# Patient Record
Sex: Female | Born: 1945 | Race: White | Hispanic: No | Marital: Married | Smoking: Never smoker
Health system: Southern US, Community
[De-identification: ages and names within clinical notes are randomized; demographics above are authoritative.]

## PROBLEM LIST (undated history)

## (undated) DIAGNOSIS — K5730 Diverticulosis of large intestine without perforation or abscess without bleeding: Secondary | ICD-10-CM

## (undated) DIAGNOSIS — K219 Gastro-esophageal reflux disease without esophagitis: Secondary | ICD-10-CM

## (undated) DIAGNOSIS — C50919 Malignant neoplasm of unspecified site of unspecified female breast: Secondary | ICD-10-CM

## (undated) DIAGNOSIS — L309 Dermatitis, unspecified: Secondary | ICD-10-CM

## (undated) DIAGNOSIS — E785 Hyperlipidemia, unspecified: Secondary | ICD-10-CM

## (undated) DIAGNOSIS — N813 Complete uterovaginal prolapse: Secondary | ICD-10-CM

## (undated) DIAGNOSIS — J3800 Paralysis of vocal cords and larynx, unspecified: Secondary | ICD-10-CM

## (undated) DIAGNOSIS — K5710 Diverticulosis of small intestine without perforation or abscess without bleeding: Secondary | ICD-10-CM

## (undated) DIAGNOSIS — M1990 Unspecified osteoarthritis, unspecified site: Secondary | ICD-10-CM

## (undated) DIAGNOSIS — Z87442 Personal history of urinary calculi: Secondary | ICD-10-CM

## (undated) DIAGNOSIS — N8110 Cystocele, unspecified: Secondary | ICD-10-CM

## (undated) DIAGNOSIS — J302 Other seasonal allergic rhinitis: Secondary | ICD-10-CM

## (undated) DIAGNOSIS — F411 Generalized anxiety disorder: Secondary | ICD-10-CM

## (undated) DIAGNOSIS — M419 Scoliosis, unspecified: Secondary | ICD-10-CM

## (undated) DIAGNOSIS — F32A Depression, unspecified: Secondary | ICD-10-CM

## (undated) DIAGNOSIS — F410 Panic disorder [episodic paroxysmal anxiety] without agoraphobia: Secondary | ICD-10-CM

## (undated) DIAGNOSIS — K5790 Diverticulosis of intestine, part unspecified, without perforation or abscess without bleeding: Secondary | ICD-10-CM

## (undated) DIAGNOSIS — E039 Hypothyroidism, unspecified: Secondary | ICD-10-CM

## (undated) DIAGNOSIS — H18453 Nodular corneal degeneration, bilateral: Secondary | ICD-10-CM

## (undated) DIAGNOSIS — C801 Malignant (primary) neoplasm, unspecified: Secondary | ICD-10-CM

## (undated) DIAGNOSIS — Z860101 Personal history of adenomatous and serrated colon polyps: Secondary | ICD-10-CM

## (undated) DIAGNOSIS — K589 Irritable bowel syndrome without diarrhea: Secondary | ICD-10-CM

## (undated) DIAGNOSIS — Z8719 Personal history of other diseases of the digestive system: Secondary | ICD-10-CM

## (undated) DIAGNOSIS — H04123 Dry eye syndrome of bilateral lacrimal glands: Secondary | ICD-10-CM

## (undated) DIAGNOSIS — I10 Essential (primary) hypertension: Secondary | ICD-10-CM

## (undated) DIAGNOSIS — N200 Calculus of kidney: Secondary | ICD-10-CM

## (undated) DIAGNOSIS — J309 Allergic rhinitis, unspecified: Secondary | ICD-10-CM

## (undated) DIAGNOSIS — D126 Benign neoplasm of colon, unspecified: Secondary | ICD-10-CM

## (undated) DIAGNOSIS — K222 Esophageal obstruction: Secondary | ICD-10-CM

## (undated) DIAGNOSIS — F329 Major depressive disorder, single episode, unspecified: Secondary | ICD-10-CM

## (undated) HISTORY — PX: DILATION AND CURETTAGE OF UTERUS: SHX78

## (undated) HISTORY — DX: Essential (primary) hypertension: I10

## (undated) HISTORY — PX: TONSILLECTOMY: SUR1361

## (undated) HISTORY — DX: Dermatitis, unspecified: L30.9

## (undated) HISTORY — DX: Benign neoplasm of colon, unspecified: D12.6

## (undated) HISTORY — DX: Unspecified osteoarthritis, unspecified site: M19.90

## (undated) HISTORY — DX: Diverticulosis of intestine, part unspecified, without perforation or abscess without bleeding: K57.90

## (undated) HISTORY — PX: TUBAL LIGATION: SHX77

## (undated) HISTORY — PX: COLONOSCOPY: SHX174

## (undated) HISTORY — DX: Esophageal obstruction: K22.2

## (undated) HISTORY — PX: HIATAL HERNIA REPAIR: SHX195

## (undated) HISTORY — DX: Allergic rhinitis, unspecified: J30.9

## (undated) HISTORY — DX: Hyperlipidemia, unspecified: E78.5

## (undated) HISTORY — DX: Malignant neoplasm of unspecified site of unspecified female breast: C50.919

## (undated) HISTORY — DX: Panic disorder (episodic paroxysmal anxiety): F41.0

## (undated) HISTORY — DX: Depression, unspecified: F32.A

## (undated) HISTORY — DX: Major depressive disorder, single episode, unspecified: F32.9

## (undated) HISTORY — DX: Hypothyroidism, unspecified: E03.9

## (undated) HISTORY — DX: Paralysis of vocal cords and larynx, unspecified: J38.00

## (undated) HISTORY — DX: Diverticulosis of small intestine without perforation or abscess without bleeding: K57.10

## (undated) HISTORY — PX: UPPER GASTROINTESTINAL ENDOSCOPY: SHX188

## (undated) HISTORY — PX: KNEE SURGERY: SHX244

## (undated) HISTORY — DX: Gastro-esophageal reflux disease without esophagitis: K21.9

## (undated) HISTORY — DX: Calculus of kidney: N20.0

## (undated) HISTORY — PX: OTHER SURGICAL HISTORY: SHX169

---
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
## 1991-01-28 DIAGNOSIS — J3800 Paralysis of vocal cords and larynx, unspecified: Secondary | ICD-10-CM

## 1991-01-28 DIAGNOSIS — L309 Dermatitis, unspecified: Secondary | ICD-10-CM

## 1991-01-28 DIAGNOSIS — J3801 Paralysis of vocal cords and larynx, unilateral: Secondary | ICD-10-CM

## 1991-01-28 HISTORY — DX: Dermatitis, unspecified: L30.9

## 1991-01-28 HISTORY — DX: Paralysis of vocal cords and larynx, unspecified: J38.00

## 1991-01-28 HISTORY — DX: Paralysis of vocal cords and larynx, unilateral: J38.01

## 1997-08-15 ENCOUNTER — Other Ambulatory Visit: Admission: RE | Admit: 1997-08-15 | Discharge: 1997-08-15 | Payer: Self-pay | Admitting: Obstetrics and Gynecology

## 1998-01-27 DIAGNOSIS — H04123 Dry eye syndrome of bilateral lacrimal glands: Secondary | ICD-10-CM

## 1998-01-27 HISTORY — DX: Dry eye syndrome of bilateral lacrimal glands: H04.123

## 1998-07-17 ENCOUNTER — Other Ambulatory Visit: Admission: RE | Admit: 1998-07-17 | Discharge: 1998-07-17 | Payer: Self-pay | Admitting: Family Medicine

## 1999-06-17 ENCOUNTER — Encounter: Payer: Self-pay | Admitting: Family Medicine

## 1999-06-17 ENCOUNTER — Encounter: Admission: RE | Admit: 1999-06-17 | Discharge: 1999-06-17 | Payer: Self-pay | Admitting: Family Medicine

## 1999-08-14 ENCOUNTER — Other Ambulatory Visit: Admission: RE | Admit: 1999-08-14 | Discharge: 1999-08-14 | Payer: Self-pay | Admitting: Family Medicine

## 2000-06-19 ENCOUNTER — Encounter: Payer: Self-pay | Admitting: Family Medicine

## 2000-06-19 ENCOUNTER — Encounter: Admission: RE | Admit: 2000-06-19 | Discharge: 2000-06-19 | Payer: Self-pay | Admitting: Family Medicine

## 2000-07-01 ENCOUNTER — Other Ambulatory Visit: Admission: RE | Admit: 2000-07-01 | Discharge: 2000-07-01 | Payer: Self-pay | Admitting: Family Medicine

## 2001-06-23 ENCOUNTER — Encounter: Payer: Self-pay | Admitting: Family Medicine

## 2001-06-23 ENCOUNTER — Encounter: Admission: RE | Admit: 2001-06-23 | Discharge: 2001-06-23 | Payer: Self-pay | Admitting: Family Medicine

## 2001-07-08 ENCOUNTER — Other Ambulatory Visit: Admission: RE | Admit: 2001-07-08 | Discharge: 2001-07-08 | Payer: Self-pay | Admitting: Family Medicine

## 2001-07-23 ENCOUNTER — Encounter: Payer: Self-pay | Admitting: Family Medicine

## 2001-07-23 ENCOUNTER — Encounter: Admission: RE | Admit: 2001-07-23 | Discharge: 2001-07-23 | Payer: Self-pay | Admitting: Family Medicine

## 2002-07-05 ENCOUNTER — Encounter: Admission: RE | Admit: 2002-07-05 | Discharge: 2002-07-05 | Payer: Self-pay | Admitting: Family Medicine

## 2002-07-05 ENCOUNTER — Encounter: Payer: Self-pay | Admitting: Family Medicine

## 2002-07-18 ENCOUNTER — Other Ambulatory Visit: Admission: RE | Admit: 2002-07-18 | Discharge: 2002-07-18 | Payer: Self-pay | Admitting: Family Medicine

## 2003-01-02 ENCOUNTER — Encounter: Admission: RE | Admit: 2003-01-02 | Discharge: 2003-01-02 | Payer: Self-pay | Admitting: Family Medicine

## 2003-02-02 ENCOUNTER — Ambulatory Visit (HOSPITAL_COMMUNITY): Admission: RE | Admit: 2003-02-02 | Discharge: 2003-02-02 | Payer: Self-pay | Admitting: Internal Medicine

## 2003-02-02 HISTORY — PX: ESOPHAGOGASTRODUODENOSCOPY: SHX1529

## 2003-07-17 ENCOUNTER — Ambulatory Visit (HOSPITAL_COMMUNITY): Admission: RE | Admit: 2003-07-17 | Discharge: 2003-07-17 | Payer: Self-pay | Admitting: Family Medicine

## 2003-08-25 ENCOUNTER — Other Ambulatory Visit: Admission: RE | Admit: 2003-08-25 | Discharge: 2003-08-25 | Payer: Self-pay | Admitting: Family Medicine

## 2004-07-18 ENCOUNTER — Ambulatory Visit (HOSPITAL_COMMUNITY): Admission: RE | Admit: 2004-07-18 | Discharge: 2004-07-18 | Payer: Self-pay | Admitting: Family Medicine

## 2004-08-16 ENCOUNTER — Ambulatory Visit: Payer: Self-pay | Admitting: Internal Medicine

## 2004-08-26 ENCOUNTER — Other Ambulatory Visit: Admission: RE | Admit: 2004-08-26 | Discharge: 2004-08-26 | Payer: Self-pay | Admitting: Family Medicine

## 2004-09-06 ENCOUNTER — Encounter (INDEPENDENT_AMBULATORY_CARE_PROVIDER_SITE_OTHER): Payer: Self-pay | Admitting: *Deleted

## 2004-09-06 ENCOUNTER — Ambulatory Visit: Payer: Self-pay | Admitting: Internal Medicine

## 2004-09-06 DIAGNOSIS — D126 Benign neoplasm of colon, unspecified: Secondary | ICD-10-CM

## 2004-09-06 HISTORY — DX: Benign neoplasm of colon, unspecified: D12.6

## 2005-07-22 ENCOUNTER — Ambulatory Visit (HOSPITAL_COMMUNITY): Admission: RE | Admit: 2005-07-22 | Discharge: 2005-07-22 | Payer: Self-pay | Admitting: Family Medicine

## 2005-09-05 ENCOUNTER — Other Ambulatory Visit: Admission: RE | Admit: 2005-09-05 | Discharge: 2005-09-05 | Payer: Self-pay | Admitting: Family Medicine

## 2005-09-11 ENCOUNTER — Encounter: Admission: RE | Admit: 2005-09-11 | Discharge: 2005-09-11 | Payer: Self-pay | Admitting: Family Medicine

## 2006-01-27 DIAGNOSIS — E785 Hyperlipidemia, unspecified: Secondary | ICD-10-CM

## 2006-01-27 DIAGNOSIS — K219 Gastro-esophageal reflux disease without esophagitis: Secondary | ICD-10-CM

## 2006-01-27 DIAGNOSIS — K222 Esophageal obstruction: Secondary | ICD-10-CM

## 2006-01-27 DIAGNOSIS — Z8719 Personal history of other diseases of the digestive system: Secondary | ICD-10-CM

## 2006-01-27 DIAGNOSIS — J309 Allergic rhinitis, unspecified: Secondary | ICD-10-CM

## 2006-01-27 HISTORY — DX: Esophageal obstruction: K22.2

## 2006-01-27 HISTORY — DX: Allergic rhinitis, unspecified: J30.9

## 2006-01-27 HISTORY — DX: Hyperlipidemia, unspecified: E78.5

## 2006-01-27 HISTORY — DX: Gastro-esophageal reflux disease without esophagitis: K21.9

## 2006-01-27 HISTORY — DX: Personal history of other diseases of the digestive system: Z87.19

## 2006-07-27 ENCOUNTER — Ambulatory Visit (HOSPITAL_COMMUNITY): Admission: RE | Admit: 2006-07-27 | Discharge: 2006-07-27 | Payer: Self-pay | Admitting: Family Medicine

## 2006-09-08 ENCOUNTER — Other Ambulatory Visit: Admission: RE | Admit: 2006-09-08 | Discharge: 2006-09-08 | Payer: Self-pay | Admitting: Family Medicine

## 2007-07-28 ENCOUNTER — Ambulatory Visit (HOSPITAL_COMMUNITY): Admission: RE | Admit: 2007-07-28 | Discharge: 2007-07-28 | Payer: Self-pay | Admitting: Family Medicine

## 2007-09-09 ENCOUNTER — Other Ambulatory Visit: Admission: RE | Admit: 2007-09-09 | Discharge: 2007-09-09 | Payer: Self-pay | Admitting: Family Medicine

## 2007-09-23 ENCOUNTER — Encounter: Admission: RE | Admit: 2007-09-23 | Discharge: 2007-09-23 | Payer: Self-pay | Admitting: Family Medicine

## 2008-08-02 ENCOUNTER — Ambulatory Visit (HOSPITAL_COMMUNITY): Admission: RE | Admit: 2008-08-02 | Discharge: 2008-08-02 | Payer: Self-pay | Admitting: Internal Medicine

## 2009-08-03 ENCOUNTER — Ambulatory Visit (HOSPITAL_COMMUNITY): Admission: RE | Admit: 2009-08-03 | Discharge: 2009-08-03 | Payer: Self-pay | Admitting: Internal Medicine

## 2009-09-11 ENCOUNTER — Encounter (INDEPENDENT_AMBULATORY_CARE_PROVIDER_SITE_OTHER): Payer: Self-pay | Admitting: *Deleted

## 2010-02-16 ENCOUNTER — Encounter: Payer: Self-pay | Admitting: Internal Medicine

## 2010-02-26 NOTE — Letter (Signed)
Summary: Colonoscopy Letter  [facility]  [address] [place], [place] [address]   Phone: [phone]  Fax: [phone]      September 11, 2009 MRN: [id number]   [facility] [address] [place], [place]  [address]   Dear Jacqueline Ortiz,   According to your medical record, it is time for you to schedule a Colonoscopy. The American Cancer Society recommends this procedure as a method to detect early colon cancer. Patients with a family history of colon cancer, or a personal history of colon polyps or inflammatory bowel disease are at increased risk.  This letter has beeen generated based on the recommendations made at the time of your procedure. If you feel that in your particular situation this may no longer apply, please contact our office.  Please call our office at [phone] to schedule this appointment or to update your records at your earliest convenience.  Thank you for cooperating with [place] to provide you with the very best care possible.   Sincerely,   Iva Boop, M.D.  [facility] [phone]

## 2010-07-25 ENCOUNTER — Other Ambulatory Visit (HOSPITAL_COMMUNITY): Payer: Self-pay | Admitting: Internal Medicine

## 2010-07-25 DIAGNOSIS — Z1231 Encounter for screening mammogram for malignant neoplasm of breast: Secondary | ICD-10-CM

## 2010-08-06 ENCOUNTER — Ambulatory Visit (HOSPITAL_COMMUNITY)
Admission: RE | Admit: 2010-08-06 | Discharge: 2010-08-06 | Disposition: A | Discharge status: Home / Self Care | Payer: 59 | Source: Ambulatory Visit | Attending: Internal Medicine | Admitting: Internal Medicine

## 2010-08-06 DIAGNOSIS — Z1231 Encounter for screening mammogram for malignant neoplasm of breast: Secondary | ICD-10-CM | POA: Insufficient documentation

## 2011-06-26 ENCOUNTER — Encounter: Payer: Self-pay | Admitting: Internal Medicine

## 2011-06-26 ENCOUNTER — Ambulatory Visit (INDEPENDENT_AMBULATORY_CARE_PROVIDER_SITE_OTHER): Payer: 59 | Admitting: Internal Medicine

## 2011-06-26 VITALS — BP 120/70 | HR 70 | Ht 64.5 in | Wt 120.6 lb

## 2011-06-26 DIAGNOSIS — Z860100 Personal history of colon polyps, unspecified: Secondary | ICD-10-CM | POA: Insufficient documentation

## 2011-06-26 DIAGNOSIS — K219 Gastro-esophageal reflux disease without esophagitis: Secondary | ICD-10-CM | POA: Insufficient documentation

## 2011-06-26 DIAGNOSIS — K222 Esophageal obstruction: Secondary | ICD-10-CM

## 2011-06-26 DIAGNOSIS — Z1211 Encounter for screening for malignant neoplasm of colon: Secondary | ICD-10-CM

## 2011-06-26 MED ORDER — PEG-KCL-NACL-NASULF-NA ASC-C 100 G PO SOLR: 1.0000 | Freq: Once | ORAL | Status: DC

## 2011-06-26 MED ORDER — PANTOPRAZOLE SODIUM 40 MG PO TBEC: 40.0000 mg | DELAYED_RELEASE_TABLET | Freq: Every day | ORAL | Status: DC

## 2011-06-26 NOTE — Patient Instructions (Signed)
You have been scheduled for a colonoscopy with propofol. Please follow written instructions given to you at your visit today.  Please pick up your prep kit at the pharmacy within the next 1-3 days.  Per Dr. Leone Payor please stop the ranitidine and start pantoprazole when it arrives from the mail order pharmacy.  Until then continue the ranitidine.

## 2011-06-26 NOTE — Progress Notes (Signed)
  Subjective:    Patient ID: Jacqueline Ortiz, female    DOB: Sep 12, 1945, 66 y.o.   MRN: [id number]  HPI This is a very pleasant middle-aged woman I know from previous colonoscopy and upper endoscopy. 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 She had been maintained on Protonix, but with insurance changes in the last few years that became too expensive so she switched to ranitidine 150 mg twice a day. She has heartburn despite taking that. Several weeks ago she was eating macaroni and cheese and had an acute esophageal obstruction problem, with hypersalivation and inability to pass the food bolus. She regurgitated it and was fine has not had any other problems. She also has a history of adenomatous colon polyp, in 2006. She has not yet scheduled for a routine repeat screening and surveillance colonoscopy. Her GI review of systems is otherwise notable for some IBS symptoms of alternating constipation and diarrhea.  Medications, allergies, past medical history, past surgical history, family history and social history are reviewed and updated in the EMR.   Review of Systems This is positive for chronic hoarseness with paralyzed vocal cord. Some anxiety. All other review of systems negative or as per the history of present illness.    Objective:   Physical Exam General:  Well-developed, well-nourished and in no acute distress Eyes:  anicteric. ENT:   Mouth and posterior pharynx free of lesions. Voice is hoarse Neck:   supple w/o thyromegaly or mass.  Lungs: Clear to auscultation bilaterally. There is kyphoscoliosis present Heart:  S1S2, no rubs, murmurs, gallops. Abdomen:  soft, non-tender, no hepatosplenomegaly, hernia, or mass and BS+.  Rectal: Deferred Lymph:  no cervical or supraclavicular adenopathy. Extremities:   no edema Skin   no rash. Neuro:  A&O x 3.  Psych:  appropriate mood and  Affect.   Data Reviewed: Prior colonoscopy and upper endoscopy reports  from 2005 and 2006, pathology report.       Assessment & Plan:   1. GERD with stricture   She has had a single episode dysphagia. I think placing her back on a PPI may take care of this. I prescribed pantoprazole 40 mg daily which is what she used to take. It appears to be on her formulary at this time. If she has more dysphagia she is to let me know.   2. Personal history of adenomatous colonic polyp   3. Special screening for malignant neoplasms, colon   Stoma 7 years since last colonoscopy with adenomatous polyp. It is appropriate time to have a surveillance and screening colonoscopy. The risks and benefits as well as alternatives of endoscopic procedure(s) have been discussed and reviewed. All questions answered. The patient agrees to proceed.    CC: Minda Meo, MD

## 2011-07-03 ENCOUNTER — Other Ambulatory Visit (HOSPITAL_COMMUNITY): Payer: Self-pay | Admitting: Internal Medicine

## 2011-07-03 DIAGNOSIS — Z1231 Encounter for screening mammogram for malignant neoplasm of breast: Secondary | ICD-10-CM

## 2011-08-07 ENCOUNTER — Ambulatory Visit (HOSPITAL_COMMUNITY)
Admission: RE | Admit: 2011-08-07 | Discharge: 2011-08-07 | Disposition: A | Discharge status: Home / Self Care | Payer: 59 | Source: Ambulatory Visit | Attending: Internal Medicine | Admitting: Internal Medicine

## 2011-08-07 DIAGNOSIS — Z1231 Encounter for screening mammogram for malignant neoplasm of breast: Secondary | ICD-10-CM | POA: Insufficient documentation

## 2011-09-19 ENCOUNTER — Ambulatory Visit (AMBULATORY_SURGERY_CENTER): Payer: 59 | Admitting: Internal Medicine

## 2011-09-19 ENCOUNTER — Encounter: Payer: Self-pay | Admitting: Internal Medicine

## 2011-09-19 VITALS — BP 154/82 | HR 111 | Temp 96.8°F | Resp 23 | Ht 64.0 in | Wt 120.0 lb

## 2011-09-19 DIAGNOSIS — Z860100 Personal history of colon polyps, unspecified: Secondary | ICD-10-CM

## 2011-09-19 DIAGNOSIS — D128 Benign neoplasm of rectum: Secondary | ICD-10-CM

## 2011-09-19 DIAGNOSIS — Z1211 Encounter for screening for malignant neoplasm of colon: Secondary | ICD-10-CM

## 2011-09-19 DIAGNOSIS — D126 Benign neoplasm of colon, unspecified: Secondary | ICD-10-CM

## 2011-09-19 DIAGNOSIS — D129 Benign neoplasm of anus and anal canal: Secondary | ICD-10-CM

## 2011-09-19 MED ORDER — SODIUM CHLORIDE 0.9 % IV SOLN: 500.0000 mL | INTRAVENOUS | Status: DC

## 2011-09-19 MED ORDER — PANTOPRAZOLE SODIUM 20 MG PO TBEC: 20.0000 mg | DELAYED_RELEASE_TABLET | Freq: Every day | ORAL | Status: DC

## 2011-09-19 NOTE — Op Note (Signed)
[facility] [address] [place], [address]   COLONOSCOPY PROCEDURE REPORT  PATIENT: Jacqueline, Ortiz  MR#: [id number] BIRTHDATE: December 17, 1945 , 65  yrs. old GENDER: Female ENDOSCOPIST: Iva Boop, MD, [facility] REFERRED BY: PROCEDURE DATE:  09/19/2011 PROCEDURE:   Colonoscopy with snare polypectomy ASA CLASS:   Class III INDICATIONS:screening and surveillance,personal history of colonic polyps. MEDICATIONS: propofol (Diprivan) 200mg  IV, MAC sedation, administered by CRNA, and These medications were titrated to patient response per physician's verbal order  DESCRIPTION OF PROCEDURE:   After the risks benefits and alternatives of the procedure were thoroughly explained, informed consent was obtained.  A digital rectal exam revealed no abnormalities of the rectum.   The LB CF-H180AL [id number]  endoscope was introduced through the anus and advanced to the cecum, which was identified by both the appendix and ileocecal valve. No adverse events experienced.   The quality of the prep was excellent, using MoviPrep  The instrument was then slowly withdrawn as the colon was fully examined.      COLON FINDINGS: A sessile polyp measuring 7 mm in size was found in the rectum.  A polypectomy was performed with a cold snare.  The resection was complete and the polyp tissue was completely retrieved.   Moderate diverticulosis was noted in the sigmoid colon.   The colon mucosa was otherwise normal.   Small internal hemorrhoids were found.  Retroflexed views revealed internal hemorrhoids. The time to cecum=4 minutes 21 seconds  Withdrawal time=12 minutes 55 seconds.  The scope was withdrawn and the procedure completed. COMPLICATIONS: There were no complications. ENDOSCOPIC IMPRESSION: 1.   Sessile polyp measuring 7 mm in size was found in the rectum; polypectomy was performed with a cold snare 2.   Moderate diverticulosis was noted in the sigmoid colon 3.   The colon mucosa  was otherwise normal 4.   Small internal hemorrhoids 5.   Personal history of diminutive adenoma in 2006  RECOMMENDATIONS: Timing of repeat colonoscopy will be determined by pathology findings.  eSigned:  Iva Boop, MD, [facility] 09/19/2011 2:22 PM   cc: Geoffry Paradise, MD and The Patient   PATIENT NAME:  Jacqueline, Ortiz MR#: [id number]

## 2011-09-19 NOTE — Patient Instructions (Addendum)
One small rectal polyp was removed today. It looks benign and I will let you know.  I am reducing the pantoprazole to 20 mg daily to see if lower dose controls heartburn and reflux. Please let me know if that does not work and you can go back to 40 mg daily.  Handouts for diverticulosis, hemorrhoids and polyps given.  Await pathology results to determine need for next colonoscopy.  Thank you for choosing [facility].  Iva Boop, MD, FACG  YOU HAD AN ENDOSCOPIC PROCEDURE TODAY AT THE [facility]: Refer to the procedure report that was given to you for any specific questions about what was found during the examination.  If the procedure report does not answer your questions, please call your gastroenterologist to clarify.  If you requested that your care partner not be given the details of your procedure findings, then the procedure report has been included in a sealed envelope for you to review at your convenience later.  YOU SHOULD EXPECT: Some feelings of bloating in the abdomen. Passage of more gas than usual.  Walking can help get rid of the air that was put into your GI tract during the procedure and reduce the bloating. If you had a lower endoscopy (such as a colonoscopy or flexible sigmoidoscopy) you may notice spotting of blood in your stool or on the toilet paper. If you underwent a bowel prep for your procedure, then you may not have a normal bowel movement for a few days.  DIET: Your first meal following the procedure should be a light meal and then it is ok to progress to your normal diet.  A half-sandwich or bowl of soup is an example of a good first meal.  Heavy or fried foods are harder to digest and may make you feel nauseous or bloated.  Likewise meals heavy in dairy and vegetables can cause extra gas to form and this can also increase the bloating.  Drink plenty of fluids but you should avoid alcoholic beverages for 24 hours.  ACTIVITY: Your care  partner should take you home directly after the procedure.  You should plan to take it easy, moving slowly for the rest of the day.  You can resume normal activity the day after the procedure however you should NOT DRIVE or use heavy machinery for 24 hours (because of the sedation medicines used during the test).    SYMPTOMS TO REPORT IMMEDIATELY: A gastroenterologist can be reached at any hour.  During normal business hours, 8:30 AM to 5:00 PM Monday through Friday, call [phone].  After hours and on weekends, please call the GI answering service at [phone] who will take a message and have the physician on call contact you.   Following lower endoscopy (colonoscopy or flexible sigmoidoscopy):  Excessive amounts of blood in the stool  Significant tenderness or worsening of abdominal pains  Swelling of the abdomen that is new, acute  Fever of 100F or higher  FOLLOW UP: If any biopsies were taken you will be contacted by phone or by letter within the next 1-3 weeks.  Call your gastroenterologist if you have not heard about the biopsies in 3 weeks.  Our staff will call the home number listed on your records the next business day following your procedure to check on you and address any questions or concerns that you may have at that time regarding the information given to you following your procedure. This is a courtesy call and so if  there is no answer at the home number and we have not heard from you through the emergency physician on call, we will assume that you have returned to your regular daily activities without incident.  SIGNATURES/CONFIDENTIALITY: You and/or your care partner have signed paperwork which will be entered into your electronic medical record.  These signatures attest to the fact that that the information above on your After Visit Summary has been reviewed and is understood.  Full responsibility of the confidentiality of this discharge information lies with you and/or  your care-partner.

## 2011-09-19 NOTE — Progress Notes (Signed)
The pt tolerated the colonoscopy very well. Maw   

## 2011-09-19 NOTE — Progress Notes (Signed)
Patient did not experience any of the following events: a burn prior to discharge; a fall within the facility; wrong site/side/patient/procedure/implant event; or a hospital transfer or hospital admission upon discharge from the facility. (G8907) Patient did not have preoperative order for IV antibiotic SSI prophylaxis. (G8918)  

## 2011-09-22 ENCOUNTER — Telehealth: Payer: Self-pay | Admitting: *Deleted

## 2011-09-22 NOTE — Telephone Encounter (Signed)
  Follow up Call-  Call back number 09/19/2011  Post procedure Call Back phone  # [phone]  Permission to leave phone message Yes     Patient questions:  Do you have a fever, pain , or abdominal swelling? no Pain Score  0 *  Have you tolerated food without any problems? yes  Have you been able to return to your normal activities? yes  Do you have any questions about your discharge instructions: Diet   no Medications  no Follow up visit  no  Do you have questions or concerns about your Care? no  Actions: * If pain score is 4 or above: No action needed, pain <4.

## 2011-09-29 ENCOUNTER — Encounter: Payer: Self-pay | Admitting: Internal Medicine

## 2011-09-29 NOTE — Progress Notes (Signed)
Quick Note:  tv adenoma 7 mm Recall colon 09/2014 ______

## 2012-07-05 ENCOUNTER — Other Ambulatory Visit (HOSPITAL_COMMUNITY): Payer: Self-pay | Admitting: Internal Medicine

## 2012-07-05 DIAGNOSIS — Z1231 Encounter for screening mammogram for malignant neoplasm of breast: Secondary | ICD-10-CM

## 2012-08-09 ENCOUNTER — Ambulatory Visit (HOSPITAL_COMMUNITY)
Admission: RE | Admit: 2012-08-09 | Discharge: 2012-08-09 | Disposition: A | Discharge status: Home / Self Care | Payer: 59 | Source: Ambulatory Visit | Attending: Internal Medicine | Admitting: Internal Medicine

## 2012-08-09 DIAGNOSIS — Z1231 Encounter for screening mammogram for malignant neoplasm of breast: Secondary | ICD-10-CM | POA: Insufficient documentation

## 2012-08-11 ENCOUNTER — Other Ambulatory Visit: Payer: Self-pay | Admitting: Internal Medicine

## 2012-08-11 DIAGNOSIS — R928 Other abnormal and inconclusive findings on diagnostic imaging of breast: Secondary | ICD-10-CM

## 2012-08-23 ENCOUNTER — Ambulatory Visit
Admission: RE | Admit: 2012-08-23 | Discharge: 2012-08-23 | Disposition: A | Discharge status: Home / Self Care | Payer: 59 | Source: Ambulatory Visit | Attending: Internal Medicine | Admitting: Internal Medicine

## 2012-08-23 DIAGNOSIS — R928 Other abnormal and inconclusive findings on diagnostic imaging of breast: Secondary | ICD-10-CM

## 2013-06-14 ENCOUNTER — Ambulatory Visit (INDEPENDENT_AMBULATORY_CARE_PROVIDER_SITE_OTHER): Payer: 59

## 2013-06-14 VITALS — BP 117/68 | HR 86 | Resp 14 | Ht 63.5 in | Wt 120.0 lb

## 2013-06-14 DIAGNOSIS — B070 Plantar wart: Secondary | ICD-10-CM

## 2013-06-14 DIAGNOSIS — Q828 Other specified congenital malformations of skin: Secondary | ICD-10-CM

## 2013-06-14 NOTE — Progress Notes (Signed)
   Subjective:    Patient ID: Jacqueline Ortiz, female    DOB: 23-Aug-1945, 68 y.o.   MRN: [id number]  HPI Comments: N callous L left plantar 2,3 rd MPJ D couple months O on and off years C hard skin, hard cores and pain A walking, long periods of weight bearing T home pedicure, antibiotic ointment      Review of Systems  HENT: Positive for sinus pressure.   Musculoskeletal: Positive for back pain.  All other systems reviewed and are negative.      Objective:   Physical Exam Neurovascular status is intact pedal pulses palpable DP and PT posterior were for capillary refill time 3 seconds all digits skin temperature is warm turgor normal no edema rubor pallor or varicosities noted. Neurologically epicritic and proprioceptive sensations intact and symmetric bilateral. Neurologically there is new the keratotic lesion subsecond MTP area left with 2 dictations or. Verrucoid lesions present there is diffuse keratoses subsecond right and pinch callus both hallux as well patient has atrophy the plantar fat pad with diffuse keratoses has been doing self-care as of debridement with temporary success. 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

## 2013-06-14 NOTE — Patient Instructions (Signed)

## 2013-10-18 ENCOUNTER — Other Ambulatory Visit: Payer: Self-pay

## 2013-10-18 DIAGNOSIS — Z1231 Encounter for screening mammogram for malignant neoplasm of breast: Secondary | ICD-10-CM

## 2013-10-20 ENCOUNTER — Ambulatory Visit
Admission: RE | Admit: 2013-10-20 | Discharge: 2013-10-20 | Disposition: A | Discharge status: Home / Self Care | Payer: 59 | Source: Ambulatory Visit

## 2013-10-20 DIAGNOSIS — Z1231 Encounter for screening mammogram for malignant neoplasm of breast: Secondary | ICD-10-CM

## 2014-09-28 ENCOUNTER — Other Ambulatory Visit (HOSPITAL_COMMUNITY): Payer: Self-pay | Admitting: Internal Medicine

## 2014-09-28 DIAGNOSIS — Z1231 Encounter for screening mammogram for malignant neoplasm of breast: Secondary | ICD-10-CM

## 2014-10-09 ENCOUNTER — Encounter: Payer: Self-pay | Admitting: Internal Medicine

## 2014-10-12 ENCOUNTER — Encounter: Payer: Self-pay | Admitting: Internal Medicine

## 2014-10-23 ENCOUNTER — Ambulatory Visit (HOSPITAL_COMMUNITY)
Admission: RE | Admit: 2014-10-23 | Discharge: 2014-10-23 | Disposition: A | Discharge status: Home / Self Care | Payer: Medicare Other | Source: Ambulatory Visit | Attending: Internal Medicine | Admitting: Internal Medicine

## 2014-10-23 DIAGNOSIS — Z1231 Encounter for screening mammogram for malignant neoplasm of breast: Secondary | ICD-10-CM | POA: Diagnosis not present

## 2014-10-27 ENCOUNTER — Encounter: Payer: Self-pay | Admitting: Internal Medicine

## 2014-10-27 ENCOUNTER — Other Ambulatory Visit: Payer: Self-pay | Admitting: Internal Medicine

## 2014-10-27 DIAGNOSIS — R928 Other abnormal and inconclusive findings on diagnostic imaging of breast: Secondary | ICD-10-CM

## 2014-11-06 ENCOUNTER — Ambulatory Visit
Admission: RE | Admit: 2014-11-06 | Discharge: 2014-11-06 | Disposition: A | Discharge status: Home / Self Care | Payer: Medicare Other | Source: Ambulatory Visit | Attending: Internal Medicine | Admitting: Internal Medicine

## 2014-11-06 DIAGNOSIS — R928 Other abnormal and inconclusive findings on diagnostic imaging of breast: Secondary | ICD-10-CM

## 2014-11-14 ENCOUNTER — Ambulatory Visit (AMBULATORY_SURGERY_CENTER): Payer: Self-pay

## 2014-11-14 VITALS — Ht 63.0 in | Wt 117.6 lb

## 2014-11-14 DIAGNOSIS — Z860100 Personal history of colon polyps, unspecified: Secondary | ICD-10-CM

## 2014-11-14 NOTE — Progress Notes (Signed)
No allergies to eggs or soy No diet/weight loss meds No home oxygen No past problems with anesthesia except woke up "shaking" after general anesthesia (?cold)  Has email  Refused emmi; "i've had it before"

## 2014-11-29 ENCOUNTER — Ambulatory Visit (AMBULATORY_SURGERY_CENTER): Payer: Medicare Other | Admitting: Internal Medicine

## 2014-11-29 ENCOUNTER — Encounter: Payer: Self-pay | Admitting: Internal Medicine

## 2014-11-29 VITALS — BP 127/77 | HR 87 | Temp 98.1°F | Resp 42 | Ht 63.0 in | Wt 120.0 lb

## 2014-11-29 DIAGNOSIS — Z86010 Personal history of colonic polyps: Secondary | ICD-10-CM

## 2014-11-29 MED ORDER — SODIUM CHLORIDE 0.9 % IV SOLN: 500.0000 mL | INTRAVENOUS | Status: DC

## 2014-11-29 NOTE — Patient Instructions (Addendum)
No polyps today!  I appreciate the opportunity to care for you. Gatha Mayer, MD, FACG     YOU HAD AN ENDOSCOPIC PROCEDURE TODAY AT [facility]:   Refer to the procedure report that was given to you for any specific questions about what was found during the examination.  If the procedure report does not answer your questions, please call your gastroenterologist to clarify.  If you requested that your care partner not be given the details of your procedure findings, then the procedure report has been included in a sealed envelope for you to review at your convenience later.  YOU SHOULD EXPECT: Some feelings of bloating in the abdomen. Passage of more gas than usual.  Walking can help get rid of the air that was put into your GI tract during the procedure and reduce the bloating. If you had a lower endoscopy (such as a colonoscopy or flexible sigmoidoscopy) you may notice spotting of blood in your stool or on the toilet paper. If you underwent a bowel prep for your procedure, you may not have a normal bowel movement for a few days.  Please Note:  You might notice some irritation and congestion in your nose or some drainage.  This is from the oxygen used during your procedure.  There is no need for concern and it should clear up in a day or so.  SYMPTOMS TO REPORT IMMEDIATELY:   Following lower endoscopy (colonoscopy or flexible sigmoidoscopy):  Excessive amounts of blood in the stool  Significant tenderness or worsening of abdominal pains  Swelling of the abdomen that is new, acute  Fever of 100F or higher  For urgent or emergent issues, a gastroenterologist can be reached at any hour by calling [phone].   DIET: Your first meal following the procedure should be a small meal and then it is ok to progress to your normal diet. Heavy or fried foods are harder to digest and may make you feel nauseous or bloated.  Likewise, meals heavy in dairy and vegetables  can increase bloating.  Drink plenty of fluids but you should avoid alcoholic beverages for 24 hours.  ACTIVITY:  You should plan to take it easy for the rest of today and you should NOT DRIVE or use heavy machinery until tomorrow (because of the sedation medicines used during the test).    FOLLOW UP: Our staff will call the number listed on your records the next business day following your procedure to check on you and address any questions or concerns that you may have regarding the information given to you following your procedure. If we do not reach you, we will leave a message.  However, if you are feeling well and you are not experiencing any problems, there is no need to return our call.  We will assume that you have returned to your regular daily activities without incident.  If any biopsies were taken you will be contacted by phone or by letter within the next 1-3 weeks.  Please call us at [phone] if you have not heard about the biopsies in 3 weeks.    SIGNATURES/CONFIDENTIALITY: You and/or your care partner have signed paperwork which will be entered into your electronic medical record.  These signatures attest to the fact that that the information above on your After Visit Summary has been reviewed and is understood.  Full responsibility of the confidentiality of this discharge information lies with you and/or your care-partner.  Handout was given to your care partner on diverticulosis. You may resume your current medications today. Please call if any questions or concerns.

## 2014-11-29 NOTE — Progress Notes (Signed)
No problems noted in the recovery room. maw 

## 2014-11-29 NOTE — Progress Notes (Signed)
Report to PACU, RN, vss, BBS= Clear.  

## 2014-11-29 NOTE — Op Note (Signed)
[facility]  [address]. [place], [address]   COLONOSCOPY PROCEDURE REPORT  PATIENT: Jacqueline, Ortiz  MR#: [id number] BIRTHDATE: 11/23/1945 , 88  yrs. old GENDER: female ENDOSCOPIST: Gatha Mayer, MD, [facility] PROCEDURE DATE:  11/29/2014 PROCEDURE:   Colonoscopy, surveillance First Screening Colonoscopy - Avg.  risk and is 50 yrs.  old or older - No.  Prior Negative Screening - Now for repeat screening. N/A  History of Adenoma - Now for follow-up colonoscopy & has been > or = to 3 yrs.  Yes hx of adenoma.  Has been 3 or more years since last colonoscopy.  Polyps removed today? No Recommend repeat exam, <10 yrs? Yes high risk ASA CLASS:   Class II INDICATIONS:Surveillance due to prior colonic neoplasia and PH Colon Adenoma. MEDICATIONS: Propofol 200 mg IV and Monitored anesthesia care  DESCRIPTION OF PROCEDURE:   After the risks benefits and alternatives of the procedure were thoroughly explained, informed consent was obtained.  The digital rectal exam revealed no abnormalities of the rectum.   The LB PFC-H190 [id number]  endoscope was introduced through the anus and advanced to the cecum, which was identified by both the appendix and ileocecal valve. No adverse events experienced.   The quality of the prep was good.  (MiraLax was used)  The instrument was then slowly withdrawn as the colon was fully examined. Estimated blood loss is zero unless otherwise noted in this procedure report.      COLON FINDINGS: There was diverticulosis noted in the sigmoid colon. The examination was otherwise normal.  Retroflexed views revealed no abnormalities. The time to cecum = 3.8 Withdrawal time = 7.9 The scope was withdrawn and the procedure completed. COMPLICATIONS: There were no immediate complications.  ENDOSCOPIC IMPRESSION: 1.   Diverticulosis was noted in the sigmoid colon 2.   The examination was otherwise normal - good prep  RECOMMENDATIONS: Repeat colonoscopy  in 5 years.  2021 - she has hx adenomas 2006 and 2013  eSigned:  Gatha Mayer, MD, [facility] 11/29/2014 2:51 PM   cc: the Patient and Dr. Burnard Bunting

## 2014-11-30 ENCOUNTER — Telehealth: Payer: Self-pay | Admitting: Emergency Medicine

## 2014-11-30 NOTE — Telephone Encounter (Signed)
  Follow up Call-  Call back number 11/29/2014  Post procedure Call Back phone  # [phone]  Permission to leave phone message Yes     Patient questions:  Do you have a fever, pain , or abdominal swelling? Yes.   Pain Score  2 *  Have you tolerated food without any problems? Yes.    Have you been able to return to your normal activities? Yes.    Do you have any questions about your discharge instructions: Diet   No. Medications  No. Follow up visit  No.  Do you have questions or concerns about your Care? No.  Actions: * If pain score is 4 or above: No action needed, pain <4.  Pt c/o gas pain 2/10. Denies n/v or fever. Instructed to call if sx's worsen

## 2015-01-28 DIAGNOSIS — N200 Calculus of kidney: Secondary | ICD-10-CM

## 2015-01-28 HISTORY — DX: Calculus of kidney: N20.0

## 2015-10-15 ENCOUNTER — Other Ambulatory Visit: Payer: Self-pay | Admitting: Internal Medicine

## 2015-10-15 DIAGNOSIS — Z1231 Encounter for screening mammogram for malignant neoplasm of breast: Secondary | ICD-10-CM

## 2015-10-21 ENCOUNTER — Encounter (HOSPITAL_COMMUNITY): Payer: Self-pay | Admitting: Emergency Medicine

## 2015-10-21 ENCOUNTER — Emergency Department (HOSPITAL_COMMUNITY)
Admission: EM | Admit: 2015-10-21 | Discharge: 2015-10-21 | Disposition: A | Discharge status: Home / Self Care | Payer: Medicare Other | Attending: Emergency Medicine | Admitting: Emergency Medicine

## 2015-10-21 ENCOUNTER — Encounter (HOSPITAL_COMMUNITY)
Admission: EM | Disposition: A | Discharge status: Home / Self Care | Payer: Self-pay | Source: Home / Self Care | Attending: Emergency Medicine

## 2015-10-21 ENCOUNTER — Emergency Department (HOSPITAL_COMMUNITY): Payer: Medicare Other

## 2015-10-21 DIAGNOSIS — Z5181 Encounter for therapeutic drug level monitoring: Secondary | ICD-10-CM | POA: Diagnosis not present

## 2015-10-21 DIAGNOSIS — K222 Esophageal obstruction: Secondary | ICD-10-CM | POA: Diagnosis present

## 2015-10-21 DIAGNOSIS — T18108A Unspecified foreign body in esophagus causing other injury, initial encounter: Secondary | ICD-10-CM

## 2015-10-21 DIAGNOSIS — Z79899 Other long term (current) drug therapy: Secondary | ICD-10-CM | POA: Insufficient documentation

## 2015-10-21 HISTORY — PX: ESOPHAGOGASTRODUODENOSCOPY: SHX5428

## 2015-10-21 LAB — COMPREHENSIVE METABOLIC PANEL
ALBUMIN: 4.6 g/dL (ref 3.5–5.0)
ALT: 30 U/L (ref 14–54)
AST: 30 U/L (ref 15–41)
Alkaline Phosphatase: 79 U/L (ref 38–126)
Anion gap: 12 (ref 5–15)
BILIRUBIN TOTAL: 0.6 mg/dL (ref 0.3–1.2)
BUN: 14 mg/dL (ref 6–20)
CO2: 22 mmol/L (ref 22–32)
Calcium: 9.5 mg/dL (ref 8.9–10.3)
Chloride: 109 mmol/L (ref 101–111)
Creatinine, Ser: 0.9 mg/dL (ref 0.44–1.00)
GFR calc Af Amer: >60 mL/min (ref >60)
GFR calc non Af Amer: >60 mL/min (ref >60)
GLUCOSE: 113 mg/dL — AB (ref 65–99)
POTASSIUM: 3.6 mmol/L (ref 3.5–5.1)
Sodium: 143 mmol/L (ref 135–145)
Total Protein: 8 g/dL (ref 6.5–8.1)

## 2015-10-21 LAB — CBC WITH DIFFERENTIAL/PLATELET
Basophils Absolute: 0 10*3/uL (ref 0.0–0.1)
Basophils Relative: 0 %
Eosinophils Absolute: 0 10*3/uL (ref 0.0–0.7)
Eosinophils Relative: 0 %
HEMATOCRIT: 46.8 % — AB (ref 36.0–46.0)
HEMOGLOBIN: 15.2 g/dL — AB (ref 12.0–15.0)
LYMPHS ABS: 1 10*3/uL (ref 0.7–4.0)
Lymphocytes Relative: 11 %
MCH: 29.7 pg (ref 26.0–34.0)
MCHC: 32.5 g/dL (ref 30.0–36.0)
MCV: 91.4 fL (ref 78.0–100.0)
MONOS PCT: 5 %
Monocytes Absolute: 0.5 10*3/uL (ref 0.1–1.0)
NEUTROS ABS: 7.4 10*3/uL (ref 1.7–7.7)
NEUTROS PCT: 84 %
Platelets: 244 10*3/uL (ref 150–400)
RBC: 5.12 MIL/uL — ABNORMAL HIGH (ref 3.87–5.11)
RDW: 14.1 % (ref 11.5–15.5)
WBC: 8.9 10*3/uL (ref 4.0–10.5)

## 2015-10-21 LAB — PROTIME-INR
INR: 1
Prothrombin Time: 13.2 seconds (ref 11.4–15.2)

## 2015-10-21 LAB — LIPASE, BLOOD: Lipase: 18 U/L (ref 11–51)

## 2015-10-21 LAB — TROPONIN I: Troponin I: <0.03 ng/mL (ref <0.03)

## 2015-10-21 SURGERY — EGD (ESOPHAGOGASTRODUODENOSCOPY)
Anesthesia: Moderate Sedation

## 2015-10-21 MED ORDER — BUTAMBEN-TETRACAINE-BENZOCAINE 2-2-14 % EX AERO
INHALATION_SPRAY | CUTANEOUS | Status: DC | PRN
  Administered 2015-10-21: 2 via TOPICAL

## 2015-10-21 MED ORDER — SODIUM CHLORIDE 0.9 % IV SOLN
Freq: Once | INTRAVENOUS | Status: AC
  Administered 2015-10-21: 14:00:00 via INTRAVENOUS

## 2015-10-21 MED ORDER — ONDANSETRON HCL 4 MG/2ML IJ SOLN
4.0000 mg | Freq: Once | INTRAMUSCULAR | Status: AC
  Administered 2015-10-21: 4 mg via INTRAVENOUS
  Filled 2015-10-21: qty 2

## 2015-10-21 MED ORDER — PANTOPRAZOLE SODIUM 20 MG PO TBEC: 20.0000 mg | DELAYED_RELEASE_TABLET | Freq: Two times a day (BID) | ORAL | 0 refills | Status: DC

## 2015-10-21 MED ORDER — FENTANYL CITRATE (PF) 100 MCG/2ML IJ SOLN
INTRAMUSCULAR | Status: DC | PRN
  Administered 2015-10-21: 12.5 ug via INTRAVENOUS
  Administered 2015-10-21: 25 ug via INTRAVENOUS

## 2015-10-21 MED ORDER — FAMOTIDINE IN NACL 20-0.9 MG/50ML-% IV SOLN
20.0000 mg | Freq: Once | INTRAVENOUS | Status: AC
  Administered 2015-10-21: 20 mg via INTRAVENOUS
  Filled 2015-10-21: qty 50

## 2015-10-21 MED ORDER — MIDAZOLAM HCL 5 MG/ML IJ SOLN
INTRAMUSCULAR | Status: AC
  Filled 2015-10-21: qty 2

## 2015-10-21 MED ORDER — MIDAZOLAM HCL 10 MG/2ML IJ SOLN
INTRAMUSCULAR | Status: DC | PRN
  Administered 2015-10-21: 1 mg via INTRAVENOUS
  Administered 2015-10-21: 2 mg via INTRAVENOUS

## 2015-10-21 MED ORDER — FENTANYL CITRATE (PF) 100 MCG/2ML IJ SOLN
INTRAMUSCULAR | Status: AC
  Filled 2015-10-21: qty 2

## 2015-10-21 NOTE — Discharge Instructions (Signed)
Continue taking Protonix daily, crushing pills and eating only very soft foods and small pieces. Schedule your follow up appointment with your GI doctor as you will still need another procedure for esophageal dilation.

## 2015-10-21 NOTE — ED Provider Notes (Signed)
[facility] DEPT Provider Note   CSN: [id number] Arrival date & time: 10/21/15  1006     History   Chief Complaint Chief Complaint  Patient presents with  . esphageal strincture    HPI Jacqueline Ortiz is a 70 y.o. female.  HPI  Patient reports she has had problems in the past with a hiatal hernia. She doesn't seem to have knowledge of esophageal stricture however this is documented in the EMR. She reports she has seen Dr. Carlean Purl in the past for GI problems. Patient reports that she swallowed an ibuprofen tablet yesterday morning and it got stuck in her throat. She reports she doesn't have any pain. She reports however she has not billed to swallow anything since. She reports everything will come back out. She states initially she was spitting out saliva but that stopped last night. She reports she still can't drink anything. Past Medical History:  Diagnosis Date  . Allergic rhinosinusitis   . Arthritis   . Depression   . Diverticulosis   . Duodenal diverticulum   . Dyslipidemia   . Eczema   . Esophageal stricture   . GERD (gastroesophageal reflux disease)   . Hemorrhoids    internal and external  . Nephrolithiasis   . Osteoporosis   . Panic disorder   . Paralyzed vocal cords   . Tubular adenoma of colon 09/06/2004   Dr. Silvano Rusk    Patient Active Problem List   Diagnosis Date Noted  . GERD with stricture 06/26/2011  . Personal history of adenomatous colonic polyp 06/26/2011    Past Surgical History:  Procedure Laterality Date  . CESAREAN SECTION    . COLONOSCOPY  multiple   Dr. Silvano Rusk  . DILATION AND CURETTAGE OF UTERUS    . ESOPHAGOGASTRODUODENOSCOPY  02/02/2003   Dr. Silvano Rusk  . KNEE SURGERY     right fx  . TONSILLECTOMY    . TUBAL LIGATION    . vocal cord surgery      OB History    No data available       Home Medications    Prior to Admission medications   Medication Sig Start Date End Date Taking? Authorizing Provider    atorvastatin (LIPITOR) 20 MG tablet Take 20 mg by mouth daily.  09/05/15  Yes Historical Provider, MD  Calcium-Magnesium-Vitamin D [id number] MG-MG-UNIT CHEW Chew 2 capsules by mouth 2 (two) times daily.   Yes Historical Provider, MD  pantoprazole (PROTONIX) 40 MG tablet Take 40 mg by mouth daily.   Yes Historical Provider, MD  atorvastatin (LIPITOR) 10 MG tablet Take 10 mg by mouth daily.    Historical Provider, MD  pantoprazole (PROTONIX) 20 MG tablet Take 1 tablet (20 mg total) by mouth daily. 09/19/11 09/18/12  Gatha Mayer, MD  pantoprazole (PROTONIX) 20 MG tablet Take 1 tablet (20 mg total) by mouth 2 (two) times daily. 10/21/15   Charlesetta Shanks, MD    Family History Family History  Problem Relation Age of Onset  . Heart disease Mother   . Diabetes Mother   . Heart disease Father   . Colon cancer Maternal Grandmother   . Colon polyps Brother   . Cancer      gastric    Social History Social History  Substance Use Topics  . Smoking status: Never Smoker  . Smokeless tobacco: Never Used  . Alcohol use No     Allergies   Review of patient's allergies indicates no known allergies.   Review  of Systems Review of Systems 10 Systems reviewed and are negative for acute change except as noted in the HPI.   Physical Exam Updated Vital Signs BP 123/75   Pulse 106   Temp 97.6 F (36.4 C) (Oral)   Resp 16   Ht 5\' 3"  (1.6 m)   Wt 120 lb (54.4 kg)   SpO2 94%   BMI 21.26 kg/m   Physical Exam  Constitutional: She appears well-developed and well-nourished. No distress.  HENT:  Head: Normocephalic and atraumatic.  Eyes: Conjunctivae are normal.  Neck: Neck supple.  Cardiovascular: Normal rate and regular rhythm.   No murmur heard. Pulmonary/Chest: Effort normal and breath sounds normal. No respiratory distress.  Abdominal: Soft. There is no tenderness.  Musculoskeletal: She exhibits no edema.  Neurological: She is alert.  Skin: Skin is warm and dry.  Psychiatric: She  has a normal mood and affect.  Nursing note and vitals reviewed.    ED Treatments / Results  Labs (all labs ordered are listed, but only abnormal results are displayed) Labs Reviewed  COMPREHENSIVE METABOLIC PANEL - Abnormal; Notable for the following:       Result Value   Glucose, Bld 113 (*)    All other components within normal limits  CBC WITH DIFFERENTIAL/PLATELET - Abnormal; Notable for the following:    RBC 5.12 (*)    Hemoglobin 15.2 (*)    HCT 46.8 (*)    All other components within normal limits  LIPASE, BLOOD  TROPONIN I  PROTIME-INR    EKG  EKG Interpretation None       Radiology Dg Chest 2 View  Result Date: 10/21/2015 CLINICAL DATA:  Dysphagia EXAM: CHEST  2 VIEW COMPARISON:  None. FINDINGS: Normal heart size. Moderate to large hiatal hernia. Otherwise normal mediastinal contour. No pneumothorax. No pleural effusion. Lungs appear clear, with no acute consolidative airspace disease and no pulmonary edema. IMPRESSION: No active cardiopulmonary disease. Moderate to large hiatal hernia. Electronically Signed   By: Ilona Sorrel M.D.   On: 10/21/2015 12:33    Procedures Procedures (including critical care time)  Medications Ordered in ED Medications  famotidine (PEPCID) IVPB 20 mg premix (0 mg Intravenous Stopped 10/21/15 1408)  0.9 %  sodium chloride infusion ( Intravenous New Bag/Given 10/21/15 1331)  ondansetron (ZOFRAN) injection 4 mg (4 mg Intravenous Given 10/21/15 1410)     Initial Impression / Assessment and Plan / ED Course  I have reviewed the triage vital signs and the nursing notes.  Pertinent labs & imaging results that were available during my care of the patient were reviewed by me and considered in my medical decision making (see chart for details).  Clinical Course   13:15 PO trial water. Took about 20 ml then felt fullness and started spitting out saliva and water about 110ml.  Consult: Dr. Ardis Hughs of [facility]. Patient has been seen and  esophageal foreign body removed. Final Clinical Impressions(s) / ED Diagnoses   Final diagnoses:  Esophageal foreign body, initial encounter  Esophageal stricture   Patient presents as outlined above. She had had a pill got stuck approximately 24 hours earlier and was unable to pass water. Diagnostic workup does not show acute dehydration or leukocytosis from prolonged foreign body. Patient reports it is an ibuprofen pill. She is not expressing serious pain to suggest esophageal perforation or erosion. Chest x-ray shows large hiatal hernia but no other acute anomaly. With by mouth trial, patient was unable to tolerate water. Dr. Ardis Hughs of GI  did perform the patient's endoscopy and identified still a residual pill obstructing the esophagus. He reports this fragmented and ultimately passed. He instructs the patient is to continue her Protonix and follow-up for a repeat endoscopy for subsequent dilation once immediate irritation from prolonged esophageal foreign body has resolved. New Prescriptions New Prescriptions   PANTOPRAZOLE (PROTONIX) 20 MG TABLET    Take 1 tablet (20 mg total) by mouth 2 (two) times daily.     Charlesetta Shanks, MD 10/21/15 [phone]

## 2015-10-21 NOTE — ED Notes (Signed)
Per Dr. Ardis Hughs pt can have PO fluids.

## 2015-10-21 NOTE — Op Note (Signed)
[facility] Patient Name: Jacqueline Ortiz Procedure Date: 10/21/2015 MRN: [id number] Attending MD: Milus Banister , MD Date of Birth: Jan 05, 1946 CSN: [id number] Age: 70 Admit Type: Inpatient Procedure:                Upper GI endoscopy Indications:              Dysphagia; acute dysphagia after advil pill                            yesterday (usually crushes advil but didn't                            yesterday), h/o esopahgeal stricture dilated                            remotely Dr. Carlean Purl Providers:                Milus Banister, MD, Vista Lawman, RN, Cherylynn Ridges, Technician Referring MD:              Medicines:                Fentanyl 37.5 micrograms IV, Midazolam 3 mg IV Complications:            No immediate complications. Estimated blood loss:                            None. Estimated Blood Loss:     Estimated blood loss: none. Procedure:                Pre-Anesthesia Assessment:                           - Prior to the procedure, a History and Physical                            was performed, and patient medications and                            allergies were reviewed. The patient's tolerance of                            previous anesthesia was also reviewed. The risks                            and benefits of the procedure and the sedation                            options and risks were discussed with the patient.                            All questions were answered, and informed consent                            was  obtained. Prior Anticoagulants: The patient has                            taken no previous anticoagulant or antiplatelet                            agents. ASA Grade Assessment: II - A patient with                            mild systemic disease. After reviewing the risks                            and benefits, the patient was deemed in                            satisfactory condition to undergo the  procedure.                           After obtaining informed consent, the endoscope was                            passed under direct vision. Throughout the                            procedure, the patient's blood pressure, pulse, and                            oxygen saturations were monitored continuously. The                            [id number]) scope was introduced through the                            mouth, and advanced to the second part of duodenum.                            The upper GI endoscopy was accomplished without                            difficulty. The patient tolerated the procedure                            well. Scope In: Scope Out: Findings:      There was copious fluid in the esophagus, this was suctioned out. A       partially dissolved white, chalky pill was lodged at GE junction site of       focal stricture. With very minor pressure on the scope, the pill was       easily fragmented into innumerable smaller pieces and then flushed into       the stomach. The scope passed into the stomach with minor resistence at       the site of the benign, focal, pepetic appearing stricture (lumen       8-59mm). There was obvious inflammation, minor oozing along the rim of  the stricture.      A large hiatal hernia was present, with typical resulting foreshortened       and tortuous esophagus.      The examination was otherwise normal. Impression:               - Partially dissolved pill was lodged at site of                            benign, focal, peptic appearing stricture at the GE                            junction above a large hiatal hernia. The pill was                            crushed with the scope and then flushed into the                            stomach. Moderate Sedation:      Moderate (conscious) sedation was administered by the endoscopy nurse       and supervised by the endoscopist. The following parameters were       monitored:  oxygen saturation, heart rate, blood pressure, and response       to care. Total physician intraservice time was 12 minutes. Recommendation:           - Patient has a contact number available for                            emergencies. The signs and symptoms of potential                            delayed complications were discussed with the                            patient. Return to normal activities tomorrow.                            Written discharge instructions were provided to the                            patient.                           - Resume previous diet. Chew your food well, eat                            slowly and take small bites.                           - Continue present medications. Crush your pills                            (as you had done previously. Continue protonix once  daily, shortly before dinner or breakfast meal.                           - Repeat upper endoscopy in 2 weeks for retreatment                            with Dr. Carlean Purl, [facility] will call to set this                            up. Procedure Code(s):        --- Professional ---                           [phone], Esophagogastroduodenoscopy, flexible,                            transoral; with removal of foreign body(s)                           99152, Moderate sedation services provided by the                            same physician or other qualified health care                            professional performing the diagnostic or                            therapeutic service that the sedation supports,                            requiring the presence of an independent trained                            observer to assist in the monitoring of the                            patient's level of consciousness and physiological                            status; initial 15 minutes of intraservice time,                            patient age 21 years or  older Diagnosis Code(s):        --- Professional ---                           [phone], Other foreign object in esophagus causing                            other injury, initial encounter                           K44.9, Diaphragmatic hernia without obstruction or  gangrene                           R13.10, Dysphagia, unspecified CPT copyright 2016 American Medical Association. All rights reserved. The codes documented in this report are preliminary and upon coder review may  be revised to meet current compliance requirements. Milus Banister, MD 10/21/2015 3:39:14 PM This report has been signed electronically. Number of Addenda: 0

## 2015-10-21 NOTE — ED Triage Notes (Signed)
Pt has hx of esophageal strincture. Yesterday she took an advil and feels like it got stuck in her esophagus. No difficulty breathing, but cannot get liquids down since yesterday.

## 2015-10-21 NOTE — H&P (Signed)
HPI: This is a 70 yo woman   Chief complaint is acute dysphagia. Since advil pill yesterday.  Cannot even keep down water but managing her secretions fine.  H/op esopahgeal stricture 2009 dilated Dr. Carlean Purl.  Chronic GERD, not worse lately.  No chronic dysphagia. She takes PPI once daily  ROS: complete GI ROS as described in HPI.  Constitutional:  No unintentional weight loss   Past Medical History:  Diagnosis Date  . Allergic rhinosinusitis   . Arthritis   . Depression   . Diverticulosis   . Duodenal diverticulum   . Dyslipidemia   . Eczema   . Esophageal stricture   . GERD (gastroesophageal reflux disease)   . Hemorrhoids    internal and external  . Nephrolithiasis   . Osteoporosis   . Panic disorder   . Paralyzed vocal cords   . Tubular adenoma of colon 09/06/2004   Dr. Silvano Rusk    Past Surgical History:  Procedure Laterality Date  . CESAREAN SECTION    . COLONOSCOPY  multiple   Dr. Silvano Rusk  . DILATION AND CURETTAGE OF UTERUS    . ESOPHAGOGASTRODUODENOSCOPY  02/02/2003   Dr. Silvano Rusk  . KNEE SURGERY     right fx  . TONSILLECTOMY    . TUBAL LIGATION    . vocal cord surgery      Current Facility-Administered Medications  Medication Dose Route Frequency Provider Last Rate Last Dose  . 0.9 %  sodium chloride infusion  500 mL Intravenous Continuous Gatha Mayer, MD       Current Outpatient Prescriptions  Medication Sig Dispense Refill  . atorvastatin (LIPITOR) 20 MG tablet Take 20 mg by mouth daily.     . Calcium-Magnesium-Vitamin D [id number] MG-MG-UNIT CHEW Chew 2 capsules by mouth 2 (two) times daily.    . pantoprazole (PROTONIX) 40 MG tablet Take 40 mg by mouth daily.    Marland Kitchen atorvastatin (LIPITOR) 10 MG tablet Take 10 mg by mouth daily.    . pantoprazole (PROTONIX) 20 MG tablet Take 1 tablet (20 mg total) by mouth daily. 90 tablet 3    Allergies as of 10/21/2015  . (No Known Allergies)    Family History  Problem Relation Age of Onset  .  Heart disease Mother   . Diabetes Mother   . Heart disease Father   . Colon cancer Maternal Grandmother   . Colon polyps Brother   . Cancer      gastric    Social History   Social History  . Marital status: Married    Spouse name: N/A  . Number of children: 1  . Years of education: N/A   Occupational History  .  Lab [employer]   Social History Main Topics  . Smoking status: Never Smoker  . Smokeless tobacco: Never Used  . Alcohol use No  . Drug use: No  . Sexual activity: Not on file   Other Topics Concern  . Not on file   Social History Narrative  . No narrative on file     Physical Exam: BP 155/94   Pulse 112   Temp 97.6 F (36.4 C) (Oral)   Resp 21   Ht 5\' 3"  (1.6 m)   Wt 54.4 kg (120 lb)   SpO2 95%   BMI 21.26 kg/m  Constitutional: generally well-appearing Psychiatric: alert and oriented x3 Abdomen: soft, nontender, nondistended, no obvious ascites, no peritoneal signs, normal bowel sounds No peripheral edema noted in lower extremities  Assessment and  plan: 70 y.o. female with acute dysphagia, ? Esophageal food, pill impaction, edema?  For EGD in ER now   Owens Loffler, MD [facility] 10/21/2015, 3:05 PM

## 2015-10-21 NOTE — ED Notes (Signed)
Patient transported to X-ray 

## 2015-10-21 NOTE — ED Notes (Signed)
Gastroenterologist at bedside.

## 2015-10-22 ENCOUNTER — Telehealth: Payer: Self-pay

## 2015-10-22 ENCOUNTER — Encounter (HOSPITAL_COMMUNITY): Payer: Self-pay | Admitting: Gastroenterology

## 2015-10-22 ENCOUNTER — Telehealth: Payer: Self-pay | Admitting: Internal Medicine

## 2015-10-22 NOTE — Telephone Encounter (Signed)
-----   Message from Gatha Mayer, MD sent at 10/22/2015  1:45 PM EDT ----- OK for direct [place] ----- Message ----- From: Marlon Pel, RN Sent: 10/22/2015   9:14 AM To: Gatha Mayer, MD  See note below.  OK for [place]? ----- Message ----- From: Milus Banister, MD Sent: 10/21/2015   3:41 PM To: Marlon Pel, RN  Hey,  She needs repeat EGD with dilation, Dr. Carlean Purl in next 2-3 weeks following EGD in ER today (pill was lodged at site of focal peptic stricture).   Thanks

## 2015-10-22 NOTE — Telephone Encounter (Signed)
Left message for patient to call back  

## 2015-10-23 NOTE — Telephone Encounter (Signed)
Patient is scheduled for EGD 11/13/15 and pre-visit 10/30/15

## 2015-10-23 NOTE — Telephone Encounter (Signed)
See other phone note for additional details.  

## 2015-10-29 ENCOUNTER — Ambulatory Visit
Admission: RE | Admit: 2015-10-29 | Discharge: 2015-10-29 | Disposition: A | Discharge status: Home / Self Care | Payer: Medicare Other | Source: Ambulatory Visit | Attending: Internal Medicine | Admitting: Internal Medicine

## 2015-10-29 DIAGNOSIS — Z1231 Encounter for screening mammogram for malignant neoplasm of breast: Secondary | ICD-10-CM

## 2015-10-30 ENCOUNTER — Encounter: Payer: Self-pay | Admitting: Internal Medicine

## 2015-10-30 ENCOUNTER — Ambulatory Visit (AMBULATORY_SURGERY_CENTER): Payer: Self-pay

## 2015-10-30 VITALS — Ht 63.0 in | Wt 119.4 lb

## 2015-10-30 DIAGNOSIS — K222 Esophageal obstruction: Secondary | ICD-10-CM

## 2015-10-30 NOTE — Progress Notes (Signed)
Per pt, no allergies to soy or egg products.Pt not taking any weight loss meds or using  O2 at home. 

## 2015-11-13 ENCOUNTER — Ambulatory Visit (AMBULATORY_SURGERY_CENTER): Payer: Medicare Other | Admitting: Internal Medicine

## 2015-11-13 ENCOUNTER — Encounter: Payer: Self-pay | Admitting: Internal Medicine

## 2015-11-13 VITALS — BP 133/77 | HR 90 | Temp 97.1°F | Resp 16 | Ht 63.0 in | Wt 119.0 lb

## 2015-11-13 DIAGNOSIS — K222 Esophageal obstruction: Secondary | ICD-10-CM | POA: Diagnosis present

## 2015-11-13 DIAGNOSIS — K449 Diaphragmatic hernia without obstruction or gangrene: Secondary | ICD-10-CM | POA: Diagnosis not present

## 2015-11-13 MED ORDER — SODIUM CHLORIDE 0.9 % IV SOLN: 500.0000 mL | INTRAVENOUS | Status: DC

## 2015-11-13 NOTE — Progress Notes (Signed)
Called to room to assist during endoscopic procedure.  Patient ID and intended procedure confirmed with present staff. Received instructions for my participation in the procedure from the performing physician.  

## 2015-11-13 NOTE — Progress Notes (Signed)
To recovery, report to Tyrell, RN, VSS. 

## 2015-11-13 NOTE — Op Note (Signed)
[facility] Patient Name: Jacqueline Ortiz Procedure Date: 11/13/2015 10:09 AM MRN: [id number] Endoscopist: Gatha Mayer , MD Age: 70 Referring MD:  Date of Birth: 11/22/1945 Gender: Female Account #: [id number] Procedure:                Upper GI endoscopy Indications:              Dysphagia, Suspected stricture of the esophagus,                            For therapy of esophageal stricture Medicines:                Propofol per Anesthesia, Monitored Anesthesia Care Procedure:                Pre-Anesthesia Assessment:                           - Prior to the procedure, a History and Physical                            was performed, and patient medications and                            allergies were reviewed. The patient's tolerance of                            previous anesthesia was also reviewed. The risks                            and benefits of the procedure and the sedation                            options and risks were discussed with the patient.                            All questions were answered, and informed consent                            was obtained. Prior Anticoagulants: The patient                            last took ibuprofen 1 day prior to the procedure.                            ASA Grade Assessment: II - A patient with mild                            systemic disease. After reviewing the risks and                            benefits, the patient was deemed in satisfactory                            condition to undergo the procedure.  After obtaining informed consent, the endoscope was                            passed under direct vision. Throughout the                            procedure, the patient's blood pressure, pulse, and                            oxygen saturations were monitored continuously. The                            Model [phone]) scope was introduced   through the mouth, and advanced to the duodenal                            bulb. The upper GI endoscopy was accomplished                            without difficulty. The patient tolerated the                            procedure well. Scope In: Scope Out: Findings:                 One severe (stenosis; an endoscope cannot pass)                            benign-appearing, intrinsic stenosis was found 30                            cm from the incisors. This measured 1.2 cm (inner                            diameter) and was traversed after dilation. A TTS                            dilator was passed through the scope. Dilation with                            a 13.5-14.5-15.5 mm balloon and a 16-17-18 mm                            balloon dilator was performed to 17 mm. The                            dilation site was examined and showed moderate                            improvement in luminal narrowing. Estimated blood                            loss was minimal.  A large hiatal hernia was present. seen on forward                            and retroflex views with J-shaped stomach and                            suspected paraesophageal hernia.                           An acquired extrinsic moderate stenosis was found                            in the duodenal bulb and was non-traversed. Believe                            this was from diaphragmatic impingement with                            paraesophageal hiatal hernia.                           The exam was otherwise without abnormality.                            retroflexion in stomach showed what is described                            above. Complications:            No immediate complications. Estimated Blood Loss:     Estimated blood loss was minimal. Impression:               - Benign-appearing esophageal stenosis. Dilated.                           - Large hiatal hernia.                            - Acquired duodenal stenosis.                           - The examination was otherwise normal.                           - No specimens collected. Recommendation:           - Patient has a contact number available for                            emergencies. The signs and symptoms of potential                            delayed complications were discussed with the                            patient. Return to normal activities tomorrow.  Written discharge instructions were provided to the                            patient.                           - Clear liquids x 1 hour then soft foods rest of                            day. Start prior diet tomorrow.                           - Continue present medications.                           -                           WILL SCHEDULE UPPER GI SERIES TO LOOK FOR SUSPECTED                            PARAESOPHAGEAL HIATAL HERNIA                           MAY BENEFIT FROM SURGICAL CORRECTION Gatha Mayer, MD 11/13/2015 10:52:13 AM This report has been signed electronically.

## 2015-11-13 NOTE — Patient Instructions (Addendum)
I dilated the narrowing of the esophagus today. I hope that helps. 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  I am going to schedule an xray called an upper GI series to evaluate this further - it may need surgical correction.  I appreciate the opportunity to care for you. Gatha Mayer, MD, FACG  YOU HAD AN ENDOSCOPIC PROCEDURE TODAY AT [facility]:   Refer to the procedure report that was given to you for any specific questions about what was found during the examination.  If the procedure report does not answer your questions, please call your gastroenterologist to clarify.  If you requested that your care partner not be given the details of your procedure findings, then the procedure report has been included in a sealed envelope for you to review at your convenience later.  YOU SHOULD EXPECT: Some feelings of bloating in the abdomen. Passage of more gas than usual.  Walking can help get rid of the air that was put into your GI tract during the procedure and reduce the bloating. If you had a lower endoscopy (such as a colonoscopy or flexible sigmoidoscopy) you may notice spotting of blood in your stool or on the toilet paper. If you underwent a bowel prep for your procedure, you may not have a normal bowel movement for a few days.  Please Note:  You might notice some irritation and congestion in your nose or some drainage.  This is from the oxygen used during your procedure.  There is no need for concern and it should clear up in a day or so.  SYMPTOMS TO REPORT IMMEDIATELY:    Following upper endoscopy (EGD)  Vomiting of blood or coffee ground material  New chest pain or pain under the shoulder blades  Painful or persistently difficult swallowing  New shortness of breath  Fever of 100F or higher  Black, tarry-looking stools  For urgent  or emergent issues, a gastroenterologist can be reached at any hour by calling [phone].   DIET:  CLEAR LIQUIDS  UNTIL 12:00.             THEN ONLY SOFT FOODS THE REMAINDER OF THE DAY.              RESUME YOUR REGULAR DIET IN THE AM.  ACTIVITY:  You should plan to take it easy for the rest of today and you should NOT DRIVE or use heavy machinery until tomorrow (because of the sedation medicines used during the test).    FOLLOW UP: Our staff will call the number listed on your records the next business day following your procedure to check on you and address any questions or concerns that you may have regarding the information given to you following your procedure. If we do not reach you, we will leave a message.  However, if you are feeling well and you are not experiencing any problems, there is no need to return our call.  We will assume that you have returned to your regular daily activities without incident.  If any biopsies were taken you will be contacted by phone or by letter within the next 1-3 weeks.  Please call us at [phone] if you have not heard about the biopsies in 3 weeks.    SIGNATURES/CONFIDENTIALITY: You and/or your care partner have signed paperwork which will be  entered into your electronic medical record.  These signatures attest to the fact that that the information above on your After Visit Summary has been reviewed and is understood.  Full responsibility of the confidentiality of this discharge information lies with you and/or your care-partner. 

## 2015-11-14 ENCOUNTER — Other Ambulatory Visit: Payer: Self-pay

## 2015-11-14 ENCOUNTER — Telehealth: Payer: Self-pay | Admitting: *Deleted

## 2015-11-14 DIAGNOSIS — K449 Diaphragmatic hernia without obstruction or gangrene: Secondary | ICD-10-CM

## 2015-11-14 NOTE — Progress Notes (Unsigned)
Per procedure report on 11/13/15 patient was scheduled for a UGI series at [facility] on 11/19/15 10:30.  She will need to be NPO after midnight Left message for patient to call back

## 2015-11-14 NOTE — Telephone Encounter (Signed)
  Follow up Call-  Call back number 11/13/2015 11/29/2014  Post procedure Call Back phone  # [phone]  Permission to leave phone message Yes Yes  Some recent data might be hidden     Patient questions:  Do you have a fever, pain , or abdominal swelling? No. Pain Score  0 *  Have you tolerated food without any problems? Yes.    Have you been able to return to your normal activities? Yes.    Do you have any questions about your discharge instructions: Diet   No. Medications  No. Follow up visit  No.  Do you have questions or concerns about your Care? No.  Actions: * If pain score is 4 or above: No action needed, pain <4.

## 2015-11-19 ENCOUNTER — Ambulatory Visit (HOSPITAL_COMMUNITY): Payer: Medicare Other

## 2015-11-26 ENCOUNTER — Ambulatory Visit (HOSPITAL_COMMUNITY)
Admission: RE | Admit: 2015-11-26 | Discharge: 2015-11-26 | Disposition: A | Discharge status: Home / Self Care | Payer: Medicare Other | Source: Ambulatory Visit | Attending: Internal Medicine | Admitting: Internal Medicine

## 2015-11-26 DIAGNOSIS — K5710 Diverticulosis of small intestine without perforation or abscess without bleeding: Secondary | ICD-10-CM | POA: Insufficient documentation

## 2015-11-26 DIAGNOSIS — K449 Diaphragmatic hernia without obstruction or gangrene: Secondary | ICD-10-CM

## 2015-11-26 DIAGNOSIS — M419 Scoliosis, unspecified: Secondary | ICD-10-CM | POA: Insufficient documentation

## 2015-11-26 NOTE — Progress Notes (Signed)
Xray confirms that she has most/all of stomach in chest and it is twisted some This should be fixed by surgery in my opinion - we discussed this possibility  She needs to see one of the surgeons that does laparoscopic hiatal hernia repair - does she have a request or does she want [place] to refer   Thanks

## 2015-12-05 ENCOUNTER — Other Ambulatory Visit: Payer: Self-pay | Admitting: General Surgery

## 2015-12-05 DIAGNOSIS — K449 Diaphragmatic hernia without obstruction or gangrene: Secondary | ICD-10-CM

## 2015-12-11 ENCOUNTER — Ambulatory Visit
Admission: RE | Admit: 2015-12-11 | Discharge: 2015-12-11 | Disposition: A | Discharge status: Home / Self Care | Payer: Medicare Other | Source: Ambulatory Visit | Attending: General Surgery | Admitting: General Surgery

## 2015-12-11 DIAGNOSIS — K449 Diaphragmatic hernia without obstruction or gangrene: Secondary | ICD-10-CM

## 2015-12-11 MED ORDER — IOPAMIDOL (ISOVUE-300) INJECTION 61%
100.0000 mL | Freq: Once | INTRAVENOUS | Status: AC | PRN
  Administered 2015-12-11: 100 mL via INTRAVENOUS

## 2015-12-24 ENCOUNTER — Ambulatory Visit: Payer: Self-pay | Admitting: General Surgery

## 2015-12-24 NOTE — H&P (Signed)
History of Present Illness  Patient words: hiatal hernia.  The patient is a 70 year old female who presents with a hiatal hernia. Patient is a 70 year old female who is referred by Dr. Silvano Rusk for evaluation of a hiatal hernia. Patient states that she's had multiple year history of dysphagia. Patient was initially diagnosed with a esophageal stricture which have been dilated in the past. Patient states that recently, 2 months ago, she presented ER secondary dysphagia. She had a foreign body obstruction that required EGD removal. 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  Patient does state that recently her Protonix has been doubled. She states that she does have some reflux. It does not appear that she has any water brash. She does not feel that she has any shortness of breath.  Upper GI results reveal: Large hiatal hernia containing stomach and proximal duodenum without ulceration or mass identified. Greater curvature of the stomach remains located inferiorly. Significant delay in emptying of contrast from the displaced stomach and duodenum.   She's had a previous C-section and knee surgery in the past.   Other Problems  Anxiety Disorder Arthritis Back Pain Diverticulosis Gastroesophageal Reflux Disease Hemorrhoids Hypercholesterolemia Kidney Stone Other disease, cancer, significant illness  Past Surgical History  Cesarean Section - 1 Colon Polyp Removal - Colonoscopy Foot Surgery Left. Knee Surgery Right. Oral Surgery Tonsillectomy  Diagnostic Studies History  Mammogram within last year Pap Smear 1-5 years ago  Allergies  No Known Drug Allergies11/07/2015  Medication History  Pantoprazole Sodium (40MG  Tablet DR, Oral) Active. Zocor (10MG  Tablet, Oral) Active. Advil (100MG  Tablet Chewable, Oral) Active. Calcium-Magnesium  (500-250MG  Tablet, Oral) Active. Medications Reconciled  Social History  Caffeine use Carbonated beverages, Tea. No alcohol use No drug use Tobacco use Never smoker.  Family History Arthritis Father, Mother. Cerebrovascular Accident Brother. Colon Polyps Brother, Mother. Diabetes Mellitus Mother. Heart Disease Father, Mother. Hypertension Father.  Pregnancy / Birth History Age at menarche 8 years. Age of menopause 41-55 Gravida 2 Irregular periods Maternal age 81-20 Para 1    Review of Systems  General Not Present- Appetite Loss, Chills, Fatigue, Fever, Night Sweats, Weight Gain and Weight Loss. Skin Not Present- Change in Wart/Mole, Dryness, Hives, Jaundice, New Lesions, Non-Healing Wounds, Rash and Ulcer. HEENT Present- Seasonal Allergies and Wears glasses/contact lenses. Not Present- Earache, Hearing Loss, Hoarseness, Nose Bleed, Oral Ulcers, Ringing in the Ears, Sinus Pain, Sore Throat, Visual Disturbances and Yellow Eyes. Respiratory Not Present- Bloody sputum, Chronic Cough, Difficulty Breathing, Snoring and Wheezing. Breast Not Present- Breast Mass, Breast Pain, Nipple Discharge and Skin Changes. Cardiovascular Not Present- Chest Pain, Difficulty Breathing Lying Down, Leg Cramps, Palpitations, Rapid Heart Rate, Shortness of Breath and Swelling of Extremities. Gastrointestinal Present- Bloating, Excessive gas, Hemorrhoids and Indigestion. Not Present- Abdominal Pain, Bloody Stool, Change in Bowel Habits, Chronic diarrhea, Constipation, Difficulty Swallowing, Gets full quickly at meals, Nausea, Rectal Pain and Vomiting. Female Genitourinary Not Present- Frequency, Nocturia, Painful Urination, Pelvic Pain and Urgency. Musculoskeletal Present- Back Pain and Joint Pain. Not Present- Joint Stiffness, Muscle Pain, Muscle Weakness and Swelling of Extremities. Neurological Not Present- Decreased Memory, Fainting, Headaches, Numbness, Seizures, Tingling, Tremor,  Trouble walking and Weakness. Psychiatric Present- Anxiety. Not Present- Bipolar, Change in Sleep Pattern, Depression, Fearful and Frequent crying. Endocrine Present- Cold Intolerance. Not Present- Excessive Hunger, Hair Changes, Heat Intolerance, Hot flashes and New Diabetes. Hematology Not Present- Blood Thinners, Easy Bruising, Excessive bleeding, Gland  problems, HIV and Persistent Infections.  Vitals  12/04/2015 1:21 PM Weight: 119.6 lb Height: 63in Body Surface Area: 1.55 m Body Mass Index: 21.19 kg/m  Temp.: 98.65F(Oral)  Pulse: 92 (Regular)  BP: 124/70 (Sitting, Left Arm, Standard)       Physical Exam ( Mental Status-Alert. General Appearance-Consistent with stated age. Hydration-Well hydrated. Voice-Normal.  Head and Neck Head-normocephalic, atraumatic with no lesions or palpable masses. Trachea-midline. Thyroid Gland Characteristics - normal size and consistency.  Eye Eyeball - Bilateral-Extraocular movements intact. Sclera/Conjunctiva - Bilateral-No scleral icterus.  Chest and Lung Exam Chest and lung exam reveals -quiet, even and easy respiratory effort with no use of accessory muscles and on auscultation, normal breath sounds, no adventitious sounds and normal vocal resonance. Inspection Chest Wall - Normal. Back - normal.  Breast Breast - Left-Symmetric, Non Tender, No Biopsy scars, no Dimpling, No Inflammation, No Lumpectomy scars, No Mastectomy scars, No Peau d' Orange. Breast - Right-Symmetric, Non Tender, No Biopsy scars, no Dimpling, No Inflammation, No Lumpectomy scars, No Mastectomy scars, No Peau d' Orange. Breast Lump-No Palpable Breast Mass.  Cardiovascular Cardiovascular examination reveals -normal heart sounds, regular rate and rhythm with no murmurs and normal pedal pulses bilaterally.  Abdomen Inspection Inspection of the abdomen reveals - No Hernias. Skin - Scar - no surgical  scars. Palpation/Percussion Palpation and Percussion of the abdomen reveal - Soft, Non Tender, No Rebound tenderness, No Rigidity (guarding) and No hepatosplenomegaly. Auscultation Auscultation of the abdomen reveals - Bowel sounds normal.  Neurologic Neurologic evaluation reveals -alert and oriented x 3 with no impairment of recent or remote memory. Mental Status-Normal.  Musculoskeletal Normal Exam - Left-Upper Extremity Strength Normal and Lower Extremity Strength Normal. Normal Exam - Right-Upper Extremity Strength Normal and Lower Extremity Strength Normal.  Lymphatic Head & Neck  General Head & Neck Lymphatics: Bilateral - Description - Normal. Axillary  General Axillary Region: Bilateral - Description - Normal. Tenderness - Non Tender. Femoral & Inguinal  Generalized Femoral & Inguinal Lymphatics: Bilateral - Description - Normal. Tenderness - Non Tender.    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  1. Had a long discussion with the patient regards to details of hiatal hernia repair and Nissen fundoplication to resolve her hiatal hernia. I discussed with her she would benefit from a robotic hiatal hernia repair and Nissen fundoplication. A pamphlet was given to her. 2. The patient at this time would like to consider timing and speak with her family prior to scheduling surgery. 3. I think could be beneficial for him anatomy standpoint to obtain a CT scan of her abdomen and pelvis to fully evaluate the with the hernia. 4. I did discuss with her the risks and benefits of the procedure to include but not limited to: Infection, bleeding, damage to structures, possible pneumothorax, possible recurrence. Patient voiced understanding. 5. The patient will call us back when she is ready to schedule surgery.

## 2016-01-28 HISTORY — PX: NISSEN FUNDOPLICATION: SHX2091

## 2016-01-31 ENCOUNTER — Encounter (HOSPITAL_COMMUNITY): Payer: Self-pay

## 2016-01-31 ENCOUNTER — Encounter (HOSPITAL_COMMUNITY)
Admission: RE | Admit: 2016-01-31 | Discharge: 2016-01-31 | Disposition: A | Discharge status: Home / Self Care | Payer: Medicare Other | Source: Ambulatory Visit | Attending: General Surgery | Admitting: General Surgery

## 2016-01-31 DIAGNOSIS — K449 Diaphragmatic hernia without obstruction or gangrene: Secondary | ICD-10-CM | POA: Insufficient documentation

## 2016-01-31 DIAGNOSIS — Z01818 Encounter for other preprocedural examination: Secondary | ICD-10-CM | POA: Insufficient documentation

## 2016-01-31 HISTORY — DX: Scoliosis, unspecified: M41.9

## 2016-01-31 HISTORY — DX: Dry eye syndrome of bilateral lacrimal glands: H04.123

## 2016-01-31 LAB — CBC
HCT: 41.5 % (ref 36.0–46.0)
Hemoglobin: 13.6 g/dL (ref 12.0–15.0)
MCH: 29 pg (ref 26.0–34.0)
MCHC: 32.8 g/dL (ref 30.0–36.0)
MCV: 88.5 fL (ref 78.0–100.0)
PLATELETS: 221 10*3/uL (ref 150–400)
RBC: 4.69 MIL/uL (ref 3.87–5.11)
RDW: 13.9 % (ref 11.5–15.5)
WBC: 5.5 10*3/uL (ref 4.0–10.5)

## 2016-01-31 NOTE — Patient Instructions (Addendum)
Jacqueline Ortiz  01/31/2016   Your procedure is scheduled on: 02-06-16  Report to [facility]  Entrance take [facility]  elevators to 3rd floor to  [place] at   1000 AM.  Call this number if you have problems the morning of surgery [phone]   Remember: ONLY 1 PERSON MAY GO WITH YOU TO SHORT STAY TO GET  READY MORNING OF Jacqueline Ortiz.  Do not eat food or drink liquids :After Midnight.     Take these medicines the morning of surgery with A SIP OF WATER: Pantoprazole- if desires. DO NOT TAKE ANY DIABETIC MEDICATIONS DAY OF YOUR SURGERY                               You may not have any metal on your body including hair pins and              piercings  Do not wear jewelry, make-up, lotions, powders or perfumes, deodorant             Do not wear nail polish.  Do not shave  48 hours prior to surgery.              Men may shave face and neck.   Do not bring valuables to the hospital. [name].  Contacts, dentures or bridgework may not be worn into surgery.  Leave suitcase in the car. After surgery it may be brought to your room.     Patients discharged the day of surgery will not be allowed to drive home.  Name and phone number of your driver:Jacqueline Ortiz-spouse [phone] cell  Special Instructions: N/A              Please read over the following fact sheets you were given: _____________________________________________________________________             [facility] - Preparing for Surgery Before surgery, you can play an important role.  Because skin is not sterile, your skin needs to be as free of germs as possible.  You can reduce the number of germs on your skin by washing with CHG (chlorahexidine gluconate) soap before surgery.  CHG is an antiseptic cleaner which kills germs and bonds with the skin to continue killing germs even after washing. Please DO NOT use if you have an allergy to CHG or  antibacterial soaps.  If your skin becomes reddened/irritated stop using the CHG and inform your nurse when you arrive at Short Stay. Do not shave (including legs and underarms) for at least 48 hours prior to the first CHG shower.  You may shave your face/neck. Please follow these instructions carefully:  1.  Shower with CHG Soap the night before surgery and the  morning of Surgery.  2.  If you choose to wash your hair, wash your hair first as usual with your  normal  shampoo.  3.  After you shampoo, rinse your hair and body thoroughly to remove the  shampoo.                           4.  Use CHG as you would any other liquid soap.  You can apply chg directly  to the skin and wash                       Gently with a scrungie or clean washcloth.  5.  Apply the CHG Soap to your body ONLY FROM THE NECK DOWN.   Do not use on face/ open                           Wound or open sores. Avoid contact with eyes, ears mouth and genitals (private parts).                       Wash face,  Genitals (private parts) with your normal soap.             6.  Wash thoroughly, paying special attention to the area where your surgery  will be performed.  7.  Thoroughly rinse your body with warm water from the neck down.  8.  DO NOT shower/wash with your normal soap after using and rinsing off  the CHG Soap.                9.  Pat yourself dry with a clean towel.            10.  Wear clean pajamas.            11.  Place clean sheets on your bed the night of your first shower and do not  sleep with pets. Day of Surgery : Do not apply any lotions/deodorants the morning of surgery.  Please wear clean clothes to the hospital/surgery center.  FAILURE TO FOLLOW THESE INSTRUCTIONS MAY RESULT IN THE CANCELLATION OF YOUR SURGERY PATIENT SIGNATURE_________________________________  NURSE SIGNATURE__________________________________  ________________________________________________________________________

## 2016-01-31 NOTE — Pre-Procedure Instructions (Signed)
EKG/ CXR 9'17 Epic.

## 2016-02-01 ENCOUNTER — Encounter (HOSPITAL_COMMUNITY): Payer: Self-pay

## 2016-02-04 ENCOUNTER — Encounter (HOSPITAL_COMMUNITY): Admission: RE | Admit: 2016-02-04 | Payer: Medicare Other | Source: Ambulatory Visit

## 2016-02-06 ENCOUNTER — Encounter (HOSPITAL_COMMUNITY)
Admission: AD | Disposition: A | Discharge status: Home / Self Care | Payer: Self-pay | Source: Ambulatory Visit | Attending: General Surgery

## 2016-02-06 ENCOUNTER — Ambulatory Visit (HOSPITAL_COMMUNITY): Payer: Medicare Other | Admitting: Registered Nurse

## 2016-02-06 ENCOUNTER — Inpatient Hospital Stay (HOSPITAL_COMMUNITY)
Admission: AD | Admit: 2016-02-06 | Discharge: 2016-02-08 | DRG: 328 | Disposition: A | Discharge status: Home / Self Care | Payer: Medicare Other | Source: Ambulatory Visit | Attending: General Surgery | Admitting: General Surgery

## 2016-02-06 ENCOUNTER — Encounter (HOSPITAL_COMMUNITY): Payer: Self-pay

## 2016-02-06 DIAGNOSIS — Z8249 Family history of ischemic heart disease and other diseases of the circulatory system: Secondary | ICD-10-CM | POA: Diagnosis not present

## 2016-02-06 DIAGNOSIS — Z8261 Family history of arthritis: Secondary | ICD-10-CM | POA: Diagnosis not present

## 2016-02-06 DIAGNOSIS — K449 Diaphragmatic hernia without obstruction or gangrene: Principal | ICD-10-CM | POA: Diagnosis present

## 2016-02-06 DIAGNOSIS — Z9889 Other specified postprocedural states: Secondary | ICD-10-CM

## 2016-02-06 DIAGNOSIS — Z86010 Personal history of colonic polyps: Secondary | ICD-10-CM

## 2016-02-06 DIAGNOSIS — Z931 Gastrostomy status: Secondary | ICD-10-CM

## 2016-02-06 DIAGNOSIS — K649 Unspecified hemorrhoids: Secondary | ICD-10-CM | POA: Diagnosis present

## 2016-02-06 DIAGNOSIS — K219 Gastro-esophageal reflux disease without esophagitis: Secondary | ICD-10-CM | POA: Diagnosis present

## 2016-02-06 DIAGNOSIS — Z833 Family history of diabetes mellitus: Secondary | ICD-10-CM

## 2016-02-06 DIAGNOSIS — Z823 Family history of stroke: Secondary | ICD-10-CM | POA: Diagnosis not present

## 2016-02-06 DIAGNOSIS — E7800 Pure hypercholesterolemia, unspecified: Secondary | ICD-10-CM | POA: Diagnosis present

## 2016-02-06 DIAGNOSIS — K5790 Diverticulosis of intestine, part unspecified, without perforation or abscess without bleeding: Secondary | ICD-10-CM | POA: Diagnosis present

## 2016-02-06 DIAGNOSIS — M1990 Unspecified osteoarthritis, unspecified site: Secondary | ICD-10-CM | POA: Diagnosis present

## 2016-02-06 DIAGNOSIS — Z8371 Family history of colonic polyps: Secondary | ICD-10-CM | POA: Diagnosis not present

## 2016-02-06 DIAGNOSIS — F419 Anxiety disorder, unspecified: Secondary | ICD-10-CM | POA: Diagnosis present

## 2016-02-06 SURGERY — FUNDOPLICATION, NISSEN, ROBOT-ASSISTED, LAPAROSCOPIC
Anesthesia: General | Site: Abdomen

## 2016-02-06 MED ORDER — ONDANSETRON HCL 4 MG/2ML IJ SOLN
INTRAMUSCULAR | Status: AC
  Filled 2016-02-06: qty 2

## 2016-02-06 MED ORDER — PROMETHAZINE HCL 25 MG/ML IJ SOLN: 6.2500 mg | INTRAMUSCULAR | Status: DC | PRN

## 2016-02-06 MED ORDER — LACTATED RINGERS IR SOLN
Status: DC | PRN
  Administered 2016-02-06: 1000 mL

## 2016-02-06 MED ORDER — ROCURONIUM BROMIDE 50 MG/5ML IV SOSY
PREFILLED_SYRINGE | INTRAVENOUS | Status: AC
  Filled 2016-02-06: qty 5

## 2016-02-06 MED ORDER — BUPIVACAINE HCL (PF) 0.25 % IJ SOLN
INTRAMUSCULAR | Status: AC
  Filled 2016-02-06: qty 30

## 2016-02-06 MED ORDER — DEXAMETHASONE SODIUM PHOSPHATE 10 MG/ML IJ SOLN
INTRAMUSCULAR | Status: AC
  Filled 2016-02-06: qty 1

## 2016-02-06 MED ORDER — ROCURONIUM BROMIDE 10 MG/ML (PF) SYRINGE
PREFILLED_SYRINGE | INTRAVENOUS | Status: DC | PRN
  Administered 2016-02-06: 10 mg via INTRAVENOUS
  Administered 2016-02-06: 50 mg via INTRAVENOUS

## 2016-02-06 MED ORDER — MIDAZOLAM HCL 5 MG/5ML IJ SOLN
INTRAMUSCULAR | Status: DC | PRN
  Administered 2016-02-06 (×2): 1 mg via INTRAVENOUS

## 2016-02-06 MED ORDER — DEXAMETHASONE SODIUM PHOSPHATE 10 MG/ML IJ SOLN
INTRAMUSCULAR | Status: DC | PRN
  Administered 2016-02-06: 10 mg via INTRAVENOUS

## 2016-02-06 MED ORDER — HYDROMORPHONE HCL 2 MG/ML IJ SOLN
1.0000 mg | INTRAMUSCULAR | Status: DC | PRN
  Filled 2016-02-06: qty 1

## 2016-02-06 MED ORDER — ENOXAPARIN SODIUM 40 MG/0.4ML ~~LOC~~ SOLN
40.0000 mg | SUBCUTANEOUS | Status: DC
Start: 2016-02-06 — End: 2016-02-08
  Administered 2016-02-06 – 2016-02-07 (×2): 40 mg via SUBCUTANEOUS
  Filled 2016-02-06 (×2): qty 0.4

## 2016-02-06 MED ORDER — LIDOCAINE 2% (20 MG/ML) 5 ML SYRINGE
INTRAMUSCULAR | Status: AC
  Filled 2016-02-06: qty 5

## 2016-02-06 MED ORDER — ACETAMINOPHEN 10 MG/ML IV SOLN
INTRAVENOUS | Status: DC | PRN
  Administered 2016-02-06: 1000 mg via INTRAVENOUS

## 2016-02-06 MED ORDER — HYDROMORPHONE HCL 1 MG/ML IJ SOLN
INTRAMUSCULAR | Status: AC
  Filled 2016-02-06: qty 1

## 2016-02-06 MED ORDER — CEFAZOLIN SODIUM-DEXTROSE 2-4 GM/100ML-% IV SOLN
INTRAVENOUS | Status: AC
  Filled 2016-02-06: qty 100

## 2016-02-06 MED ORDER — PHENYLEPHRINE 40 MCG/ML (10ML) SYRINGE FOR IV PUSH (FOR BLOOD PRESSURE SUPPORT)
PREFILLED_SYRINGE | INTRAVENOUS | Status: AC
  Filled 2016-02-06: qty 10

## 2016-02-06 MED ORDER — CHLORHEXIDINE GLUCONATE CLOTH 2 % EX PADS: 6.0000 | MEDICATED_PAD | Freq: Once | CUTANEOUS | Status: DC

## 2016-02-06 MED ORDER — ACETAMINOPHEN 10 MG/ML IV SOLN
INTRAVENOUS | Status: AC
  Filled 2016-02-06: qty 100

## 2016-02-06 MED ORDER — MIDAZOLAM HCL 2 MG/2ML IJ SOLN
INTRAMUSCULAR | Status: AC
Start: 2016-02-06 — End: 2016-02-06
  Filled 2016-02-06: qty 2

## 2016-02-06 MED ORDER — SUGAMMADEX SODIUM 200 MG/2ML IV SOLN
INTRAVENOUS | Status: AC
  Filled 2016-02-06: qty 2

## 2016-02-06 MED ORDER — LIDOCAINE HCL (CARDIAC) 20 MG/ML IV SOLN
INTRAVENOUS | Status: DC | PRN
  Administered 2016-02-06: 100 mg via INTRAVENOUS

## 2016-02-06 MED ORDER — MEPERIDINE HCL 50 MG/ML IJ SOLN: 6.2500 mg | INTRAMUSCULAR | Status: DC | PRN

## 2016-02-06 MED ORDER — FENTANYL CITRATE (PF) 100 MCG/2ML IJ SOLN
INTRAMUSCULAR | Status: DC | PRN
  Administered 2016-02-06 (×4): 50 ug via INTRAVENOUS

## 2016-02-06 MED ORDER — BUPIVACAINE HCL (PF) 0.25 % IJ SOLN
INTRAMUSCULAR | Status: DC | PRN
  Administered 2016-02-06: 7 mL

## 2016-02-06 MED ORDER — FENTANYL CITRATE (PF) 250 MCG/5ML IJ SOLN
INTRAMUSCULAR | Status: AC
  Filled 2016-02-06: qty 5

## 2016-02-06 MED ORDER — CEFAZOLIN SODIUM-DEXTROSE 2-4 GM/100ML-% IV SOLN
2.0000 g | INTRAVENOUS | Status: AC
  Administered 2016-02-06: 2 g via INTRAVENOUS
  Filled 2016-02-06: qty 100

## 2016-02-06 MED ORDER — LIP MEDEX EX OINT
TOPICAL_OINTMENT | CUTANEOUS | Status: AC
  Filled 2016-02-06: qty 7

## 2016-02-06 MED ORDER — 0.9 % SODIUM CHLORIDE (POUR BTL) OPTIME
TOPICAL | Status: DC | PRN
  Administered 2016-02-06: 1000 mL

## 2016-02-06 MED ORDER — ONDANSETRON 4 MG PO TBDP: 4.0000 mg | ORAL_TABLET | Freq: Four times a day (QID) | ORAL | Status: DC | PRN

## 2016-02-06 MED ORDER — SUGAMMADEX SODIUM 200 MG/2ML IV SOLN
INTRAVENOUS | Status: DC | PRN
  Administered 2016-02-06: 150 mg via INTRAVENOUS

## 2016-02-06 MED ORDER — HYDROMORPHONE HCL 1 MG/ML IJ SOLN
0.2500 mg | INTRAMUSCULAR | Status: DC | PRN
  Administered 2016-02-06 (×3): 0.5 mg via INTRAVENOUS

## 2016-02-06 MED ORDER — DEXTROSE-NACL 5-0.9 % IV SOLN
INTRAVENOUS | Status: DC
  Administered 2016-02-06 – 2016-02-07 (×2): via INTRAVENOUS

## 2016-02-06 MED ORDER — PROPOFOL 10 MG/ML IV BOLUS
INTRAVENOUS | Status: DC | PRN
  Administered 2016-02-06: 120 mg via INTRAVENOUS

## 2016-02-06 MED ORDER — PROPOFOL 10 MG/ML IV BOLUS
INTRAVENOUS | Status: AC
  Filled 2016-02-06: qty 20

## 2016-02-06 MED ORDER — ONDANSETRON HCL 4 MG/2ML IJ SOLN: 4.0000 mg | Freq: Four times a day (QID) | INTRAMUSCULAR | Status: DC | PRN

## 2016-02-06 MED ORDER — ONDANSETRON HCL 4 MG/2ML IJ SOLN
INTRAMUSCULAR | Status: DC | PRN
  Administered 2016-02-06: 4 mg via INTRAVENOUS

## 2016-02-06 MED ORDER — LACTATED RINGERS IV SOLN
INTRAVENOUS | Status: DC
  Administered 2016-02-06: 1000 mL via INTRAVENOUS

## 2016-02-06 MED ORDER — PHENYLEPHRINE 40 MCG/ML (10ML) SYRINGE FOR IV PUSH (FOR BLOOD PRESSURE SUPPORT)
PREFILLED_SYRINGE | INTRAVENOUS | Status: DC | PRN
  Administered 2016-02-06: 40 ug via INTRAVENOUS

## 2016-02-06 SURGICAL SUPPLY — 60 items
APPLIER CLIP 5 13 M/L LIGAMAX5 (MISCELLANEOUS)
APPLIER CLIP ROT 10 11.4 M/L (STAPLE)
BLADE SURG SZ11 CARB STEEL (BLADE) ×3 IMPLANT
CHLORAPREP W/TINT 26ML (MISCELLANEOUS) ×3 IMPLANT
CLIP APPLIE 5 13 M/L LIGAMAX5 (MISCELLANEOUS) IMPLANT
CLIP APPLIE ROT 10 11.4 M/L (STAPLE) IMPLANT
CLIP LIGATING HEM O LOK PURPLE (MISCELLANEOUS) IMPLANT
CLIP LIGATING HEMO O LOK GREEN (MISCELLANEOUS) IMPLANT
COVER TIP SHEARS 8 DVNC (MISCELLANEOUS) IMPLANT
COVER TIP SHEARS 8MM DA VINCI (MISCELLANEOUS)
DECANTER SPIKE VIAL GLASS SM (MISCELLANEOUS) ×3 IMPLANT
DERMABOND ADVANCED (GAUZE/BANDAGES/DRESSINGS) ×2
DERMABOND ADVANCED .7 DNX12 (GAUZE/BANDAGES/DRESSINGS) ×1 IMPLANT
DEVICE TROCAR PUNCTURE CLOSURE (ENDOMECHANICALS) IMPLANT
DRAIN PENROSE 18X1/2 LTX STRL (DRAIN) ×3 IMPLANT
DRAPE ARM DVNC X/XI (DISPOSABLE) ×4 IMPLANT
DRAPE COLUMN DVNC XI (DISPOSABLE) ×1 IMPLANT
DRAPE DA VINCI XI ARM (DISPOSABLE) ×8
DRAPE DA VINCI XI COLUMN (DISPOSABLE) ×2
DRAPE SHEET LG 3/4 BI-LAMINATE (DRAPES) IMPLANT
ELECT REM PT RETURN 9FT ADLT (ELECTROSURGICAL) ×3
ELECTRODE REM PT RTRN 9FT ADLT (ELECTROSURGICAL) ×1 IMPLANT
ENDOLOOP SUT PDS II  0 18 (SUTURE)
ENDOLOOP SUT PDS II 0 18 (SUTURE) IMPLANT
GAUZE SPONGE 4X4 16PLY XRAY LF (GAUZE/BANDAGES/DRESSINGS) ×3 IMPLANT
GLOVE BIO SURGEON STRL SZ7.5 (GLOVE) ×6 IMPLANT
GOWN STRL REUS W/TWL XL LVL3 (GOWN DISPOSABLE) ×12 IMPLANT
IRRIG SUCT STRYKERFLOW 2 WTIP (MISCELLANEOUS)
IRRIGATION SUCT STRKRFLW 2 WTP (MISCELLANEOUS) IMPLANT
KIT BASIN OR (CUSTOM PROCEDURE TRAY) ×3 IMPLANT
MARKER SKIN DUAL TIP RULER LAB (MISCELLANEOUS) ×3 IMPLANT
MESH HERNIA 7X10 (Mesh General) ×3 IMPLANT
NEEDLE HYPO 22GX1.5 SAFETY (NEEDLE) ×3 IMPLANT
NEEDLE INSUFFLATION 14GA 120MM (NEEDLE) ×3 IMPLANT
PACK CARDIOVASCULAR III (CUSTOM PROCEDURE TRAY) ×3 IMPLANT
PAD POSITIONING PINK XL (MISCELLANEOUS) ×3 IMPLANT
SCISSORS LAP 5X35 DISP (ENDOMECHANICALS) ×3 IMPLANT
SEAL CANN UNIV 5-8 DVNC XI (MISCELLANEOUS) ×4 IMPLANT
SEAL XI 5MM-8MM UNIVERSAL (MISCELLANEOUS) ×8
SEALER VESSEL DA VINCI XI (MISCELLANEOUS) ×2
SEALER VESSEL EXT DVNC XI (MISCELLANEOUS) ×1 IMPLANT
SET BI-LUMEN FLTR TB AIRSEAL (TUBING) ×3 IMPLANT
SLEEVE XCEL OPT CAN 5 100 (ENDOMECHANICALS) IMPLANT
SOLUTION ANTI FOG 6CC (MISCELLANEOUS) ×3 IMPLANT
SOLUTION ELECTROLUBE (MISCELLANEOUS) ×3 IMPLANT
STAPLER VISISTAT 35W (STAPLE) IMPLANT
SUT ETHIBOND 0 36 GRN (SUTURE) ×6 IMPLANT
SUT MNCRL AB 4-0 PS2 18 (SUTURE) ×3 IMPLANT
SUT SILK 0 SH 30 (SUTURE) ×3 IMPLANT
SUT SILK 2 0 SH (SUTURE) ×6 IMPLANT
SYR 10ML LL (SYRINGE) ×3 IMPLANT
SYR 20CC LL (SYRINGE) ×3 IMPLANT
TOWEL OR 17X26 10 PK STRL BLUE (TOWEL DISPOSABLE) ×3 IMPLANT
TOWEL OR NON WOVEN STRL DISP B (DISPOSABLE) ×3 IMPLANT
TRAY FOLEY CATH SILVER 14FR (SET/KITS/TRAYS/PACK) ×3 IMPLANT
TRAY FOLEY W/METER SILVER 16FR (SET/KITS/TRAYS/PACK) IMPLANT
TROCAR ADV FIXATION 5X100MM (TROCAR) ×3 IMPLANT
TUBING CONNECTING 10 (TUBING) ×2 IMPLANT
TUBING CONNECTING 10' (TUBING) ×1
TUBING ENDO SMARTCAP PENTAX (MISCELLANEOUS) ×3 IMPLANT

## 2016-02-06 NOTE — Discharge Instructions (Signed)
EATING AFTER YOUR ESOPHAGEAL SURGERY (Stomach Fundoplication, Hiatal Hernia repair, Achalasia surgery, etc)  ######################################################################  EAT Start with a pureed / full liquid diet (see below) Gradually transition to a high fiber diet with a fiber supplement over the next month after discharge.    WALK Walk an hour a day.  Control your pain to do that.    CONTROL PAIN Control pain so that you can walk, sleep, tolerate sneezing/coughing, go up/down stairs.  HAVE A BOWEL MOVEMENT DAILY Keep your bowels regular to avoid problems.  OK to try a laxative to override constipation.  OK to use an antidairrheal to slow down diarrhea.  Call if not better after 2 tries  CALL IF YOU HAVE PROBLEMS/CONCERNS Call if you are still struggling despite following these instructions. Call if you have concerns not answered by these instructions  ######################################################################   After your esophageal surgery, expect some sticking with swallowing over the next 1-2 months.    If food sticks when you eat, it is called "dysphagia".  This is due to swelling around your esophagus at the wrap & hiatal diaphragm repair.  It will gradually ease off over the next few months.  To help you through this temporary phase, we start you out on a pureed (blenderized) diet.  Your first meal in the hospital was thin liquids.  You should have been given a pureed diet by the time you left the hospital.  We ask patients to stay on a pureed diet for the first 2-3 weeks to avoid anything getting "stuck" near your recent surgery.  Don't be alarmed if your ability to swallow doesn't progress according to this plan.  Everyone is different and some diets can advance more or less quickly.     Some BASIC RULES to follow are:  Maintain an upright position whenever eating or drinking.  Take small bites - just a teaspoon size bite at a time.  Eat slowly.   It may also help to eat only one food at a time.  Consider nibbling through smaller, more frequent meals & avoid the urge to eat BIG meals  Do not push through feelings of fullness, nausea, or bloatedness  Do not mix solid foods and liquids in the same mouthful  Try not to "wash foods down" with large gulps of liquids.  Avoid carbonated (bubbly/fizzy) drinks.    Avoid foods that make you feel gassy or bloated.  Start with bland foods first.  Wait on trying greasy, fried, or spicy meals until you are tolerating more bland solids well.  Understand that it will be hard to burp and belch at first.  This gradually improves with time.  Expect to be more gassy/flatulent/bloated initially.  Walking will help your body manage it better.  Consider using medications for bloating that contain simethicone such as  Maalox or Gas-X   Eat in a relaxed atmosphere & minimize distractions.  Avoid talking while eating.    Do not use straws.  Following each meal, sit in an upright position (90 degree angle) for 60 to 90 minutes.  Going for a short walk can help as well  If food does stick, don't panic.  Try to relax and let the food pass on its own.  Sipping WARM LIQUID such as strong hot black tea can also help slide it down.   Be gradual in changes & use common sense:  -If you easily tolerating a certain "level" of foods, advance to the next level gradually -If you are  having trouble swallowing a particular food, then avoid it.   -If food is sticking when you advance your diet, go back to thinner previous diet (the lower LEVEL) for 1-2 days.  LEVEL 1 = PUREED DIET  Do for the first 2 WEEKS AFTER SURGERY  -Foods in this group are pureed or blenderized to a smooth, mashed potato-like consistency.  -If necessary, the pureed foods can keep their shape with the addition of a thickening agent.   -Meat should be pureed to a smooth, pasty consistency.  Hot broth or gravy may be added to the pureed  meat, approximately 1 oz. of liquid per 3 oz. serving of meat. -CAUTION:  If any foods do not puree into a smooth consistency, swallowing will be more difficult.  (For example, nuts or seeds sometimes do not blend well.)  Hot Foods Cold Foods  Pureed scrambled eggs and cheese Pureed cottage cheese  Baby cereals Thickened juices and nectars  Thinned cooked cereals (no lumps) Thickened milk or eggnog  Pureed [place] toast or pancakes Ensure  Mashed potatoes Ice cream  Pureed parsley, au gratin, scalloped potatoes, candied sweet potatoes Fruit or [place] ice, sherbet  Pureed buttered or alfredo noodles Plain yogurt  Pureed vegetables (no corn or peas) Instant breakfast  Pureed soups and creamed soups Smooth pudding, mousse, custard  Pureed scalloped apples Whipped gelatin  Gravies Sugar, syrup, honey, jelly  Sauces, cheese, tomato, barbecue, white, creamed Cream  Any baby food Creamer  Alcohol in moderation (not beer or champagne) Margarine  Coffee or tea Mayonnaise   Ketchup, mustard   Apple sauce   SAMPLE MENU:  PUREED DIET Breakfast Lunch Dinner   Orange juice, 1/2 cup  Cream of wheat, 1/2 cup  Pineapple juice, 1/2 cup  Pureed [place], barley soup, 3/4 cup  Pureed Hawaiian chicken, 3 oz   Scrambled eggs, mashed or blended with cheese, 1/2 cup  Tea or coffee, 1 cup   Whole milk, 1 cup   Non-dairy creamer, 2 Tbsp.  Mashed potatoes, 1/2 cup  Pureed cooled broccoli, 1/2 cup  Apple sauce, 1/2 cup  Coffee or tea  Mashed potatoes, 1/2 cup  Pureed spinach, 1/2 cup  Frozen yogurt, 1/2 cup  Tea or coffee      LEVEL 2 = SOFT DIET  After your first 2 weeks, you can advance to a soft diet.   Keep on this diet until everything goes down easily.  Hot Foods Cold Foods  White fish Cottage cheese  Stuffed fish Junior baby fruit  Baby food meals Semi thickened juices  Minced soft cooked, scrambled, poached eggs nectars  Souffle & omelets Ripe mashed bananas  Cooked  cereals Canned fruit, pineapple sauce, milk  potatoes Milkshake  Buttered or Alfredo noodles Custard  Cooked cooled vegetable Puddings, including tapioca  Sherbet Yogurt  Vegetable soup or alphabet soup Fruit ice, [place] ice  Gravies Whipped gelatin  Sugar, syrup, honey, jelly Junior baby desserts  Sauces:  Cheese, creamed, barbecue, tomato, white Cream  Coffee or tea Margarine   SAMPLE MENU:  LEVEL 2 Breakfast Lunch Dinner   Orange juice, 1/2 cup  Oatmeal, 1/2 cup  Scrambled eggs with cheese, 1/2 cup  Decaffeinated tea, 1 cup  Whole milk, 1 cup  Non-dairy creamer, 2 Tbsp  Pineapple juice, 1/2 cup  Minced beef, 3 oz  Gravy, 2 Tbsp  Mashed potatoes, 1/2 cup  Minced fresh broccoli, 1/2 cup  Applesauce, 1/2 cup  Coffee, 1 cup  [place], barley soup, 3/4 cup  Minced Hawaiian chicken, 3 oz  Mashed potatoes, 1/2 cup  Cooked spinach, 1/2 cup  Frozen yogurt, 1/2 cup  Non-dairy creamer, 2 Tbsp      LEVEL 3 = CHOPPED DIET  -After all the foods in level 2 (soft diet) are passing through well you should advance up to more chopped foods.  -It is still important to cut these foods into small pieces and eat slowly.  Hot Foods Cold Foods  Poultry Cottage cheese  Chopped Swedish meatballs Yogurt  Meat salads (ground or flaked meat) Milk  Flaked fish (tuna) Milkshakes  Poached or scrambled eggs Soft, cold, dry cereal  Souffles and omelets Fruit juices or nectars  Cooked cereals Chopped canned fruit  Chopped [place] toast or pancakes Canned fruit cocktail  Noodles or pasta (no rice) Pudding, mousse, custard  Cooked vegetables (no frozen peas, corn, or mixed vegetables) Green salad  Canned small sweet peas Ice cream  Creamed soup or vegetable soup Fruit ice, [place] ice  Pureed vegetable soup or alphabet soup Non-dairy creamer  Ground scalloped apples Margarine  Gravies Mayonnaise  Sauces:  Cheese, creamed, barbecue, tomato, white Ketchup  Coffee or tea Mustard    SAMPLE MENU:  LEVEL 3 Breakfast Lunch Dinner   Orange juice, 1/2 cup  Oatmeal, 1/2 cup  Scrambled eggs with cheese, 1/2 cup  Decaffeinated tea, 1 cup  Whole milk, 1 cup  Non-dairy creamer, 2 Tbsp  Ketchup, 1 Tbsp  Margarine, 1 tsp  Salt, 1/4 tsp  Sugar, 2 tsp  Pineapple juice, 1/2 cup  Ground beef, 3 oz  Gravy, 2 Tbsp  Mashed potatoes, 1/2 cup  Cooked spinach, 1/2 cup  Applesauce, 1/2 cup  Decaffeinated coffee  Whole milk  Non-dairy creamer, 2 Tbsp  Margarine, 1 tsp  Salt, 1/4 tsp  Pureed [place], barley soup, 3/4 cup  Barbecue chicken, 3 oz  Mashed potatoes, 1/2 cup  Ground fresh broccoli, 1/2 cup  Frozen yogurt, 1/2 cup  Decaffeinated tea, 1 cup  Non-dairy creamer, 2 Tbsp  Margarine, 1 tsp  Salt, 1/4 tsp  Sugar, 1 tsp    LEVEL 4:  REGULAR FOODS  -Foods in this group are soft, moist, regularly textured foods.   -This level includes meat and breads, which tend to be the hardest things to swallow.   -Eat very slowly, chew well and continue to avoid carbonated drinks. -most people are at this level in 4-6 weeks  Hot Foods Cold Foods  Baked fish or skinned Soft cheeses - cottage cheese  Souffles and omelets Cream cheese  Eggs Yogurt  Stuffed shells Milk  Spaghetti with meat sauce Milkshakes  Cooked cereal Cold dry cereals (no nuts, dried fruit, coconut)  [place] toast or pancakes Crackers  Buttered toast Fruit juices or nectars  Noodles or pasta (no rice) Canned fruit  Potatoes (all types) Ripe bananas  Soft, cooked vegetables (no corn, lima, or baked beans) Peeled, ripe, fresh fruit  Creamed soups or vegetable soup Cakes (no nuts, dried fruit, coconut)  Canned chicken noodle soup Plain doughnuts  Gravies Ice cream  Bacon dressing Pudding, mousse, custard  Sauces:  Cheese, creamed, barbecue, tomato, white Fruit ice, [place] ice, sherbet  Decaffeinated tea or coffee Whipped gelatin  Pork chops Regular gelatin   Canned fruited  gelatin molds   Sugar, syrup, honey, jam, jelly   Cream   Non-dairy   Margarine   Oil   Mayonnaise   Ketchup   Mustard   TROUBLESHOOTING IRREGULAR BOWELS  1) Avoid extremes of bowel  movements (no bad constipation/diarrhea)  °2) Miralax 17gm mixed in 8oz. water or juice-daily. May use BID as needed.  °3) Gas-x,Phazyme, etc. as needed for gas & bloating.  °4) Soft,bland diet. No spicy,greasy,fried foods.  °5) Prilosec over-the-counter as needed  °6) May hold gluten/wheat products from diet to see if symptoms improve.  °7) May try probiotics (Align, Activa, etc) to help calm the bowels down  °7) If symptoms become worse call back immediately. ° ° ° °If you have any questions please call our office at [facility]: [phone]. ° °

## 2016-02-06 NOTE — Anesthesia Procedure Notes (Signed)
Procedure Name: Intubation Date/Time: 02/06/2016 12:37 PM Performed by: Carleene Cooper A Pre-anesthesia Checklist: Patient identified, Timeout performed, Emergency Drugs available, Suction available and Patient being monitored Patient Re-evaluated:Patient Re-evaluated prior to inductionOxygen Delivery Method: Circle system utilized Preoxygenation: Pre-oxygenation with 100% oxygen Intubation Type: IV induction Ventilation: Mask ventilation without difficulty Laryngoscope Size: Mac and 3 Grade View: Grade II Tube type: Oral Tube size: 7.0 mm Number of attempts: 1 Airway Equipment and Method: Stylet Placement Confirmation: ETT inserted through vocal cords under direct vision,  positive ETCO2 and breath sounds checked- equal and bilateral Secured at: 21 cm Tube secured with: Tape Dental Injury: Teeth and Oropharynx as per pre-operative assessment

## 2016-02-06 NOTE — Anesthesia Preprocedure Evaluation (Addendum)
Anesthesia Evaluation  Patient identified by MRN, date of birth, ID band Patient awake    Reviewed: Allergy & Precautions, NPO status , Patient's Chart, lab work & pertinent test results  Airway Mallampati: II  TM Distance: >3 FB Neck ROM: Full    Dental no notable dental hx.    Pulmonary neg pulmonary ROS,    Pulmonary exam normal breath sounds clear to auscultation       Cardiovascular negative cardio ROS Normal cardiovascular exam Rhythm:Regular Rate:Normal     Neuro/Psych PSYCHIATRIC DISORDERS Anxiety Depression negative neurological ROS     GI/Hepatic Neg liver ROS, GERD  Medicated,  Endo/Other  negative endocrine ROS  Renal/GU Renal disease     Musculoskeletal  (+) Arthritis ,   Abdominal   Peds  Hematology negative hematology ROS (+)   Anesthesia Other Findings   Reproductive/Obstetrics negative OB ROS                             Anesthesia Physical Anesthesia Plan  ASA: II  Anesthesia Plan: General   Post-op Pain Management:    Induction: Intravenous  Airway Management Planned: Oral ETT  Additional Equipment:   Intra-op Plan:   Post-operative Plan: Extubation in OR  Informed Consent: I have reviewed the patients History and Physical, chart, labs and discussed the procedure including the risks, benefits and alternatives for the proposed anesthesia with the patient or authorized representative who has indicated his/her understanding and acceptance.   Dental advisory given  Plan Discussed with: CRNA  Anesthesia Plan Comments:         Anesthesia Quick Evaluation

## 2016-02-06 NOTE — Transfer of Care (Signed)
Immediate Anesthesia Transfer of Care Note  Patient: Jacqueline Ortiz  Procedure(s) Performed: Procedure(s): XI ROBOTIC ASSISTED HIATAL HERNIA REPAIR WITH MESH AND NISSEN FUNDOPLICATION (N/A)  Patient Location: PACU  Anesthesia Type:General  Level of Consciousness: awake, alert  and oriented  Airway & Oxygen Therapy: Patient Spontanous Breathing and Patient connected to face mask oxygen  Post-op Assessment: Report given to RN and Post -op Vital signs reviewed and stable  Post vital signs: Reviewed and stable  Last Vitals:  Vitals:   02/06/16 1011  BP: 126/76  Pulse: (!) 109  Resp: 16  Temp: 36.7 C    Last Pain:  Vitals:   02/06/16 1011  TempSrc: Oral      Patients Stated Pain Goal: 3 ([id number])  Complications: No apparent anesthesia complications

## 2016-02-06 NOTE — Op Note (Signed)
02/06/2016  3:00 PM  PATIENT:  Jacqueline Ortiz  71 y.o. female  PRE-OPERATIVE DIAGNOSIS:  HIATAL HERNIA  POST-OPERATIVE DIAGNOSIS:  HIATAL HERNIA  PROCEDURE:  Procedure(s): XI ROBOTIC ASSISTED HIATAL HERNIA REPAIR WITH MESH AND NISSEN FUNDOPLICATION (N/A)  SURGEON:  Surgeon(s) and Role:    * Clovis Riley, MD - Assisting    * Ralene Ok, MD - Primary ANESTHESIA:   local and general  EBL:  Total I/O In: 1000 [I.V.:1000] Out: 175 [Urine:150; Blood:25]  BLOOD ADMINISTERED:none  DRAINS: none   LOCAL MEDICATIONS USED:  BUPIVICAINE   SPECIMEN:  No Specimen  DISPOSITION OF SPECIMEN:  N/A  COUNTS:  YES  TOURNIQUET:  * No tourniquets in log *  DICTATION: .Dragon Dictation  The patient was taken back to the operating room and placed in the supine position with bilateral SCDs in place. The patient was prepped and draped in the usual sterile fashion. After appropriate antibiotics were confirmed a timeout was called and all facts were verified.   A Veress needle technique was used to insufflate the abdomen to 15 mm of mercury the paramedian stab incision. Subsequent to this an 8 mm trocar was introduced as was a 8 millimeter camera.    Subsequent trocars were inserted under direct visualization, there were total of 4 trochars. The assistant trocar was then placed in the right lower quadrant under direct visualization. The Nathanson retractor was then visualized inserted into the abdomen and the incision just to the left of the falciform ligament. This was then placed to retract the liver appropriately. At this time the patient was positioned in reverse Trendelenburg.   At this time the robot patient cart was brought to the bedside and placed in good position and the arms were docked to the trochars appropriately. At this time I proceeded to incised the gastrohepatic ligament.  At this time I proceeded to mobilize the stomach inferiorly and visualize the right crus. The peritoneum  over the right crus was incised and right crus was identified. I proceeded to dissect this inferiorly until the left crus was seen joining the right crus. Once the right crus was adequately dissected we turned our to the left crus which was dissected away. This required traction of the stomach to the right side. Once this was visualized we then proceeded to circumferentially dissect the esophagus away from the surrounding tissue. At this time a Penrose drain was placed around the esophagus to help with retraction. At this time the phrenoesophageal fat pad was dissected away from the esophagus. There was a 3-4cm hiatal hernia seen.  The hernia sac was mobilized from the thoracic cavity. This was brought into the abdominal cavity. I mobilized the esophagus cephalad approximately 5-6 cm, clearing away the surrounding tissue.   At this time we turned our attention to the greater curvature the stomach and the omentum was mobilized using the robotic vessel sealer. This was taken up to the greater curvature to the hiatus. This mobilized the entire greater curvature to allow mobilization and the wrap. I then proceeded to bring the greater curvature the stomach posterior to the esophagus, and a shoeshine technique was used to evaluate the mobilization of the greater curvature.   At this time I proceeded to close the hiatus using 5 interrupted 0 Ethibond stitches in interrupted fashion. This brought together the hiatal closure without undue stricture to the esophagus. There was a minimal tear into the left crus.  A piece of Bard bilaterally mesh was placed over  the hiatal closure and sutured to the crus using 0 Ethibond sutures x 4.  At this time the greater curvature was brought around the esophagus and sutured using 0 Ethibond sutures interrupted fashion approximately 1 cm apart x3. The middle suture was sutured to the esophagus. A left collar stitch was then used to gastropexy the stomach from the wrap to the  diaphragm just lateral to the left crus as.  A second collar stitch was placed from the wrap to the right crus. The wrap lay at approximately 11:00 on its own with undue tension.  The wrap lay loose with no strangulation of the esophagus.  At this time the robot was undocked. The liver trocar was removed. At this time insufflation was evacuated. Skin was reapproximated for Monocryl subcuticular fashion. The skin was then dressed with Dermabond. The patient tolerated the procedure well and was taken to the recovery room in stable condition.  PLAN OF CARE: Admit to inpatient   PATIENT DISPOSITION:  PACU - hemodynamically stable.   Delay start of Pharmacological VTE agent (>24hrs) due to surgical blood loss or risk of bleeding: yes

## 2016-02-06 NOTE — Anesthesia Preprocedure Evaluation (Signed)
Anesthesia Evaluation  Patient identified by MRN, date of birth, ID band Patient awake    Reviewed: Allergy & Precautions, NPO status , Patient's Chart, lab work & pertinent test results  Airway Mallampati: II       Dental no notable dental hx.    Pulmonary neg pulmonary ROS,    Pulmonary exam normal        Cardiovascular negative cardio ROS Normal cardiovascular exam     Neuro/Psych negative neurological ROS     GI/Hepatic Neg liver ROS, GERD  Medicated and Controlled,  Endo/Other    Renal/GU   negative genitourinary   Musculoskeletal   Abdominal Normal abdominal exam  (+)   Peds negative pediatric ROS (+)  Hematology negative hematology ROS (+)   Anesthesia Other Findings   Reproductive/Obstetrics negative OB ROS                             Anesthesia Physical Anesthesia Plan  ASA: II  Anesthesia Plan: General   Post-op Pain Management:    Induction: Intravenous  Airway Management Planned: Oral ETT  Additional Equipment:   Intra-op Plan:   Post-operative Plan: Extubation in OR  Informed Consent: I have reviewed the patients History and Physical, chart, labs and discussed the procedure including the risks, benefits and alternatives for the proposed anesthesia with the patient or authorized representative who has indicated his/her understanding and acceptance.     Plan Discussed with: CRNA and Surgeon  Anesthesia Plan Comments:         Anesthesia Quick Evaluation

## 2016-02-06 NOTE — Anesthesia Postprocedure Evaluation (Addendum)
Anesthesia Post Note  Patient: Jacqueline Ortiz  Procedure(s) Performed: Procedure(s) (LRB): XI ROBOTIC ASSISTED HIATAL HERNIA REPAIR WITH MESH AND NISSEN FUNDOPLICATION (N/A)  Patient location during evaluation: PACU Anesthesia Type: General Level of consciousness: sedated and patient cooperative Pain management: pain level controlled Vital Signs Assessment: post-procedure vital signs reviewed and stable Respiratory status: spontaneous breathing Cardiovascular status: stable Anesthetic complications: no       Last Vitals:  Vitals:   02/06/16 1646 02/06/16 1800  BP: (!) 141/75 136/72  Pulse: (!) 104 92  Resp: 15 16  Temp: 36.9 C 36.3 C    Last Pain:  Vitals:   02/06/16 1800  TempSrc: Oral  PainSc:                  [name]

## 2016-02-06 NOTE — H&P (Addendum)
History of Present Illness  Patient words: hiatal hernia.  The patient is a 71 year old female who presents with a hiatal hernia. Patient is a 71 year old female who is referred by Dr. Silvano Rusk for evaluation of a hiatal hernia. Patient states that she's had multiple year history of dysphagia. Patient was initially diagnosed with a esophageal stricture which have been dilated in the past. Patient states that recently, 2 months ago, she presented ER secondary dysphagia. She had a foreign body obstruction that required EGD removal. 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  Patient does state that recently her Protonix has been doubled. She states that she does have some reflux. It does not appear that she has any water brash. She does not feel that she has any shortness of breath.  Upper GI results reveal: Large hiatal hernia containing stomach and proximal duodenum without ulceration or mass identified. Greater curvature of the stomach remains located inferiorly. Significant delay in emptying of contrast from the displaced stomach and duodenum.   She's had a previous C-section and knee surgery in the past.   Other Problems  Anxiety Disorder Arthritis Back Pain Diverticulosis Gastroesophageal Reflux Disease Hemorrhoids Hypercholesterolemia Kidney Stone Other disease, cancer, significant illness  Past Surgical History  Cesarean Section - 1 Colon Polyp Removal - Colonoscopy Foot Surgery Left. Knee Surgery Right. Oral Surgery Tonsillectomy  Diagnostic Studies History  Mammogram within last year Pap Smear 1-5 years ago  Allergies  No Known Drug Allergies11/07/2015  Medication History  Pantoprazole Sodium (40MG  Tablet DR, Oral) Active. Zocor (10MG  Tablet, Oral) Active. Advil (100MG  Tablet Chewable, Oral)  Active. Calcium-Magnesium (500-250MG  Tablet, Oral) Active. Medications Reconciled  Social History  Caffeine use Carbonated beverages, Tea. No alcohol use No drug use Tobacco use Never smoker.  Family History Arthritis Father, Mother. Cerebrovascular Accident Brother. Colon Polyps Brother, Mother. Diabetes Mellitus Mother. Heart Disease Father, Mother. Hypertension Father.  Pregnancy / Birth History Age at menarche 69 years. Age of menopause 3-55 Gravida 2 Irregular periods Maternal age 32-20 Para 1    Review of Systems  General Not Present- Appetite Loss, Chills, Fatigue, Fever, Night Sweats, Weight Gain and Weight Loss. Skin Not Present- Change in Wart/Mole, Dryness, Hives, Jaundice, New Lesions, Non-Healing Wounds, Rash and Ulcer. HEENT Present- Seasonal Allergies and Wears glasses/contact lenses. Not Present- Earache, Hearing Loss, Hoarseness, Nose Bleed, Oral Ulcers, Ringing in the Ears, Sinus Pain, Sore Throat, Visual Disturbances and Yellow Eyes. Respiratory Not Present- Bloody sputum, Chronic Cough, Difficulty Breathing, Snoring and Wheezing. Breast Not Present- Breast Mass, Breast Pain, Nipple Discharge and Skin Changes. Cardiovascular Not Present- Chest Pain, Difficulty Breathing Lying Down, Leg Cramps, Palpitations, Rapid Heart Rate, Shortness of Breath and Swelling of Extremities. Gastrointestinal Present- Bloating, Excessive gas, Hemorrhoids and Indigestion. Not Present- Abdominal Pain, Bloody Stool, Change in Bowel Habits, Chronic diarrhea, Constipation, Difficulty Swallowing, Gets full quickly at meals, Nausea, Rectal Pain and Vomiting. Female Genitourinary Not Present- Frequency, Nocturia, Painful Urination, Pelvic Pain and Urgency. Musculoskeletal Present- Back Pain and Joint Pain. Not Present- Joint Stiffness, Muscle Pain, Muscle Weakness and Swelling of Extremities. Neurological Not Present- Decreased Memory, Fainting, Headaches,  Numbness, Seizures, Tingling, Tremor, Trouble walking and Weakness. Psychiatric Present- Anxiety. Not Present- Bipolar, Change in Sleep Pattern, Depression, Fearful and Frequent crying. Endocrine Present- Cold Intolerance. Not Present- Excessive Hunger, Hair Changes, Heat Intolerance, Hot flashes and New Diabetes. Hematology Not Present- Blood Thinners, Easy Bruising, Excessive bleeding, Gland  problems, HIV and Persistent Infections.  BP 126/76   Pulse (!) 109   Temp 98 F (36.7 C) (Oral)   Resp 16   Ht 5\' 2"  (1.575 m)   Wt 53.5 kg (118 lb)   SpO2 99%   BMI 21.58 kg/m      Physical Exam ( Mental Status-Alert. General Appearance-Consistent with stated age. Hydration-Well hydrated. Voice-Normal.  Head and Neck Head-normocephalic, atraumatic with no lesions or palpable masses. Trachea-midline. Thyroid Gland Characteristics - normal size and consistency.  Eye Eyeball - Bilateral-Extraocular movements intact. Sclera/Conjunctiva - Bilateral-No scleral icterus.  Chest and Lung Exam Chest and lung exam reveals -quiet, even and easy respiratory effort with no use of accessory muscles and on auscultation, normal breath sounds, no adventitious sounds and normal vocal resonance. Inspection Chest Wall - Normal. Back - normal.  Breast Breast - Left-Symmetric, Non Tender, No Biopsy scars, no Dimpling, No Inflammation, No Lumpectomy scars, No Mastectomy scars, No Peau d' Orange. Breast - Right-Symmetric, Non Tender, No Biopsy scars, no Dimpling, No Inflammation, No Lumpectomy scars, No Mastectomy scars, No Peau d' Orange. Breast Lump-No Palpable Breast Mass.  Cardiovascular Cardiovascular examination reveals -normal heart sounds, regular rate and rhythm with no murmurs and normal pedal pulses bilaterally.  Abdomen Inspection Inspection of the abdomen reveals - No Hernias. Skin - Scar - no surgical scars. Palpation/Percussion Palpation and  Percussion of the abdomen reveal - Soft, Non Tender, No Rebound tenderness, No Rigidity (guarding) and No hepatosplenomegaly. Auscultation Auscultation of the abdomen reveals - Bowel sounds normal.  Neurologic Neurologic evaluation reveals -alert and oriented x 3 with no impairment of recent or remote memory. Mental Status-Normal.  Musculoskeletal Normal Exam - Left-Upper Extremity Strength Normal and Lower Extremity Strength Normal. Normal Exam - Right-Upper Extremity Strength Normal and Lower Extremity Strength Normal.  Lymphatic Head & Neck  General Head & Neck Lymphatics: Bilateral - Description - Normal. Axillary  General Axillary Region: Bilateral - Description - Normal. Tenderness - Non Tender. Femoral & Inguinal  Generalized Femoral & Inguinal Lymphatics: Bilateral - Description - Normal. Tenderness - Non Tender.    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  1. We will proceed with Robotic hiatal hernia repair and Nissen fundoplication  2. Essentially the risks benefits proceeded to include an elliptical infection, bleeding, demonstrating shortness, possible recurrence, possible need for drain placement. Patient was understanding and wished to proceed.

## 2016-02-07 ENCOUNTER — Inpatient Hospital Stay (HOSPITAL_COMMUNITY): Payer: Medicare Other

## 2016-02-07 LAB — BASIC METABOLIC PANEL
ANION GAP: 5 (ref 5–15)
BUN: 8 mg/dL (ref 6–20)
CALCIUM: 8.1 mg/dL — AB (ref 8.9–10.3)
CO2: 26 mmol/L (ref 22–32)
Chloride: 106 mmol/L (ref 101–111)
Creatinine, Ser: 0.63 mg/dL (ref 0.44–1.00)
GFR calc Af Amer: >60 mL/min (ref >60)
GFR calc non Af Amer: >60 mL/min (ref >60)
GLUCOSE: 170 mg/dL — AB (ref 65–99)
Potassium: 3 mmol/L — ABNORMAL LOW (ref 3.5–5.1)
Sodium: 137 mmol/L (ref 135–145)

## 2016-02-07 MED ORDER — KCL IN DEXTROSE-NACL 40-5-0.9 MEQ/L-%-% IV SOLN
INTRAVENOUS | Status: DC
  Administered 2016-02-07: 09:00:00 via INTRAVENOUS
  Filled 2016-02-07 (×3): qty 1000

## 2016-02-07 MED ORDER — IOPAMIDOL (ISOVUE-300) INJECTION 61%
150.0000 mL | Freq: Once | INTRAVENOUS | Status: AC | PRN
  Administered 2016-02-07: 25 mL via INTRAVENOUS

## 2016-02-07 MED ORDER — KETOROLAC TROMETHAMINE 15 MG/ML IJ SOLN
15.0000 mg | Freq: Four times a day (QID) | INTRAMUSCULAR | Status: DC | PRN
  Administered 2016-02-07 (×2): 15 mg via INTRAVENOUS
  Filled 2016-02-07: qty 1

## 2016-02-07 MED ORDER — IOPAMIDOL (ISOVUE-300) INJECTION 61%
INTRAVENOUS | Status: AC
  Filled 2016-02-07: qty 150

## 2016-02-07 MED ORDER — ACETAMINOPHEN 325 MG PO TABS: 325.0000 mg | ORAL_TABLET | Freq: Four times a day (QID) | ORAL | Status: DC | PRN

## 2016-02-07 MED ORDER — ATORVASTATIN CALCIUM 20 MG PO TABS
20.0000 mg | ORAL_TABLET | Freq: Every day | ORAL | Status: DC
  Administered 2016-02-07: 20 mg via ORAL
  Filled 2016-02-07: qty 1

## 2016-02-07 NOTE — Progress Notes (Signed)
1 Day Post-Op  Subjective: Doing well with some sorness.  ambulatign in hall  Objective: Vital signs in last 24 hours: Temp:  [97.4 F (36.3 C)-98.4 F (36.9 C)] 97.7 F (36.5 C) (01/11 0623) Pulse Rate:  [80-120] 106 (01/11 0623) Resp:  [15-18] 18 (01/11 0623) BP: (119-141)/(70-86) 123/75 (01/11 0623) SpO2:  [97 %-100 %] 97 % (01/11 0623) Weight:  [53.5 kg (118 lb)] 53.5 kg (118 lb) (01/10 1028) Last BM Date: 02/06/16  Intake/Output from previous day: 01/10 0701 - 01/11 0700 In: 3016.7 [I.V.:3016.7] Out: 975 [Urine:950; Blood:25] Intake/Output this shift: No intake/output data recorded.  General appearance: alert and cooperative GI: soft approp ttp , NS  Lab Results:  No results for input(s): WBC, HGB, HCT, PLT in the last 72 hours. BMET  Recent Labs  02/07/16 0458  NA 137  K 3.0*  CL 106  CO2 26  GLUCOSE 170*  BUN 8  CREATININE 0.63  CALCIUM 8.1*   PT/INR No results for input(s): LABPROT, INR in the last 72 hours. ABG No results for input(s): PHART, HCO3 in the last 72 hours.  Invalid input(s): PCO2, PO2  Studies/Results: No results found.  Anti-infectives: Anti-infectives    Start     Dose/Rate Route Frequency Ordered Stop   02/06/16 1009  ceFAZolin (ANCEF) IVPB 2g/100 mL premix     2 g 200 mL/hr over 30 Minutes Intravenous On call to O.R. 02/06/16 1009 02/06/16 1239      Assessment/Plan: s/p Procedure(s): XI ROBOTIC ASSISTED HIATAL HERNIA REPAIR WITH MESH AND NISSEN FUNDOPLICATION (N/A) Await esophagogram.  If OK will Start [employer] later today if doing well   LOS: 1 day    Rosario Jacks., [facility] 02/07/2016

## 2016-02-07 NOTE — Progress Notes (Signed)
Patient resting in bed with heating pad at posterior neck because of history of cervical disc pain.  Patient states that abdominal pain is at 4-5/10 at rest, but increases to 9-10/10 when she ambulates.  Patient refused narcotics. Physician paged to possibly get order for Toradol for pain issues.

## 2016-02-07 NOTE — Plan of Care (Signed)
Problem: Food- and Nutrition-Related Knowledge Deficit (NB-1.1) Goal: Nutrition education Formal process to instruct or train a patient/client in a skill or to impart knowledge to help patients/clients voluntarily manage or modify food choices and eating behavior to maintain or improve health. Outcome: Completed/Met Date Met: 02/07/16 Nutrition Education Note  RD consulted for nutrition education regarding patient who is s/p nissen fundoplication.  RD provided handout on Nissen Fundoplication nutrition therapy. Reviewed clear and full liquids. Encouraged pt to avoid caffeine, carbonated beverages and use of straws.  Provided examples of appropriate foods on a soft diet. Reviewed gas producing foods. Discouraged intake of processed foods and red meat. Recommended use of a liquid multivitamin while following a liquid diet. Reviewed acceptable protein supplements.  RD discussed why it is important for patient to adhere to diet recommendations. Teach back method used.  Expect good compliance.   Body mass index is 21.58 kg/m.  Pt meets criteria for normal based on current BMI.  Current diet order is clear liquid. Labs and medications reviewed. No further nutrition interventions warranted at this time. If additional nutrition issues arise, please re-consult RD.   Lindsey Baker, MS, RD, LDN Pager: [phone] After Hours Pager: [phone]      

## 2016-02-08 NOTE — Care Management Note (Signed)
Case Management Note  Patient Details  Name: Jacqueline Ortiz MRN: [id number] Date of Birth: 1945-02-03  Subjective/Objective:  No referrals,orders or CM needs.                  Action/Plan:d/c home.   Expected Discharge Date:  02/08/16               Expected Discharge Plan:  Home/Self Care  In-House Referral:     Discharge planning Services  CM Consult  Post Acute Care Choice:    Choice offered to:     DME Arranged:    DME Agency:     HH Arranged:    HH Agency:     Status of Service:  Completed, signed off  If discussed at [employer] of Stay Meetings, dates discussed:    Additional Comments:  Dessa Phi, RN 02/08/2016, 10:40 AM

## 2016-02-08 NOTE — Discharge Summary (Signed)
Physician Discharge Summary  Patient ID: Jacqueline Ortiz MRN: [id number] DOB/AGE: [birth year] 71 y.o.  Admit date: 02/06/2016 Discharge date: 02/08/2016  Admission Diagnoses:Hiatal hernia  Discharge Diagnoses:  Active Problems:   S/P Nissen fundoplication (with gastrostomy tube placement) [facility])   Discharged Condition: good  Hospital Course: Patient is a 71 year old female who presented with a large hernia. Patient was status post robotic coronary hernia repair and Nissen fundoplication. Please operative note for full details.  Postoperatively patient was sent to the floor. Patient underwent esophagram on postoperative day 1. This revealed no injury to the esophagus. Patient was started clear liquid diet. Patient then underwent diabetic education by the dietitian. Patient otherwise was started on home medications which she had crushed. Patient good pain control. She examined amiodarone. She is otherwise deemed stable for discharge and discharged home.  Consults: Dietitian-added to dictation  Significant Diagnostic Studies: Esophagram: No esophageal leak  Treatments: surgery: As above  Discharge Exam: Blood pressure (!) 142/83, pulse (!) 109, temperature 98.5 F (36.9 C), temperature source Oral, resp. rate 18, height 5\' 2"  (1.575 m), weight 53.5 kg (118 lb), SpO2 93 %. General appearance: alert and cooperative Cardio: regular rate and rhythm, S1, S2 normal, no murmur, click, rub or gallop GI: soft, non-tender; bowel sounds normal; no masses,  no organomegaly  Disposition: 01-Home or Self Care  Discharge Instructions    Diet - low sodium heart healthy    Complete by:  As directed    Increase activity slowly    Complete by:  As directed      Allergies as of 02/08/2016   No Known Allergies     Medication List    TAKE these medications   atorvastatin 20 MG tablet Commonly known as:  LIPITOR Take 20 mg by mouth daily.   Calcium-Magnesium-Vitamin D [id number] MG-MG-UNIT  Chew Chew 2 capsules by mouth 2 (two) times daily.   ibuprofen 200 MG tablet Commonly known as:  ADVIL,MOTRIN Take 200 mg by mouth daily. Pt is using Advil liquid until past procedure.   pantoprazole 40 MG tablet Commonly known as:  PROTONIX Take 40 mg by mouth 2 (two) times daily.   pseudoephedrine 30 MG tablet Commonly known as:  SUDAFED Take 30 mg by mouth 2 (two) times daily.      Follow-up Information    Reyes Ivan, MD. Schedule an appointment as soon as possible for a visit in 2 week(s).   Specialty:  General Surgery Why:  For wound re-check Contact information: [address] [phone]           Signed: Rosario Jacks., [facility] 02/08/2016, 8:44 AM

## 2016-09-19 ENCOUNTER — Other Ambulatory Visit: Payer: Self-pay | Admitting: Internal Medicine

## 2016-09-19 DIAGNOSIS — Z1231 Encounter for screening mammogram for malignant neoplasm of breast: Secondary | ICD-10-CM

## 2016-11-03 ENCOUNTER — Ambulatory Visit
Admission: RE | Admit: 2016-11-03 | Discharge: 2016-11-03 | Disposition: A | Discharge status: Home / Self Care | Payer: Medicare Other | Source: Ambulatory Visit | Attending: Internal Medicine | Admitting: Internal Medicine

## 2016-11-03 DIAGNOSIS — Z1231 Encounter for screening mammogram for malignant neoplasm of breast: Secondary | ICD-10-CM

## 2017-01-17 IMAGING — RF DG UGI W/ HIGH DENSITY W/KUB
3 series · 3 of 3 positions shown · non-contrast
Comparison: None.

CLINICAL DATA: 70-year-old female with paraesophageal hernia. Post
endoscopy. Initial encounter.

EXAM:
UPPER GI SERIES WITH KUB
TECHNIQUE: After obtaining a scout radiograph a routine upper GI series was
performed using thin and high density barium.
FLUOROSCOPY TIME:  Fluoroscopy Time:  3 minutes and 7 seconds
Radiation Exposure Index (if provided by the fluoroscopic device):
21.76 mGy
Number of Acquired Spot Images: 14

[Series 1: run · 1 of 1 slices shown (1 of 3)]
[im 1/1]
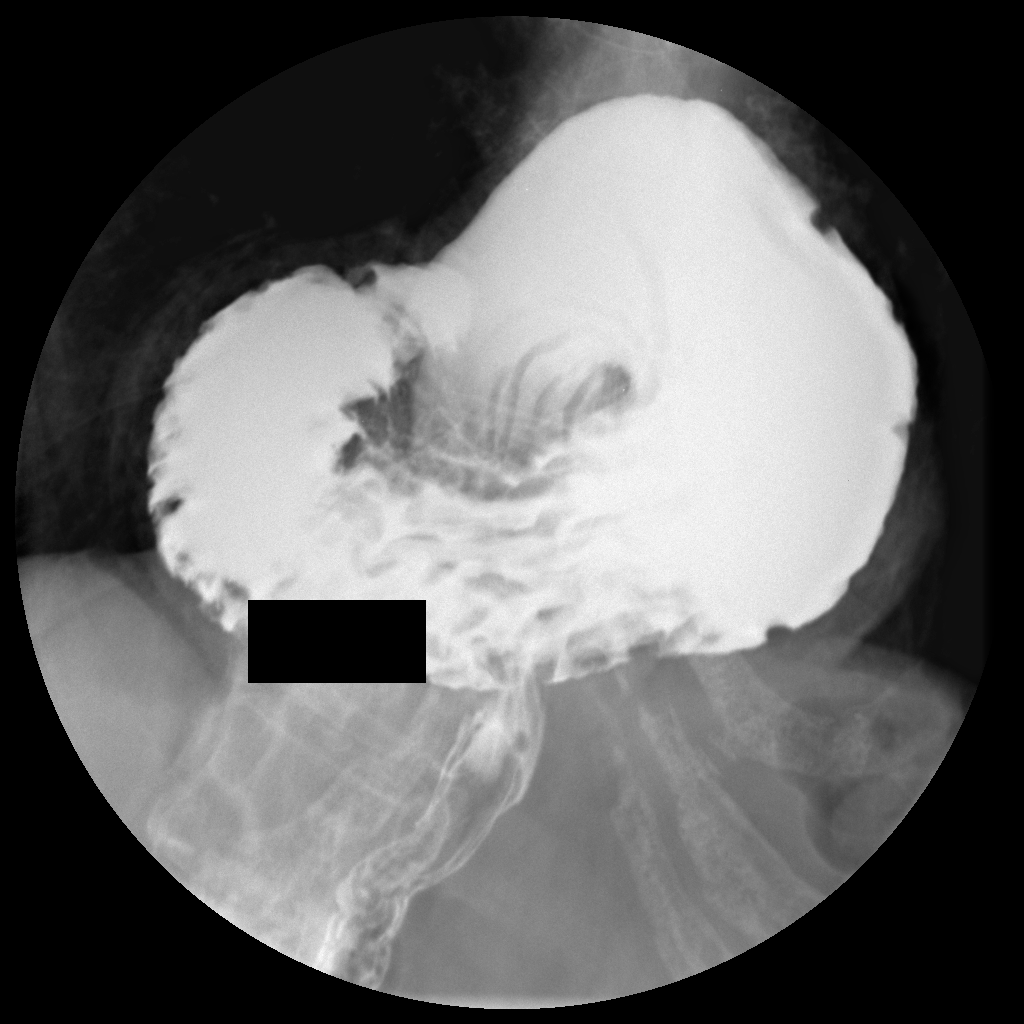

[Series 2: run · 1 of 1 slices shown (2 of 3)]
[im 1/1]
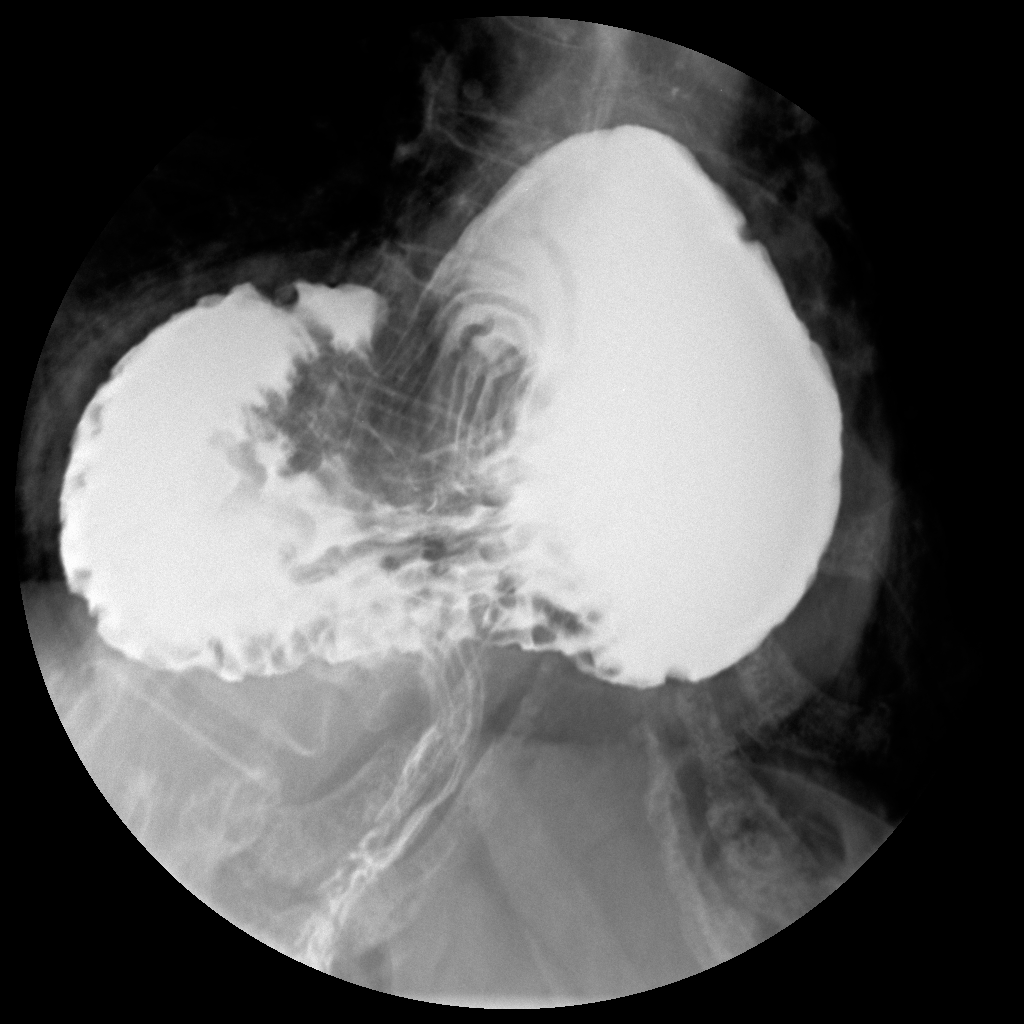

[Series 3: run · 1 of 1 slices shown (3 of 3)]
[im 1/1]
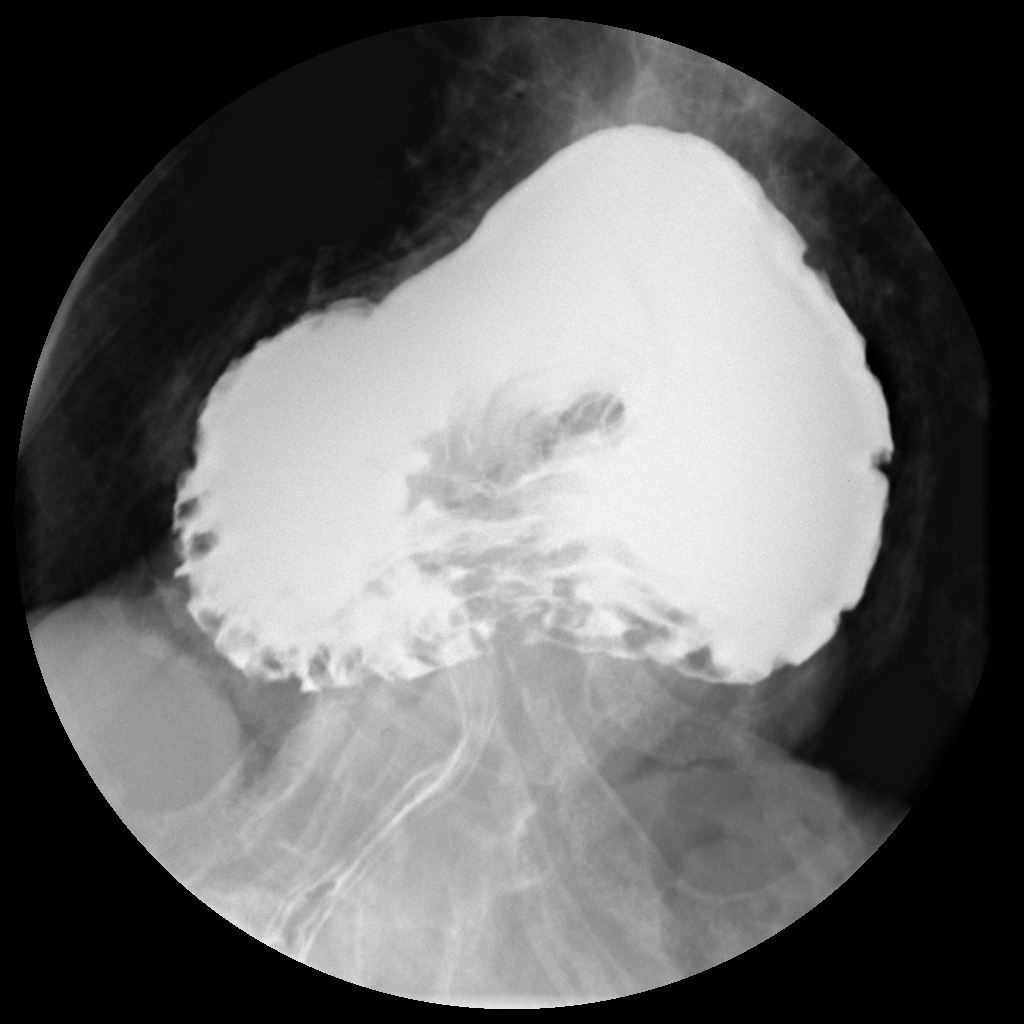

[3 of 3 positions shown; findings below may reference images not displayed]

FINDINGS: Scout view reveals prominent scoliosis convex right with
superimposed degenerative changes.

Esophageal tertiary wave contractions without discrete mucosa
abnormality.

Large hiatal hernia containing stomach and proximal duodenum. There
is mild smooth narrowing at the level of the gastroesophageal
junction. Patient was not able to ingest a 13 mm barium tablet.

Significant delay in emptying of contrast from the displaced stomach
and duodenum.

No gastric ulceration or mass. The greater curvature of the stomach
remains inferiorly located.

Complex distal duodenal diverticula. Remainder of proximally
visualized small bowel unremarkable.
IMPRESSION: Large hiatal hernia containing stomach and proximal duodenum without
ulceration or mass identified. Greater curvature of the stomach
remains located inferiorly. Significant delay in emptying of
contrast from the displaced stomach and duodenum.

Tertiary wave contractions of the esophagus. Smooth narrowing at the
level of the displaced gastroesophageal junction.

Patient was not able to ingest a 13 mm barium tablet.

Complex distal duodenal diverticula.

Scoliosis lumbar spine convex right.

## 2017-09-24 ENCOUNTER — Other Ambulatory Visit: Payer: Self-pay | Admitting: Internal Medicine

## 2017-09-24 DIAGNOSIS — Z1231 Encounter for screening mammogram for malignant neoplasm of breast: Secondary | ICD-10-CM

## 2017-11-06 ENCOUNTER — Ambulatory Visit
Admission: RE | Admit: 2017-11-06 | Discharge: 2017-11-06 | Disposition: A | Discharge status: Home / Self Care | Payer: Medicare Other | Source: Ambulatory Visit | Attending: Internal Medicine | Admitting: Internal Medicine

## 2017-11-06 DIAGNOSIS — Z1231 Encounter for screening mammogram for malignant neoplasm of breast: Secondary | ICD-10-CM

## 2018-09-29 ENCOUNTER — Other Ambulatory Visit: Payer: Self-pay | Admitting: Internal Medicine

## 2018-09-29 DIAGNOSIS — Z1231 Encounter for screening mammogram for malignant neoplasm of breast: Secondary | ICD-10-CM

## 2018-11-12 ENCOUNTER — Ambulatory Visit: Payer: Medicare Other

## 2018-12-13 ENCOUNTER — Ambulatory Visit
Admission: RE | Admit: 2018-12-13 | Discharge: 2018-12-13 | Disposition: A | Discharge status: Home / Self Care | Payer: Medicare Other | Source: Ambulatory Visit | Attending: Internal Medicine | Admitting: Internal Medicine

## 2018-12-13 ENCOUNTER — Other Ambulatory Visit: Payer: Self-pay

## 2018-12-13 DIAGNOSIS — Z1231 Encounter for screening mammogram for malignant neoplasm of breast: Secondary | ICD-10-CM

## 2019-01-28 DIAGNOSIS — I10 Essential (primary) hypertension: Secondary | ICD-10-CM

## 2019-01-28 HISTORY — DX: Essential (primary) hypertension: I10

## 2019-03-19 ENCOUNTER — Ambulatory Visit: Payer: Medicare Other | Attending: Internal Medicine

## 2019-03-19 DIAGNOSIS — Z23 Encounter for immunization: Secondary | ICD-10-CM | POA: Insufficient documentation

## 2019-03-19 NOTE — Progress Notes (Signed)
   Covid-19 Vaccination Clinic  Name:  Jacqueline Ortiz    MRN: [id number] DOB: September 27, 1945  03/19/2019  [name] was observed post Covid-19 immunization for 15 minutes without incidence. She was provided with Vaccine Information Sheet and instruction to access the V-Safe system.   [name] was instructed to call 911 with any severe reactions post vaccine: Marland Kitchen Difficulty breathing  . Swelling of your face and throat  . A fast heartbeat  . A bad rash all over your body  . Dizziness and weakness    Immunizations Administered    Name Date Dose VIS Date Route   Pfizer COVID-19 Vaccine 03/19/2019  9:56 AM 0.3 mL 01/07/2019 Intramuscular   Manufacturer: [place]   Lot: [id number]   [place]: [id number]

## 2019-04-12 ENCOUNTER — Ambulatory Visit: Payer: Medicare Other | Attending: Internal Medicine

## 2019-04-12 DIAGNOSIS — Z23 Encounter for immunization: Secondary | ICD-10-CM

## 2019-04-12 NOTE — Progress Notes (Signed)
   Covid-19 Vaccination Clinic  Name:  Jacqueline Ortiz    MRN: [id number] DOB: Sep 22, 1945  04/12/2019  [name] was observed post Covid-19 immunization for 15 minutes without incident. She was provided with Vaccine Information Sheet and instruction to access the V-Safe system.   [name] was instructed to call 911 with any severe reactions post vaccine: Marland Kitchen Difficulty breathing  . Swelling of face and throat  . A fast heartbeat  . A bad rash all over body  . Dizziness and weakness   Immunizations Administered    Name Date Dose VIS Date Route   Pfizer COVID-19 Vaccine 04/12/2019  1:33 PM 0.3 mL 01/07/2019 Intramuscular   Manufacturer: [place]   Lot: [id number]   [place]: [id number]

## 2019-11-03 ENCOUNTER — Other Ambulatory Visit: Payer: Self-pay | Admitting: Internal Medicine

## 2019-11-03 DIAGNOSIS — Z1231 Encounter for screening mammogram for malignant neoplasm of breast: Secondary | ICD-10-CM

## 2019-11-26 ENCOUNTER — Ambulatory Visit: Payer: Medicare Other

## 2019-12-10 ENCOUNTER — Ambulatory Visit: Payer: Medicare Other | Attending: Internal Medicine

## 2019-12-10 DIAGNOSIS — Z23 Encounter for immunization: Secondary | ICD-10-CM

## 2019-12-10 NOTE — Progress Notes (Signed)
   Covid-19 Vaccination Clinic  Name:  Jacqueline Ortiz    MRN: [id number] DOB: 1945-11-13  12/10/2019  [name] was observed post Covid-19 immunization for 15 minutes without incident. She was provided with Vaccine Information Sheet and instruction to access the V-Safe system.   [name] was instructed to call 911 with any severe reactions post vaccine: Marland Kitchen Difficulty breathing  . Swelling of face and throat  . A fast heartbeat  . A bad rash all over body  . Dizziness and weakness   Immunizations Administered    Name Date Dose VIS Date Route   Pfizer COVID-19 Vaccine 12/10/2019  2:17 PM 0.3 mL 11/16/2019 Intramuscular   Manufacturer: [place]   Lot: [id number]   [place]: [id number]

## 2019-12-15 ENCOUNTER — Ambulatory Visit: Payer: Medicare Other

## 2020-01-24 ENCOUNTER — Ambulatory Visit
Admission: RE | Admit: 2020-01-24 | Discharge: 2020-01-24 | Disposition: A | Discharge status: Home / Self Care | Payer: Medicare Other | Source: Ambulatory Visit | Attending: Internal Medicine | Admitting: Internal Medicine

## 2020-01-24 ENCOUNTER — Other Ambulatory Visit: Payer: Self-pay

## 2020-01-24 DIAGNOSIS — Z1231 Encounter for screening mammogram for malignant neoplasm of breast: Secondary | ICD-10-CM

## 2020-05-20 ENCOUNTER — Encounter: Payer: Self-pay | Admitting: Internal Medicine

## 2020-06-13 ENCOUNTER — Encounter: Payer: Self-pay | Admitting: Internal Medicine

## 2020-08-21 ENCOUNTER — Ambulatory Visit (AMBULATORY_SURGERY_CENTER): Payer: Medicare Other

## 2020-08-21 ENCOUNTER — Other Ambulatory Visit: Payer: Self-pay

## 2020-08-21 VITALS — Ht 63.0 in | Wt 127.0 lb

## 2020-08-21 DIAGNOSIS — Z86010 Personal history of colonic polyps: Secondary | ICD-10-CM

## 2020-08-21 NOTE — Progress Notes (Signed)
Patient's pre-visit was done today over the phone with the patient    Name,DOB and address verified.  Patient denies any allergies to Eggs and Soy. Patient denies any problems with anesthesia/sedation. Patient denies taking diet pills or blood thinners. No home Oxygen. Packet of Prep instructions mailed to patient including a copy of a consent form-pt is aware. Patient understands to call us back with any questions or concerns. Patient is aware of our care-partner policy and 0000000 safety protocol.

## 2020-09-03 ENCOUNTER — Encounter: Payer: Self-pay | Admitting: Internal Medicine

## 2020-09-04 ENCOUNTER — Encounter: Payer: Self-pay | Admitting: Internal Medicine

## 2020-09-04 ENCOUNTER — Other Ambulatory Visit: Payer: Self-pay

## 2020-09-04 ENCOUNTER — Ambulatory Visit (AMBULATORY_SURGERY_CENTER): Payer: Medicare Other | Admitting: Internal Medicine

## 2020-09-04 VITALS — BP 128/77 | HR 76 | Temp 96.8°F | Resp 19 | Ht 63.0 in | Wt 127.0 lb

## 2020-09-04 DIAGNOSIS — Z86010 Personal history of colonic polyps: Secondary | ICD-10-CM | POA: Diagnosis not present

## 2020-09-04 MED ORDER — SODIUM CHLORIDE 0.9 % IV SOLN: 500.0000 mL | Freq: Once | INTRAVENOUS | Status: DC

## 2020-09-04 NOTE — Op Note (Signed)
[facility] Patient Name: Jacqueline Ortiz Procedure Date: 09/04/2020 11:13 AM MRN: [id number] Endoscopist: Gatha Mayer , MD Age: 75 Referring MD:  Date of Birth: 1945/06/21 Gender: Female Account #: [id number] Procedure:                Colonoscopy Indications:              Surveillance: Personal history of adenomatous                            polyps on last colonoscopy > 5 years ago Medicines:                Propofol per Anesthesia, Monitored Anesthesia Care Procedure:                Pre-Anesthesia Assessment:                           - Prior to the procedure, a History and Physical                            was performed, and patient medications and                            allergies were reviewed. The patient's tolerance of                            previous anesthesia was also reviewed. The risks                            and benefits of the procedure and the sedation                            options and risks were discussed with the patient.                            All questions were answered, and informed consent                            was obtained. Prior Anticoagulants: The patient has                            taken no previous anticoagulant or antiplatelet                            agents. ASA Grade Assessment: II - A patient with                            mild systemic disease. After reviewing the risks                            and benefits, the patient was deemed in                            satisfactory condition to undergo the procedure.  After obtaining informed consent, the colonoscope                            was passed under direct vision. Throughout the                            procedure, the patient's blood pressure, pulse, and                            oxygen saturations were monitored continuously. The                            Olympus [id number] ([id number]) Colonoscope was                             introduced through the anus and advanced to the the                            cecum, identified by appendiceal orifice and                            ileocecal valve. Scope In: 11:47:45 AM Scope Out: 12:00:12 PM Scope Withdrawal Time: 0 hours 4 minutes 58 seconds  Total Procedure Duration: 0 hours 12 minutes 27 seconds  Findings:                 Hemorrhoids were found on perianal exam.                           Many small and large-mouthed diverticula were found                            in the sigmoid colon and descending colon.                           External and internal hemorrhoids were found.                           The exam was otherwise without abnormality on                            direct and retroflexion views. Complications:            No immediate complications. Estimated Blood Loss:     Estimated blood loss: none. Impression:               - Hemorrhoids found on perianal exam.                           - Diverticulosis in the sigmoid colon and in the                            descending colon.                           - External and internal hemorrhoids.                           -  The examination was otherwise normal on direct                            and retroflexion views.                           - No specimens collected.                           - Personal history of colonic polyps. diminutive                            adenoma 2006, 7 mm TV adenoma 2013, no polyps 2016 Recommendation:           - Patient has a contact number available for                            emergencies. The signs and symptoms of potential                            delayed complications were discussed with the                            patient. Return to normal activities tomorrow.                            Written discharge instructions were provided to the                            patient.                           - Resume previous diet.                           -  Continue present medications.                           - No repeat colonoscopy due to current age (79                            years or older) and the absence of colonic polyps. Gatha Mayer, MD 09/04/2020 12:06:55 PM This report has been signed electronically.

## 2020-09-04 NOTE — Progress Notes (Signed)
Sedate, good SR's, VSS

## 2020-09-04 NOTE — Progress Notes (Signed)
AS - VS  Pt's states no medical or surgical changes since previsit or office visit.

## 2020-09-04 NOTE — Progress Notes (Signed)
Patient ID: Jacqueline Ortiz, female   DOB: 17-Jan-1946, 75 y.o.   MRN: [id number] [facility] History and Physical   Primary Care Physician:  Burnard Bunting, MD   Reason for Procedure:   Hx colon polyps  Plan:    colonoscopy     HPI: Jacqueline Ortiz is a 75 y.o. 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 Silvano Rusk    Past Surgical History:  Procedure Laterality Date   CESAREAN SECTION     COLONOSCOPY  multiple   Dr. Silvano Rusk   DILATION AND CURETTAGE OF UTERUS     ESOPHAGOGASTRODUODENOSCOPY  02/02/2003   Dr. Silvano Rusk   ESOPHAGOGASTRODUODENOSCOPY N/A 10/21/2015   Procedure: ESOPHAGOGASTRODUODENOSCOPY (EGD);  Surgeon: Milus Banister, MD;  Location: [facility];  Service: Endoscopy;  Laterality: N/A;   KNEE SURGERY Right    right fx- retained hardware   TONSILLECTOMY     TUBAL LIGATION     UPPER GASTROINTESTINAL ENDOSCOPY     vocal cord surgery     "did improve condition"    Prior to Admission medications   Medication Sig Start Date End Date Taking? Authorizing Provider  atorvastatin (LIPITOR) 20 MG tablet Take 20 mg by mouth daily.  09/05/15  Yes [provider]  escitalopram (LEXAPRO) 10 MG  tablet Take 10 mg by mouth daily. 08/02/20  Yes [provider]  Ketotifen Fumarate (REFRESH EYE [place] OP) Place into both eyes as needed.   Yes [provider]  pantoprazole (PROTONIX) 40 MG tablet Take 40 mg by mouth 2 (two) times daily.   Yes [provider]  triamterene-hydrochlorothiazide (MAXZIDE-25) 37.5-25 MG tablet Take 1 tablet by mouth daily. 07/17/20  Yes [provider]  Calcium-Magnesium-Vitamin D 500-50-100 MG-MG-UNIT CHEW Chew 2 capsules by mouth 2 (two) times daily.    [provider]  ibuprofen (ADVIL,MOTRIN) 200 MG tablet Take 200 mg by mouth daily. Pt is using Advil liquid until past procedure.    [provider]  pseudoephedrine (SUDAFED) 30 MG tablet Take 30 mg by mouth 2 (two) times daily. Patient not taking: No sig reported    [provider]    Current Outpatient Medications  Medication Sig Dispense Refill   atorvastatin (LIPITOR) 20 MG tablet Take 20 mg by mouth daily.      escitalopram (LEXAPRO) 10 MG tablet Take 10 mg by mouth daily.     Ketotifen Fumarate (REFRESH EYE ITCH RELIEF OP) Place into both eyes as needed.     pantoprazole (PROTONIX) 40 MG tablet Take 40 mg by mouth  2 (two) times daily.     triamterene-hydrochlorothiazide (MAXZIDE-25) 37.5-25 MG tablet Take 1 tablet by mouth daily.     Calcium-Magnesium-Vitamin D [id number] MG-MG-UNIT CHEW Chew 2 capsules by mouth 2 (two) times daily.     ibuprofen (ADVIL,MOTRIN) 200 MG tablet Take 200 mg by mouth daily. Pt is using Advil liquid until past procedure.     pseudoephedrine (SUDAFED) 30 MG tablet Take 30 mg by mouth 2 (two) times daily. (Patient not taking: No sig reported)     Current Facility-Administered Medications  Medication Dose Route Frequency Provider Last Rate Last Admin   0.9 %  sodium chloride infusion  500 mL Intravenous Once Gatha Mayer, MD        Allergies as of 09/04/2020   (No Known Allergies)    Family History   Problem Relation Age of Onset   Heart disease Mother    Diabetes Mother    Heart disease Father    Colon cancer Maternal Grandmother    Colon polyps Brother    Cancer Other        gastric   Heart disease Sister    Esophageal cancer Neg Hx    Rectal cancer Neg Hx    Stomach cancer Neg Hx    Breast cancer Neg Hx     Social History   Socioeconomic History   Marital status: Married    Spouse name: Not on file   Number of children: 1   Years of education: Not on file   Highest education level: Not on file  Occupational History    Employer: [employer]  Tobacco Use   Smoking status: Never   Smokeless tobacco: Never  Vaping Use   Vaping Use: Never used  Substance and Sexual Activity   Alcohol use: No   Drug use: No   Sexual activity: Not on file  Other Topics Concern   Not on file  Social History Narrative   Not on file    Review of Systems: All other review of systems negative except as mentioned in the HPI.  Physical Exam: Vital signs in last 24 hours: '@VSRANGES'$ @   General:   Alert,  Well-developed, well-nourished, pleasant and cooperative in NAD Lungs:  Clear throughout to auscultation.   Heart:  Regular rate and rhythm; no murmurs, clicks, rubs,  or gallops. Abdomen:  Soft, nontender and nondistended. Normal bowel sounds.   Neuro/Psych:  Alert and cooperative. Normal mood and affect. A and O x 3   '@Noal Abshier'$  Simonne Maffucci, MD, [facility] [phone] (pager) 09/04/2020 11:25 AM@

## 2020-09-04 NOTE — Patient Instructions (Addendum)
No polyps today.  Sorry about the IV problem.  No need to repeat a routine colonoscopy.  I appreciate the opportunity to care for you. Gatha Mayer, MD, [facility]  Resume previous diet and medications. No repeat Colonoscopy due to age.  YOU HAD AN ENDOSCOPIC PROCEDURE TODAY AT [facility]:   Refer to the procedure report that was given to you for any specific questions about what was found during the examination.  If the procedure report does not answer your questions, please call your gastroenterologist to clarify.  If you requested that your care partner not be given the details of your procedure findings, then the procedure report has been included in a sealed envelope for you to review at your convenience later.  YOU SHOULD EXPECT: Some feelings of bloating in the abdomen. Passage of more gas than usual.  Walking can help get rid of the air that was put into your GI tract during the procedure and reduce the bloating. If you had a lower endoscopy (such as a colonoscopy or flexible sigmoidoscopy) you may notice spotting of blood in your stool or on the toilet paper. If you underwent a bowel prep for your procedure, you may not have a normal bowel movement for a few days.  Please Note:  You might notice some irritation and congestion in your nose or some drainage.  This is from the oxygen used during your procedure.  There is no need for concern and it should clear up in a day or so.  SYMPTOMS TO REPORT IMMEDIATELY:  Following lower endoscopy (colonoscopy or flexible sigmoidoscopy):  Excessive amounts of blood in the stool  Significant tenderness or worsening of abdominal pains  Swelling of the abdomen that is new, acute  Fever of 100F or higher  For urgent or emergent issues, a gastroenterologist can be reached at any hour by calling [phone]. Do not use MyChart messaging for urgent concerns.    DIET:  We do recommend a small meal at first, but then you may  proceed to your regular diet.  Drink plenty of fluids but you should avoid alcoholic beverages for 24 hours.  ACTIVITY:  You should plan to take it easy for the rest of today and you should NOT DRIVE or use heavy machinery until tomorrow (because of the sedation medicines used during the test).    FOLLOW UP: Our staff will call the number listed on your records 48-72 hours following your procedure to check on you and address any questions or concerns that you may have regarding the information given to you following your procedure. If we do not reach you, we will leave a message.  We will attempt to reach you two times.  During this call, we will ask if you have developed any symptoms of COVID 19. If you develop any symptoms (ie: fever, flu-like symptoms, shortness of breath, cough etc.) before then, please call [phone].  If you test positive for Covid 19 in the 2 weeks post procedure, please call and report this information to [place].    If any biopsies were taken you will be contacted by phone or by letter within the next 1-3 weeks.  Please call us at [phone] if you have not heard about the biopsies in 3 weeks.    SIGNATURES/CONFIDENTIALITY: You and/or your care partner have signed paperwork which will be entered into your electronic medical record.  These signatures attest to the fact that that the information above on your After  Visit Summary has been reviewed and is understood.  Full responsibility of the confidentiality of this discharge information lies with you and/or your care-partner.

## 2020-09-06 ENCOUNTER — Telehealth: Payer: Self-pay

## 2020-09-06 ENCOUNTER — Telehealth: Payer: Self-pay | Admitting: *Deleted

## 2020-09-06 NOTE — Telephone Encounter (Signed)
  Follow up Call-  Call back number 09/04/2020  Post procedure Call Back phone  # [phone] cell  Permission to leave phone message Yes  Some recent data might be hidden    LMOM to call back with any questions or concerns.  Also, call back if patient has developed fever, respiratory issues or been dx with COVID or had any family members or close contacts diagnosed since her procedure.

## 2020-09-06 NOTE — Telephone Encounter (Signed)
  Follow up Call-  Call back number 09/04/2020  Post procedure Call Back phone  # [phone] cell  Permission to leave phone message Yes  Some recent data might be hidden    1st follow up call made.  NALM

## 2020-12-26 ENCOUNTER — Other Ambulatory Visit: Payer: Self-pay | Admitting: Internal Medicine

## 2020-12-26 DIAGNOSIS — Z1231 Encounter for screening mammogram for malignant neoplasm of breast: Secondary | ICD-10-CM

## 2021-01-27 DIAGNOSIS — M1990 Unspecified osteoarthritis, unspecified site: Secondary | ICD-10-CM

## 2021-01-27 HISTORY — DX: Unspecified osteoarthritis, unspecified site: M19.90

## 2021-02-04 ENCOUNTER — Ambulatory Visit
Admission: RE | Admit: 2021-02-04 | Discharge: 2021-02-04 | Disposition: A | Discharge status: Home / Self Care | Payer: Medicare Other | Source: Ambulatory Visit | Attending: Internal Medicine | Admitting: Internal Medicine

## 2021-02-04 DIAGNOSIS — Z1231 Encounter for screening mammogram for malignant neoplasm of breast: Secondary | ICD-10-CM

## 2021-02-06 ENCOUNTER — Other Ambulatory Visit: Payer: Self-pay | Admitting: Internal Medicine

## 2021-02-06 DIAGNOSIS — R928 Other abnormal and inconclusive findings on diagnostic imaging of breast: Secondary | ICD-10-CM

## 2021-02-27 DIAGNOSIS — C50811 Malignant neoplasm of overlapping sites of right female breast: Secondary | ICD-10-CM

## 2021-02-27 DIAGNOSIS — Z170 Estrogen receptor positive status [ER+]: Secondary | ICD-10-CM

## 2021-02-27 HISTORY — DX: Estrogen receptor positive status (ER+): Z17.0

## 2021-02-27 HISTORY — DX: Malignant neoplasm of overlapping sites of right female breast: C50.811

## 2021-03-05 ENCOUNTER — Ambulatory Visit
Admission: RE | Admit: 2021-03-05 | Discharge: 2021-03-05 | Disposition: A | Discharge status: Home / Self Care | Payer: Medicare Other | Source: Ambulatory Visit | Attending: Internal Medicine | Admitting: Internal Medicine

## 2021-03-05 ENCOUNTER — Other Ambulatory Visit: Payer: Self-pay | Admitting: Internal Medicine

## 2021-03-05 DIAGNOSIS — C50811 Malignant neoplasm of overlapping sites of right female breast: Secondary | ICD-10-CM | POA: Insufficient documentation

## 2021-03-05 DIAGNOSIS — R928 Other abnormal and inconclusive findings on diagnostic imaging of breast: Secondary | ICD-10-CM

## 2021-03-05 DIAGNOSIS — N6310 Unspecified lump in the right breast, unspecified quadrant: Secondary | ICD-10-CM

## 2021-03-12 ENCOUNTER — Other Ambulatory Visit: Payer: Medicare Other

## 2021-03-15 ENCOUNTER — Other Ambulatory Visit: Payer: Medicare Other

## 2021-03-15 ENCOUNTER — Ambulatory Visit
Admission: RE | Admit: 2021-03-15 | Discharge: 2021-03-15 | Disposition: A | Discharge status: Home / Self Care | Payer: Medicare Other | Source: Ambulatory Visit | Attending: Internal Medicine | Admitting: Internal Medicine

## 2021-03-15 ENCOUNTER — Ambulatory Visit: Payer: Medicare Other

## 2021-03-15 ENCOUNTER — Other Ambulatory Visit: Payer: Self-pay | Admitting: Internal Medicine

## 2021-03-15 DIAGNOSIS — N6310 Unspecified lump in the right breast, unspecified quadrant: Secondary | ICD-10-CM

## 2021-03-15 HISTORY — PX: BREAST BIOPSY: SHX20

## 2021-03-19 ENCOUNTER — Telehealth: Payer: Self-pay | Admitting: Hematology

## 2021-03-19 NOTE — Telephone Encounter (Signed)
Spoke to patient to confirm afternoon clinic appointment for 3/1, packet will be mailed

## 2021-03-21 ENCOUNTER — Encounter: Payer: Self-pay | Admitting: *Deleted

## 2021-03-21 DIAGNOSIS — D0511 Intraductal carcinoma in situ of right breast: Secondary | ICD-10-CM

## 2021-03-25 NOTE — Progress Notes (Signed)
Radiation Oncology         [phone] ________________________________  Name: Jacqueline Ortiz        MRN: [id number]  Date of Service: 03/27/2021 DOB: 11-12-1945  [id number], Delfino Lovett, MD  Erroll Luna, MD     REFERRING PHYSICIAN: Erroll Luna, MD   DIAGNOSIS: The encounter diagnosis was Ductal carcinoma in situ (DCIS) of right breast.   HISTORY OF PRESENT ILLNESS: Jacqueline Ortiz is a 76 y.o. female seen in the multidisciplinary breast clinic for a new diagnosis of right breast cancer. The patient was noted to have screening detected bilateral breast findings.  There were findings concerning for mass in the left breast which further diagnostic work-up showed to be benign-appearing cysts, however the findings on the right side were consistent with 3 separate masses.  By ultrasound at 12:00 there was a 1.5 cm mass, at 3:00 there was a 5 mm mass, and 930 o'clock there was a 5 mm mass.  Her axilla was negative for adenopathy.  She underwent biopsies on 03/15/2021, the 12 o'clock position revealed intermediate grade DCIS negative for any invasive carcinoma within a complex sclerosing lesion with microcalcifications epithelial hyperplasia without atypia was also seen in addition to fibrocystic change, this was ER/PR positive.  The 3:00 biopsy showed radial scar/complex sclerosing lesion negative for microcalcifications.  The third specimen at 24 showed high-grade DCIS solid type without necrosis involving a fibroadenoma negative for invasive disease or calcifications this was also ER/PR positive.  She is seen today to discuss treatment recommendations of her cancer.    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 Silvano Rusk       PAST SURGICAL HISTORY: Past Surgical History:  Procedure Laterality Date   BREAST BIOPSY Right 03/15/2021   CESAREAN SECTION     COLONOSCOPY  multiple   Dr. Silvano Rusk   DILATION AND CURETTAGE OF UTERUS     ESOPHAGOGASTRODUODENOSCOPY  02/02/2003   Dr. Silvano Rusk   ESOPHAGOGASTRODUODENOSCOPY N/A 10/21/2015   Procedure: ESOPHAGOGASTRODUODENOSCOPY (EGD);  Surgeon: Milus Banister, MD;  Location: [facility];  Service: Endoscopy;  Laterality: N/A;   KNEE SURGERY Right    right fx- retained hardware   TONSILLECTOMY     TUBAL LIGATION     UPPER GASTROINTESTINAL ENDOSCOPY     vocal cord surgery     "did improve condition"     FAMILY HISTORY:  Family History  Problem Relation Age of Onset   Heart disease Mother    Diabetes Mother    Heart disease Father    Colon cancer Maternal  Grandmother    Colon polyps Brother    Cancer Other        gastric   Heart disease Sister    Esophageal cancer Neg Hx    Rectal cancer Neg Hx    Stomach cancer Neg Hx    Breast cancer Neg Hx      SOCIAL HISTORY:  reports that she has never smoked. She has never used smokeless tobacco. She reports that she does not drink alcohol and does not use drugs. The patient is married and lives in [place]. She is retired from working as an Web designer. She enjoys reading [place] fiction books.    ALLERGIES: Patient has no known allergies.   MEDICATIONS:  Current Outpatient Medications  Medication Sig Dispense  Refill   atorvastatin (LIPITOR) 20 MG tablet Take 20 mg by mouth daily.      Calcium-Magnesium-Vitamin D [id number] MG-MG-UNIT CHEW Chew 2 capsules by mouth 2 (two) times daily.     escitalopram (LEXAPRO) 10 MG tablet Take 10 mg by mouth daily.     ibuprofen (ADVIL,MOTRIN) 200 MG tablet Take 200 mg by mouth daily. Pt is using Advil liquid until past procedure.     Ketotifen Fumarate (REFRESH EYE ITCH RELIEF OP) Place into both eyes as needed.     pantoprazole (PROTONIX) 40 MG tablet Take 40 mg by mouth 2 (two) times daily.     pseudoephedrine (SUDAFED) 30 MG tablet Take 30 mg by mouth 2 (two) times daily. (Patient not taking: No sig reported)     triamterene-hydrochlorothiazide (MAXZIDE-25) 37.5-25 MG tablet Take 1 tablet by mouth daily.     No current facility-administered medications for this visit.     REVIEW OF SYSTEMS: On review of systems, the patient reports that she is doing well overall. She has some arthritis especially back pain at times. No specific breast complaints are noted.      PHYSICAL EXAM:  Wt Readings from Last 3 Encounters:  09/04/20 127 lb (57.6 kg)  08/21/20 127 lb (57.6 kg)  02/06/16 118 lb (53.5 kg)   Temp Readings from Last 3 Encounters:  09/04/20 (!) 96.8 F (36 C)  02/08/16 98.5 F (36.9 C) (Oral)  01/31/16 98 F (36.7 C) (Oral)   BP Readings from Last 3 Encounters:  09/04/20 128/77  02/08/16 (!) 142/83  01/31/16 135/76   Pulse Readings from Last 3 Encounters:  09/04/20 76  02/08/16 (!) 109  01/31/16 88    In general this is a well appearing caucasian female in no acute distress. She's alert and oriented x4 and appropriate throughout the examination. Cardiopulmonary assessment is negative for acute distress and she exhibits normal effort. Bilateral breast exam is deferred.    ECOG = 1  0 - Asymptomatic (Fully active, able to carry on all predisease activities without restriction)  1 - Symptomatic but completely ambulatory (Restricted in  physically strenuous activity but ambulatory and able to carry out work of a light or sedentary nature. For example, light housework, office work)  2 - Symptomatic, <50% in bed during the day (Ambulatory and capable of all self care but unable to carry out any work activities. Up and about more than 50% of waking hours)  3 - Symptomatic, >50% in bed, but not bedbound (Capable of only limited self-care, confined to bed or chair 50% or more of waking hours)  4 - Bedbound (Completely disabled. Cannot carry on any self-care. Totally confined to bed or chair)  5 - Death   Eustace Pen MM,  Creech RH, Tormey DC, et al. [phone]). "Toxicity and response criteria of the [facility]". [place] Oncol. 5 (6): 649-55    LABORATORY DATA:  Lab Results  Component Value Date   WBC 5.5 01/31/2016   HGB 13.6 01/31/2016   HCT 41.5 01/31/2016   MCV 88.5 01/31/2016   PLT 221 01/31/2016   Lab Results  Component Value Date   NA 137 02/07/2016   K 3.0 (L) 02/07/2016   CL 106 02/07/2016   CO2 26 02/07/2016   Lab Results  Component Value Date   ALT 30 10/21/2015   AST 30 10/21/2015   ALKPHOS 79 10/21/2015   BILITOT 0.6 10/21/2015      RADIOGRAPHY: US BREAST LTD UNI LEFT INC AXILLA  Result Date: 03/05/2021 CLINICAL DATA:  Bilateral screening recall for a right breast distortion and possible masses and a possible left breast mass. EXAM: DIGITAL DIAGNOSTIC BILATERAL MAMMOGRAM WITH TOMOSYNTHESIS AND CAD; ULTRASOUND LEFT BREAST LIMITED; ULTRASOUND RIGHT BREAST LIMITED TECHNIQUE: Bilateral digital diagnostic mammography and breast tomosynthesis was performed. The images were evaluated with computer-aided detection.; Targeted ultrasound examination of the left breast was performed.; Targeted ultrasound examination of the right breast was performed COMPARISON:  Previous exam(s). ACR Breast Density Category c: The breast tissue is heterogeneously dense, which may obscure small masses. FINDINGS:  Diagnostic images of the right breast demonstrate a persistent prominent area of distortion in the superior anterior right breast. In the lateral middle depth of the right breast there is a 5-6 mm oval circumscribed mass. In the medial right breast, middle to posterior depth there is a lobulated mass measuring approximately 8 mm. There is a possible asymmetry at the same axis but closer to the nipple. Diagnostic images of the left breast demonstrates a persistent oval circumscribed mass at 5-6 o'clock. Images targeted to the right breast at 12 o'clock, 1 cm from the nipple demonstrates an irregular hypoechoic mass measuring at least 1.5 x 0.6 x 1.1 cm. However, cine clips throughout the area demonstrates what appears to be abnormal tissue insinuating through a broader span at than just 1.5 cm, but there are no clearly defined borders. Ultrasound of the right breast at 3 o'clock, 5 cm from the nipple demonstrates a hypoechoic oval mass with indistinct margins measuring 5 mm. A benign-appearing cyst is seen in the retroareolar right breast at 4 o'clock measuring 5 mm. In the retroareolar right breast at 9 o'clock, there is a mildly complicated cyst measuring 7 mm. In the right breast at 9:30, 6 cm from the nipple there is a hypoechoic oval shadowing mass with indistinct margins measuring 5 x 4 x 4 mm. Ultrasound of the right axilla demonstrates multiple normal-appearing lymph nodes. Ultrasound of the left breast at 6 o'clock, 2 cm from the nipple demonstrates an anechoic oval circumscribed mass measuring 9 x 5 x 8 mm. The no abnormal lymph nodes are found in the left axilla. IMPRESSION: 1. There is a prominent distortion in the superior retroareolar right breast with a sonographic correlate at 12 o'clock. Cine images through this region demonstrates that the actual area of abnormality may be broader than the 1.5 cm ultrasound measurement. 2. There is an indeterminate 5 mm mass in the right breast at 3 o'clock. 3.   There is an indeterminate 5 mm mass in the right breast at 9:30. 4.  No evidence of bilateral axillary lymphadenopathy. 5.  The mass in the left breast corresponds with a benign cyst. RECOMMENDATION: 1. Ultrasound guided biopsy  is recommended for the site of distortion in the right breast at 12 o'clock, and the small indeterminate masses at 3 o'clock and at 9:30 in the right breast. The procedures have been scheduled for 03/15/2021 at 8:30 a.m. 2. If malignancy is found on biopsy, bilateral breast MRI is recommended for evaluation of extent of disease given the multiplicity of findings an ill-defined appearance of the suspicious distortion. I have discussed the findings and recommendations with the patient. If applicable, a reminder letter will be sent to the patient regarding the next appointment. BI-RADS CATEGORY  5: Highly suggestive of malignancy. Electronically Signed   By: Ammie Ferrier M.D.   On: 03/05/2021 15:11  US BREAST LTD UNI RIGHT INC AXILLA  Result Date: 03/05/2021 CLINICAL DATA:  Bilateral screening recall for a right breast distortion and possible masses and a possible left breast mass. EXAM: DIGITAL DIAGNOSTIC BILATERAL MAMMOGRAM WITH TOMOSYNTHESIS AND CAD; ULTRASOUND LEFT BREAST LIMITED; ULTRASOUND RIGHT BREAST LIMITED TECHNIQUE: Bilateral digital diagnostic mammography and breast tomosynthesis was performed. The images were evaluated with computer-aided detection.; Targeted ultrasound examination of the left breast was performed.; Targeted ultrasound examination of the right breast was performed COMPARISON:  Previous exam(s). ACR Breast Density Category c: The breast tissue is heterogeneously dense, which may obscure small masses. FINDINGS: Diagnostic images of the right breast demonstrate a persistent prominent area of distortion in the superior anterior right breast. In the lateral middle depth of the right breast there is a 5-6 mm oval circumscribed mass. In the medial right breast,  middle to posterior depth there is a lobulated mass measuring approximately 8 mm. There is a possible asymmetry at the same axis but closer to the nipple. Diagnostic images of the left breast demonstrates a persistent oval circumscribed mass at 5-6 o'clock. Images targeted to the right breast at 12 o'clock, 1 cm from the nipple demonstrates an irregular hypoechoic mass measuring at least 1.5 x 0.6 x 1.1 cm. However, cine clips throughout the area demonstrates what appears to be abnormal tissue insinuating through a broader span at than just 1.5 cm, but there are no clearly defined borders. Ultrasound of the right breast at 3 o'clock, 5 cm from the nipple demonstrates a hypoechoic oval mass with indistinct margins measuring 5 mm. A benign-appearing cyst is seen in the retroareolar right breast at 4 o'clock measuring 5 mm. In the retroareolar right breast at 9 o'clock, there is a mildly complicated cyst measuring 7 mm. In the right breast at 9:30, 6 cm from the nipple there is a hypoechoic oval shadowing mass with indistinct margins measuring 5 x 4 x 4 mm. Ultrasound of the right axilla demonstrates multiple normal-appearing lymph nodes. Ultrasound of the left breast at 6 o'clock, 2 cm from the nipple demonstrates an anechoic oval circumscribed mass measuring 9 x 5 x 8 mm. The no abnormal lymph nodes are found in the left axilla. IMPRESSION: 1. There is a prominent distortion in the superior retroareolar right breast with a sonographic correlate at 12 o'clock. Cine images through this region demonstrates that the actual area of abnormality may be broader than the 1.5 cm ultrasound measurement. 2. There is an indeterminate 5 mm mass in the right breast at 3 o'clock. 3.  There is an indeterminate 5 mm mass in the right breast at 9:30. 4.  No evidence of bilateral axillary lymphadenopathy. 5.  The mass in the left breast corresponds with a benign cyst. RECOMMENDATION: 1. Ultrasound guided biopsy is recommended for the  site of distortion  in the right breast at 12 o'clock, and the small indeterminate masses at 3 o'clock and at 9:30 in the right breast. The procedures have been scheduled for 03/15/2021 at 8:30 a.m. 2. If malignancy is found on biopsy, bilateral breast MRI is recommended for evaluation of extent of disease given the multiplicity of findings an ill-defined appearance of the suspicious distortion. I have discussed the findings and recommendations with the patient. If applicable, a reminder letter will be sent to the patient regarding the next appointment. BI-RADS CATEGORY  5: Highly suggestive of malignancy. Electronically Signed   By: Ammie Ferrier M.D.   On: 03/05/2021 15:11  MM DIAG BREAST TOMO BILATERAL  Result Date: 03/05/2021 CLINICAL DATA:  Bilateral screening recall for a right breast distortion and possible masses and a possible left breast mass. EXAM: DIGITAL DIAGNOSTIC BILATERAL MAMMOGRAM WITH TOMOSYNTHESIS AND CAD; ULTRASOUND LEFT BREAST LIMITED; ULTRASOUND RIGHT BREAST LIMITED TECHNIQUE: Bilateral digital diagnostic mammography and breast tomosynthesis was performed. The images were evaluated with computer-aided detection.; Targeted ultrasound examination of the left breast was performed.; Targeted ultrasound examination of the right breast was performed COMPARISON:  Previous exam(s). ACR Breast Density Category c: The breast tissue is heterogeneously dense, which may obscure small masses. FINDINGS: Diagnostic images of the right breast demonstrate a persistent prominent area of distortion in the superior anterior right breast. In the lateral middle depth of the right breast there is a 5-6 mm oval circumscribed mass. In the medial right breast, middle to posterior depth there is a lobulated mass measuring approximately 8 mm. There is a possible asymmetry at the same axis but closer to the nipple. Diagnostic images of the left breast demonstrates a persistent oval circumscribed mass at 5-6 o'clock.  Images targeted to the right breast at 12 o'clock, 1 cm from the nipple demonstrates an irregular hypoechoic mass measuring at least 1.5 x 0.6 x 1.1 cm. However, cine clips throughout the area demonstrates what appears to be abnormal tissue insinuating through a broader span at than just 1.5 cm, but there are no clearly defined borders. Ultrasound of the right breast at 3 o'clock, 5 cm from the nipple demonstrates a hypoechoic oval mass with indistinct margins measuring 5 mm. A benign-appearing cyst is seen in the retroareolar right breast at 4 o'clock measuring 5 mm. In the retroareolar right breast at 9 o'clock, there is a mildly complicated cyst measuring 7 mm. In the right breast at 9:30, 6 cm from the nipple there is a hypoechoic oval shadowing mass with indistinct margins measuring 5 x 4 x 4 mm. Ultrasound of the right axilla demonstrates multiple normal-appearing lymph nodes. Ultrasound of the left breast at 6 o'clock, 2 cm from the nipple demonstrates an anechoic oval circumscribed mass measuring 9 x 5 x 8 mm. The no abnormal lymph nodes are found in the left axilla. IMPRESSION: 1. There is a prominent distortion in the superior retroareolar right breast with a sonographic correlate at 12 o'clock. Cine images through this region demonstrates that the actual area of abnormality may be broader than the 1.5 cm ultrasound measurement. 2. There is an indeterminate 5 mm mass in the right breast at 3 o'clock. 3.  There is an indeterminate 5 mm mass in the right breast at 9:30. 4.  No evidence of bilateral axillary lymphadenopathy. 5.  The mass in the left breast corresponds with a benign cyst. RECOMMENDATION: 1. Ultrasound guided biopsy is recommended for the site of distortion in the right breast at 12 o'clock, and the small  indeterminate masses at 3 o'clock and at 9:30 in the right breast. The procedures have been scheduled for 03/15/2021 at 8:30 a.m. 2. If malignancy is found on biopsy, bilateral breast MRI is  recommended for evaluation of extent of disease given the multiplicity of findings an ill-defined appearance of the suspicious distortion. I have discussed the findings and recommendations with the patient. If applicable, a reminder letter will be sent to the patient regarding the next appointment. BI-RADS CATEGORY  5: Highly suggestive of malignancy. Electronically Signed   By: Ammie Ferrier M.D.   On: 03/05/2021 15:11  MM CLIP PLACEMENT RIGHT  Result Date: 03/15/2021 CLINICAL DATA:  Status post 3 site ultrasound-guided biopsies today. EXAM: 3D DIAGNOSTIC RIGHT MAMMOGRAM POST ULTRASOUND BIOPSY x3 COMPARISON:  Previous exam(s). FINDINGS: 3D Mammographic images were obtained following ultrasound-guided biopsy of distortion within the upper retroareolar RIGHT breast, 12 o'clock axis, 1 cm from the nipple and ultrasound-guided biopsy of a RIGHT breast mass at the 3 o'clock axis and ultrasound-guided biopsy of a RIGHT breast mass at the 9:30 o'clock axis. The biopsy marking clips are in expected position at the sites of biopsy. IMPRESSION: 1. Appropriate positioning of the ribbon shaped biopsy marking clip at the site of biopsy in the upper retroareolar RIGHT breast corresponding to the targeted distortion at the 12 o'clock axis. 2. Appropriate positioning of the coil shaped biopsy marking clip at the site of biopsy in the inner RIGHT breast corresponding to the targeted mass at the 3 o'clock axis. 3. Appropriate positioning of the heart shaped biopsy marking clip at the site of biopsy in the upper-outer quadrant of the RIGHT breast, at posterior depth, corresponding to the targeted mass at the 9:30 o'clock axis. Final Assessment: Post Procedure Mammograms for Marker Placement Electronically Signed   By: Franki Cabot M.D.   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 This was found to be concordant by Dr. Franki Cabot. 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 This was found to be discordant by Dr. Franki Cabot, with excision recommended. 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 This was found to be concordant by Dr. Franki Cabot. Pathology results were discussed with the patient by telephone. The patient reported doing well after the biopsies with tenderness and bruising at the sites. Post biopsy instructions and care were reviewed and questions were answered. The patient was encouraged to call [facility] for any additional concerns. My direct phone number was provided. The patient was referred to [facility] on March 27, 2021. Bilateral breast MRI is recommended for evaluation of extent of disease given the high grade histology, multiplicity of findings and ill-defined extent of the RIGHT breast distortion. Pathology results reported by Terie Purser, RN on 03/18/2021. Electronically Signed   By: Franki Cabot M.D.   On: 03/18/2021 15:52   Result Date: 03/18/2021 CLINICAL DATA:  Patient with prominent distortion in the superior retroareolar RIGHT breast with sonographic correlate at  12 o'clock axis. Patient presents today for ultrasound-guided biopsy. Patient with additional an indeterminate 5 mm mass in the RIGHT breast at the 3 o'clock axis. Patient presents today for ultrasound-guided biopsy. Patient with additional  indeterminate 5 mm mass in the RIGHT breast at the 9:30 o'clock axis. Patient presents today for ultrasound-guided biopsy. EXAM: ULTRASOUND GUIDED RIGHT BREAST CORE NEEDLE BIOPSY x3 COMPARISON:  Previous exam(s). PROCEDURE: I met with the patient and we discussed the procedure of ultrasound-guided biopsy, including benefits and alternatives. We discussed the high likelihood of a successful procedure. We discussed the risks of the procedure, including infection, bleeding, tissue injury, clip migration, and inadequate sampling. Informed written consent was given. The usual time-out protocol was performed immediately prior to the procedure. Site 1: Lesion quadrant: 12 o'clock Using sterile technique and 1% Lidocaine as local anesthetic, under direct ultrasound visualization, a 12 gauge spring-loaded device was used to perform biopsy of the distortion within the RIGHT breast at the 12 o'clock axis, 1 cm from the nipple, using a medial approach. At the conclusion of the procedure a ribbon shaped tissue marker clip was deployed into the biopsy cavity. Site 2: Lesion quadrant: 3 o'clock Using sterile technique and 1% Lidocaine as local anesthetic, under direct ultrasound visualization, a 12 gauge spring-loaded device was used to perform biopsy of the RIGHT breast mass at the 3 o'clock axis using a medial approach. At the conclusion of the procedure a coil shaped tissue marker clip was deployed into the biopsy cavity. Site 3: Lesion quadrant: Upper outer quadrant Using sterile technique and 1% Lidocaine as local anesthetic, under direct ultrasound visualization, a 12 gauge spring-loaded device was used to perform biopsy of the RIGHT breast mass at the 9:30 o'clock axis using a inferolateral approach. At the conclusion of the procedure a heart shaped tissue marker clip was deployed into the biopsy cavity. Follow up 2 view mammogram was performed and dictated separately. IMPRESSION: 1. Ultrasound guided biopsy of the RIGHT  breast distortion, 12 o'clock axis, 1 cm from the nipple. Ribbon shaped clip placed at the biopsy site. No apparent complications. 2. Ultrasound guided biopsy of the mass in the RIGHT breast at the 3 o'clock axis. Coil shaped clip placed at the biopsy site. Mild postprocedural bleeding. Manual compression applied and hemostasis obtained. No apparent complications. 3. Ultrasound guided biopsy of the mass in the RIGHT breast at the 9:30 o'clock axis. Heart shaped clip placed at the biopsy site. No apparent complications. Electronically Signed: By: Franki Cabot M.D. 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 This was found to be concordant by Dr. Franki Cabot. 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 This was found to be discordant by Dr. Franki Cabot, with excision recommended. 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 This was found to be concordant by Dr. Franki Cabot. Pathology results were discussed with the patient by telephone. The patient reported doing well after the biopsies with tenderness and bruising at the sites. Post biopsy instructions and care were reviewed and questions were  answered. The patient was encouraged to call [facility] for any additional concerns. My direct phone number was provided. The patient was referred to [facility] at  [facility] on March 27, 2021. Bilateral breast MRI is recommended for evaluation of extent of disease given the high grade histology, multiplicity of findings and ill-defined extent of the RIGHT breast distortion. Pathology results reported by Terie Purser, RN on 03/18/2021. Electronically Signed   By: Franki Cabot M.D.   On: 03/18/2021 15:52   Result Date: 03/18/2021 CLINICAL DATA:  Patient with prominent distortion in the superior retroareolar RIGHT breast with sonographic correlate at 12 o'clock axis. Patient presents today for ultrasound-guided biopsy. Patient with additional an indeterminate 5 mm mass in the RIGHT breast at the 3 o'clock axis. Patient presents today for ultrasound-guided biopsy. Patient with additional indeterminate 5 mm mass in the RIGHT breast at the 9:30 o'clock axis. Patient presents today for ultrasound-guided biopsy. EXAM: ULTRASOUND GUIDED RIGHT BREAST CORE NEEDLE BIOPSY x3 COMPARISON:  Previous exam(s). PROCEDURE: I met with the patient and we discussed the procedure of ultrasound-guided biopsy, including benefits and alternatives. We discussed the high likelihood of a successful procedure. We discussed the risks of the procedure, including infection, bleeding, tissue injury, clip migration, and inadequate sampling. Informed written consent was given. The usual time-out protocol was performed immediately prior to the procedure. Site 1: Lesion quadrant: 12 o'clock Using sterile technique and 1% Lidocaine as local anesthetic, under direct ultrasound visualization, a 12 gauge spring-loaded device was used to perform biopsy of the distortion within the RIGHT breast at the 12 o'clock axis, 1 cm from the nipple, using a medial approach. At the conclusion of the procedure a ribbon shaped tissue marker clip was deployed into the biopsy cavity. Site 2: Lesion quadrant: 3 o'clock Using sterile technique and 1% Lidocaine as local anesthetic, under direct ultrasound  visualization, a 12 gauge spring-loaded device was used to perform biopsy of the RIGHT breast mass at the 3 o'clock axis using a medial approach. At the conclusion of the procedure a coil shaped tissue marker clip was deployed into the biopsy cavity. Site 3: Lesion quadrant: Upper outer quadrant Using sterile technique and 1% Lidocaine as local anesthetic, under direct ultrasound visualization, a 12 gauge spring-loaded device was used to perform biopsy of the RIGHT breast mass at the 9:30 o'clock axis using a inferolateral approach. At the conclusion of the procedure a heart shaped tissue marker clip was deployed into the biopsy cavity. Follow up 2 view mammogram was performed and dictated separately. IMPRESSION: 1. Ultrasound guided biopsy of the RIGHT breast distortion, 12 o'clock axis, 1 cm from the nipple. Ribbon shaped clip placed at the biopsy site. No apparent complications. 2. Ultrasound guided biopsy of the mass in the RIGHT breast at the 3 o'clock axis. Coil shaped clip placed at the biopsy site. Mild postprocedural bleeding. Manual compression applied and hemostasis obtained. No apparent complications. 3. Ultrasound guided biopsy of the mass in the RIGHT breast at the 9:30 o'clock axis. Heart shaped clip placed at the biopsy site. No apparent complications. Electronically Signed: By: Franki Cabot M.D. On: 03/15/2021 09:56  [place] RT BREAST BX W LOC DEV EA ADD LESION IMG BX SPEC US GUIDE  Addendum Date: 03/18/2021   ADDENDUM REPORT: 03/18/2021 15:52 ADDENDUM: Pathology revealed DUCTAL CARCINOMA IN SITU, INTERMEDIATE NUCLEAR GRADE, SOLID TYPE WITHOUT NECROSIS, COMPLEX SCLEROSING LESION WITH MICROCALCIFICATIONS, PROLIFERATIVE FIBROCYSTIC CHANGES INCLUDING EPITHELIAL HYPERPLASIA WITHOUT  ATYPIA of the RIGHT breast, 12 o'clock, 1cmfn, (ribbon clip). This was found to be concordant by Dr. Franki Cabot. 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  This was found to be discordant by Dr. Franki Cabot, with excision recommended. 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 This was found to be concordant by Dr. Franki Cabot. Pathology results were discussed with the patient by telephone. The patient reported doing well after the biopsies with tenderness and bruising at the sites. Post biopsy instructions and care were reviewed and questions were answered. The patient was encouraged to call [facility] for any additional concerns. My direct phone number was provided. The patient was referred to [facility] on March 27, 2021. Bilateral breast MRI is recommended for evaluation of extent of disease given the high grade histology, multiplicity of findings and ill-defined extent of the RIGHT breast distortion. Pathology results reported by Terie Purser, RN on 03/18/2021. Electronically Signed   By: Franki Cabot M.D.   On: 03/18/2021 15:52   Result Date: 03/18/2021 CLINICAL DATA:  Patient with prominent distortion in the superior retroareolar RIGHT breast with sonographic correlate at 12 o'clock axis. Patient presents today for ultrasound-guided biopsy. Patient with additional an indeterminate 5 mm mass in the RIGHT breast at the 3 o'clock axis. Patient presents today for ultrasound-guided biopsy. Patient with additional indeterminate 5 mm mass in the RIGHT breast at the 9:30 o'clock axis. Patient presents today for ultrasound-guided biopsy. EXAM: ULTRASOUND GUIDED RIGHT BREAST CORE NEEDLE BIOPSY x3 COMPARISON:  Previous exam(s). PROCEDURE: I met with the patient and we discussed the procedure of ultrasound-guided biopsy, including benefits and alternatives. We discussed the high likelihood of a successful procedure. We discussed the risks  of the procedure, including infection, bleeding, tissue injury, clip migration, and inadequate sampling. Informed written consent was given. The usual time-out protocol was performed immediately prior to the procedure. Site 1: Lesion quadrant: 12 o'clock Using sterile technique and 1% Lidocaine as local anesthetic, under direct ultrasound visualization, a 12 gauge spring-loaded device was used to perform biopsy of the distortion within the RIGHT breast at the 12 o'clock axis, 1 cm from the nipple, using a medial approach. At the conclusion of the procedure a ribbon shaped tissue marker clip was deployed into the biopsy cavity. Site 2: Lesion quadrant: 3 o'clock Using sterile technique and 1% Lidocaine as local anesthetic, under direct ultrasound visualization, a 12 gauge spring-loaded device was used to perform biopsy of the RIGHT breast mass at the 3 o'clock axis using a medial approach. At the conclusion of the procedure a coil shaped tissue marker clip was deployed into the biopsy cavity. Site 3: Lesion quadrant: Upper outer quadrant Using sterile technique and 1% Lidocaine as local anesthetic, under direct ultrasound visualization, a 12 gauge spring-loaded device was used to perform biopsy of the RIGHT breast mass at the 9:30 o'clock axis using a inferolateral approach. At the conclusion of the procedure a heart shaped tissue marker clip was deployed into the biopsy cavity. Follow up 2 view mammogram was performed and dictated separately. IMPRESSION: 1. Ultrasound guided biopsy of the RIGHT breast distortion, 12 o'clock axis, 1 cm from the nipple. Ribbon shaped clip placed at the biopsy site. No apparent complications. 2. Ultrasound guided biopsy of the mass in the  RIGHT breast at the 3 o'clock axis. Coil shaped clip placed at the biopsy site. Mild postprocedural bleeding. Manual compression applied and hemostasis obtained. No apparent complications. 3. Ultrasound guided biopsy of the mass in the RIGHT breast at  the 9:30 o'clock axis. Heart shaped clip placed at the biopsy site. No apparent complications. Electronically Signed: By: Franki Cabot M.D. On: 03/15/2021 09:56      IMPRESSION/PLAN: 1. 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 Dr. Lisbeth Renshaw discusses the pathology findings and reviews the nature of early stage breast disease. The consensus from the breast conference includes consideration of an MRI if the patient desires breast conservation with lumpectomy.  If she did undergo breast conservation, consideration for adjuvant external radiotherapy to the breast would be made to reduce risks of local recurrence followed by antiestrogen therapy. We discussed the risks, benefits, short, and long term effects of radiotherapy, as well as the curative intent, and the patient is interested in proceeding. Dr. Lisbeth Renshaw discusses the delivery and logistics of radiotherapy and anticipates a course of 4 weeks of radiotherapy to the right breast. We will see her back a few weeks after surgery to discuss the simulation process and anticipate we starting radiotherapy about 4-6 weeks after surgery.  2. Possible genetic predisposition to malignancy. The patient is a candidate for genetic testing given her personal and family history. She will meet with genetic counseling today during clinic  In a visit lasting 60 minutes, greater than 50% of the time was spent face to face reviewing her case, as well as in preparation of, discussing, and coordinating the patient's care.  The above documentation reflects my direct findings during this shared patient visit. Please see the separate note by Dr. Lisbeth Renshaw on this date for the remainder of the patient's plan of care.    Carola Rhine, [facility]    **Disclaimer: This note was dictated with voice recognition software. Similar sounding words can inadvertently be transcribed and this note may contain transcription errors  which may not have been corrected upon publication of note.**

## 2021-03-27 ENCOUNTER — Ambulatory Visit: Payer: Self-pay | Admitting: Surgery

## 2021-03-27 ENCOUNTER — Ambulatory Visit: Payer: Medicare Other | Admitting: Physical Therapy

## 2021-03-27 ENCOUNTER — Ambulatory Visit (HOSPITAL_BASED_OUTPATIENT_CLINIC_OR_DEPARTMENT_OTHER): Payer: Medicare Other | Admitting: Genetic Counselor

## 2021-03-27 ENCOUNTER — Encounter: Payer: Self-pay | Admitting: *Deleted

## 2021-03-27 ENCOUNTER — Ambulatory Visit
Admission: RE | Admit: 2021-03-27 | Discharge: 2021-03-27 | Disposition: A | Discharge status: Home / Self Care | Payer: Medicare Other | Source: Ambulatory Visit | Attending: Radiation Oncology | Admitting: Radiation Oncology

## 2021-03-27 ENCOUNTER — Inpatient Hospital Stay (HOSPITAL_BASED_OUTPATIENT_CLINIC_OR_DEPARTMENT_OTHER): Payer: Medicare Other | Admitting: Hematology

## 2021-03-27 ENCOUNTER — Other Ambulatory Visit: Payer: Self-pay

## 2021-03-27 ENCOUNTER — Other Ambulatory Visit: Payer: Self-pay | Admitting: *Deleted

## 2021-03-27 ENCOUNTER — Encounter: Payer: Self-pay | Admitting: Hematology

## 2021-03-27 ENCOUNTER — Inpatient Hospital Stay: Payer: Medicare Other | Attending: Hematology

## 2021-03-27 VITALS — BP 132/80 | HR 78 | Temp 97.7°F | Resp 18 | Ht 63.0 in | Wt 132.0 lb

## 2021-03-27 DIAGNOSIS — D0511 Intraductal carcinoma in situ of right breast: Secondary | ICD-10-CM

## 2021-03-27 DIAGNOSIS — Z808 Family history of malignant neoplasm of other organs or systems: Secondary | ICD-10-CM | POA: Diagnosis not present

## 2021-03-27 DIAGNOSIS — M810 Age-related osteoporosis without current pathological fracture: Secondary | ICD-10-CM | POA: Insufficient documentation

## 2021-03-27 DIAGNOSIS — Z840 Family history of diseases of the skin and subcutaneous tissue: Secondary | ICD-10-CM | POA: Diagnosis not present

## 2021-03-27 DIAGNOSIS — Z800 Family history of malignant neoplasm of digestive organs: Secondary | ICD-10-CM | POA: Diagnosis not present

## 2021-03-27 LAB — CMP (CANCER CENTER ONLY)
ALT: 22 U/L (ref 0–44)
AST: 24 U/L (ref 15–41)
Albumin: 4.1 g/dL (ref 3.5–5.0)
Alkaline Phosphatase: 76 U/L (ref 38–126)
Anion gap: 9 (ref 5–15)
BUN: 11 mg/dL (ref 8–23)
CO2: 30 mmol/L (ref 22–32)
Calcium: 9.9 mg/dL (ref 8.9–10.3)
Chloride: 98 mmol/L (ref 98–111)
Creatinine: 0.94 mg/dL (ref 0.44–1.00)
GFR, Estimated: >60 mL/min (ref >60)
Glucose, Bld: 192 mg/dL — ABNORMAL HIGH (ref 70–99)
Potassium: 3.2 mmol/L — ABNORMAL LOW (ref 3.5–5.1)
Sodium: 137 mmol/L (ref 135–145)
Total Bilirubin: 0.4 mg/dL (ref 0.3–1.2)
Total Protein: 7 g/dL (ref 6.5–8.1)

## 2021-03-27 LAB — CBC WITH DIFFERENTIAL (CANCER CENTER ONLY)
Abs Immature Granulocytes: 0.01 10*3/uL (ref 0.00–0.07)
Basophils Absolute: 0.1 10*3/uL (ref 0.0–0.1)
Basophils Relative: 1 %
Eosinophils Absolute: 0.1 10*3/uL (ref 0.0–0.5)
Eosinophils Relative: 2 %
HCT: 42 % (ref 36.0–46.0)
Hemoglobin: 13.6 g/dL (ref 12.0–15.0)
Immature Granulocytes: 0 %
Lymphocytes Relative: 24 %
Lymphs Abs: 1.3 10*3/uL (ref 0.7–4.0)
MCH: 29.4 pg (ref 26.0–34.0)
MCHC: 32.4 g/dL (ref 30.0–36.0)
MCV: 90.9 fL (ref 80.0–100.0)
Monocytes Absolute: 0.4 10*3/uL (ref 0.1–1.0)
Monocytes Relative: 8 %
Neutro Abs: 3.7 10*3/uL (ref 1.7–7.7)
Neutrophils Relative %: 65 %
Platelet Count: 223 10*3/uL (ref 150–400)
RBC: 4.62 MIL/uL (ref 3.87–5.11)
RDW: 14.4 % (ref 11.5–15.5)
WBC Count: 5.6 10*3/uL (ref 4.0–10.5)
nRBC: 0 % (ref 0.0–0.2)

## 2021-03-27 LAB — GENETIC SCREENING ORDER

## 2021-03-27 NOTE — Progress Notes (Addendum)
[place]   [phone] Fax:([phone] [place] Note   Patient Care Team: Burnard Bunting, MD as PCP - General (Internal Medicine) Rockwell Germany, RN as Oncology Nurse Navigator Mauro Kaufmann, RN as Oncology Nurse Navigator Erroll Luna, MD as Consulting Physician (General Surgery) Truitt Merle, MD as Consulting Physician (Hematology) Kyung Rudd, MD as Consulting Physician (Radiation Oncology)  Date of Service:  03/27/2021   CHIEF COMPLAINTS/PURPOSE OF CONSULTATION:  Right Breast DCIS, ER+  REFERRING PHYSICIAN:  [facility]   ASSESSMENT & PLAN:  Jacqueline Ortiz is a 76 y.o. postmenopausal female with   1. Right breast multifocal DCIS, grade 3, ER+/PR+ -found on screening mammogram. B/l diagnostic MM and [place] on 03/05/21 showed three suspicious areas in right breast. Biopsy on 03/15/21 confirmed DCIS to 12 o'clock and 9:30. -I discussed her breast imaging and needle biopsy results with patient and her family members in great detail. -She is a candidate for breast conservation surgery. She has been seen by breast surgeon Dr. Brantley Stage, who recommends lumpectomy. -Her DCIS will be cured by complete surgical resection. Any form of adjuvant therapy is preventive. -I reviewed her risk and treatment benefits using the Breast Cancer Nomogram from [facility]). Based on family, PMx and lifestyle she has an 11% risk of developing future breast cancer in the next 10 years. Her risk would drop to 4-5% with RT or Antiestrogen therapy alone. With both adjuvant treatments her risk would decrease to 2%.  -Given her strongly positive ER and PR and high grade, I recommend antiestrogen therapy, which decrease her risk of future breast cancer by ~40%.  I discussed the potential side effect -She will likely benefit from breast radiation if she undergo lumpectomy to decrease the risk of breast cancer. She is interested. -Given  her advanced age, it is also reasonable to have either adjuvant radiation or antiestrogen therapy alone, not both. She is more interested in radiation. -We also discussed that biopsy may have sampling limitation, we will review her surgical path, to see if she has any invasive carcinoma components. -We discussed breast cancer surveillance after she completes treatment, Including annual mammogram, breast exam every 6-12 months.  2. Osteoporosis -Her most recent DEXA on file was in 08/2007 showing a T-score of -3.3 (left femur). She previously took Fosamax for 5 years (~2003-2008) -she is on calcium and vit D supplements   PLAN:  -She will have lumpectomy soon -I'll see her after radiation to finalize her adjuvant antiestrogen therapy. -I discussed the open blood draw clinical trial, and referred her to research    Oncology History Overview Note   Cancer Staging  Ductal carcinoma in situ (DCIS) of right breast Staging form: Breast, AJCC 8th Edition - Clinical stage from 03/15/2021: Stage 0 (cTis (DCIS), cN0, cM0, G2, ER+, PR+, HER2: Not Assessed) - Signed by Truitt Merle, MD on 03/26/2021    Ductal carcinoma in situ (DCIS) of right breast  03/05/2021 Mammogram   CLINICAL DATA:  Bilateral screening recall for a right breast distortion and possible masses and a possible left breast mass.   EXAM: DIGITAL DIAGNOSTIC BILATERAL MAMMOGRAM WITH TOMOSYNTHESIS AND CAD; ULTRASOUND LEFT BREAST LIMITED; ULTRASOUND RIGHT BREAST LIMITED  IMPRESSION: 1. There is a prominent distortion in the superior retroareolar right breast with a sonographic correlate at 12 o'clock. Cine images through this region demonstrates that the actual area of abnormality may be broader than the 1.5 cm ultrasound measurement.  2. There is an indeterminate 5 mm mass in the right breast at 3 o'clock.   3.  There is an indeterminate 5 mm mass in the right breast at 9:30.   4.  No evidence of bilateral axillary  lymphadenopathy.   5.  The mass in the left breast corresponds with a benign cyst.   03/15/2021 Cancer Staging   Staging form: Breast, AJCC 8th Edition - Clinical stage from 03/15/2021: Stage 0 (cTis (DCIS), cN0, cM0, G2, ER+, PR+, HER2: Not Assessed) - Signed by Truitt Merle, MD on 03/26/2021 Stage prefix: Initial diagnosis Histologic grading system: 3 grade system    03/15/2021 Initial Biopsy   Diagnosis 1. Breast, right, needle core biopsy, 12 o'clock, 1cmfn, ribbon -DUCTAL CARCINOMA IN SITU, INTERMEDIATE NUCLEAR GRADE, SOLID TYPE WITHOUT NECROSIS -NEGATIVE FOR INVASIVE CARCINOMA -COMPLEX SCLEROSING LESION WITH MICROCALCIFICATIONS -PROLIFERATIVE FIBROCYSTIC CHANGES INCLUDING EPITHELIAL HYPERPLASIA WITHOUT ATYPIA 2. Breast, right, needle core biopsy, 3 o'clock, 5cmfn, coil -RADIAL SCAR/COMPLEX SCLEROSING LESION -NEGATIVE FOR MICROCALCIFICATIONS -NEGATIVE FOR CARCINOMA 3. Breast, right, needle core biopsy, 9:30 o'clock, 5cmfn, heart -HIGH-GRADE DUCTAL CARCINOMA IN SITU, SOLID TYPE WITHOUT NECROSIS INVOLVING A FIBROADENOMA -NEGATIVE FOR INVASIVE CARCINOMA -NEGATIVE FOR CALCIFICATIONS  1. PROGNOSTIC INDICATORS Results: Estrogen Receptor: 90%, POSITIVE, STRONG STAINING INTENSITY Progesterone Receptor: 40%, POSITIVE, STRONG STAINING INTENSITY  3. PROGNOSTIC INDICATORS Results: Estrogen Receptor: 100%, POSITIVE, STRONG STAINING INTENSITY Progesterone Receptor: 10%, POSITIVE, STRONG STAINING INTENSITY   03/21/2021 Initial Diagnosis   Ductal carcinoma in situ (DCIS) of right breast      HISTORY OF PRESENTING ILLNESS:  Jacqueline Ortiz 76 y.o. female is a here because of breast cancer. The patient was referred by [facility]. The patient presents to the clinic today accompanied by her husband.   She had routine screening mammography on 02/04/21 showing a possible abnormality in the bilateral breasts. She underwent bilateral diagnostic mammography and bilateral breast ultrasonography  on 03/05/21 showing: distortion in superior retroareolar right breast; indeterminate 5 mm mass in right breast at 3 o'clock; indeterminate 5 mm mass in right breast at 9:30; left breast mass corresponds with benign cyst; no bilateral lymphadenopathy.  Biopsy on 03/15/21 showed: DCIS to 12 o'clock (intermediate grade) and 9:30 (high grade). Prognostic indicators significant for: estrogen receptor, 90/100% positive and progesterone receptor, 40/10% positive.   Today the patient notes they felt/feeling prior/after... -she did not feel the mass herself -she denies any new concerns  She has a PMHx of.... -s/p hiatal hernia repair -paralyzed vocal cords, has undergone surgery -osteoporosis -anxiety, on lexapro -scoliosis -arthritis, in her hands  Socially... -she is married with one child -she reports skin cancer in her brother and father and colon cancer in her maternal grandmother.  GYN HISTORY  Menarchal: 65 years old LMP: ~76 years old Contraceptive: HRT:  G2P1: one miscarriage    REVIEW OF SYSTEMS:    Constitutional: Denies fevers, chills or abnormal night sweats Eyes: Denies blurriness of vision, double vision or watery eyes Ears, nose, mouth, throat, and face: Denies mucositis or sore throat Respiratory: Denies cough, dyspnea or wheezes Cardiovascular: Denies palpitation, chest discomfort or lower extremity swelling Gastrointestinal:  Denies nausea, heartburn or change in bowel habits Skin: Denies abnormal skin rashes Lymphatics: Denies new lymphadenopathy or easy bruising Neurological:Denies numbness, tingling or new weaknesses Behavioral/Psych: Mood is stable, no new changes  All other systems were reviewed with the patient and are negative.   MEDICAL HISTORY:  Past Medical History:  Diagnosis Date   Allergic rhinosinusitis    usual daily sinus drainage  and post nasal drip   Arthritis    Depression    Diverticulosis    Dry eyes, bilateral    Duodenal diverticulum     Dyslipidemia    Eczema    Esophageal stricture    GERD (gastroesophageal reflux disease)    remains an issue    Hemorrhoids    internal and external- remains- treats as needed with topical gels   Hyperlipidemia    Hypertension    Nephrolithiasis    Osteoporosis    Panic disorder    Paralyzed vocal cords    30 years ago/ per pt unsure of cause" speaks in soft whispery tone is normal"   Scoliosis deformity of spine    very severe"can't lay flat with out cushion"   Tubular adenoma of colon 09/06/2004   Dr. Silvano Rusk    SURGICAL HISTORY: Past Surgical History:  Procedure Laterality Date   BREAST BIOPSY Right 03/15/2021   CESAREAN SECTION     COLONOSCOPY  multiple   Dr. Silvano Rusk   DILATION AND CURETTAGE OF UTERUS     ESOPHAGOGASTRODUODENOSCOPY  02/02/2003   Dr. Silvano Rusk   ESOPHAGOGASTRODUODENOSCOPY N/A 10/21/2015   Procedure: ESOPHAGOGASTRODUODENOSCOPY (EGD);  Surgeon: Milus Banister, MD;  Location: [facility];  Service: Endoscopy;  Laterality: N/A;   KNEE SURGERY Right    right fx- retained hardware   TONSILLECTOMY     TUBAL LIGATION     UPPER GASTROINTESTINAL ENDOSCOPY     vocal cord surgery     "did improve condition"    SOCIAL HISTORY: Social History   Socioeconomic History   Marital status: Married    Spouse name: Not on file   Number of children: 1   Years of education: Not on file   Highest education level: Not on file  Occupational History   Not on file  Tobacco Use   Smoking status: Never   Smokeless tobacco: Never  Vaping Use   Vaping Use: Never used  Substance and Sexual Activity   Alcohol use: No   Drug use: No   Sexual activity: Yes    Birth control/protection: None    Comment: Post menopause  Other Topics Concern   Not on file  Social History Narrative   Not on file   Social Determinants of Health   Financial Resource Strain: Not on file  Food Insecurity: Not on file  Transportation Needs: Not on file  Physical  Activity: Not on file  Stress: Not on file  Social Connections: Not on file  Intimate Partner Violence: Not on file    FAMILY HISTORY: Family History  Problem Relation Age of Onset   Heart disease Mother    Diabetes Mother    Cancer Father        skin cancer   Heart disease Father    Arthritis Father    Heart disease Sister    Cancer Brother        skin cancer   Colon polyps Brother    Colon cancer Maternal Grandmother    Cancer Maternal Grandmother 80       colon cancer   Cancer Other        gastric   Esophageal cancer Neg Hx    Rectal cancer Neg Hx    Stomach cancer Neg Hx    Breast cancer Neg Hx     ALLERGIES:  has No Known Allergies.  MEDICATIONS:  Current Outpatient Medications  Medication Sig Dispense Refill   acetaminophen (TYLENOL) 325 MG  tablet Take 650 mg by mouth 2 (two) times daily.     atorvastatin (LIPITOR) 20 MG tablet Take 20 mg by mouth daily.      Calcium-Magnesium-Vitamin D [id number] MG-MG-UNIT CHEW Chew 2 capsules by mouth 2 (two) times daily.     escitalopram (LEXAPRO) 10 MG tablet Take 10 mg by mouth daily.     Ketotifen Fumarate (REFRESH EYE ITCH RELIEF OP) Place into both eyes as needed.     triamterene-hydrochlorothiazide (MAXZIDE-25) 37.5-25 MG tablet Take 1 tablet by mouth daily.     No current facility-administered medications for this visit.    PHYSICAL EXAMINATION: ECOG PERFORMANCE STATUS: 0 - Asymptomatic  Vitals:   03/27/21 1246  BP: 132/80  Pulse: 78  Resp: 18  Temp: 97.7 F (36.5 C)  SpO2: 100%   Filed Weights   03/27/21 1246  Weight: 132 lb (59.9 kg)    GENERAL:alert, no distress and comfortable SKIN: skin color, texture, turgor are normal, no rashes or significant lesions EYES: normal, Conjunctiva are pink and non-injected, sclera clear  NECK: supple, thyroid normal size, non-tender, without nodularity LYMPH:  no palpable lymphadenopathy in the cervical, axillary  ABDOMEN:abdomen soft, non-tender and normal bowel  sounds Musculoskeletal:no cyanosis of digits and no clubbing  NEURO: alert & oriented x 3 with fluent speech, no focal motor/sensory deficits BREAST: No palpable mass, nodules or adenopathy bilaterally. Breast exam benign.  LABORATORY DATA:  I have reviewed the data as listed CBC Latest Ref Rng & Units 03/27/2021 01/31/2016 10/21/2015  WBC 4.0 - 10.5 K/uL 5.6 5.5 8.9  Hemoglobin 12.0 - 15.0 g/dL 13.6 13.6 15.2(H)  Hematocrit 36.0 - 46.0 % 42.0 41.5 46.8(H)  Platelets 150 - 400 K/uL 223 221 244    CMP Latest Ref Rng & Units 03/27/2021 02/07/2016 10/21/2015  Glucose 70 - 99 mg/dL 192(H) 170(H) 113(H)  BUN 8 - 23 mg/dL '11 8 14  ' Creatinine 0.44 - 1.00 mg/dL 0.94 0.63 0.90  Sodium 135 - 145 mmol/L 137 137 143  Potassium 3.5 - 5.1 mmol/L 3.2(L) 3.0(L) 3.6  Chloride 98 - 111 mmol/L 98 106 109  CO2 22 - 32 mmol/L '30 26 22  ' Calcium 8.9 - 10.3 mg/dL 9.9 8.1(L) 9.5  Total Protein 6.5 - 8.1 g/dL 7.0 - 8.0  Total Bilirubin 0.3 - 1.2 mg/dL 0.4 - 0.6  Alkaline Phos 38 - 126 U/L 76 - 79  AST 15 - 41 U/L 24 - 30  ALT 0 - 44 U/L 22 - 30     RADIOGRAPHIC STUDIES: I have personally reviewed the radiological images as listed and agreed with the findings in the report. US BREAST LTD UNI LEFT INC AXILLA  Result Date: 03/05/2021 CLINICAL DATA:  Bilateral screening recall for a right breast distortion and possible masses and a possible left breast mass. EXAM: DIGITAL DIAGNOSTIC BILATERAL MAMMOGRAM WITH TOMOSYNTHESIS AND CAD; ULTRASOUND LEFT BREAST LIMITED; ULTRASOUND RIGHT BREAST LIMITED TECHNIQUE: Bilateral digital diagnostic mammography and breast tomosynthesis was performed. The images were evaluated with computer-aided detection.; Targeted ultrasound examination of the left breast was performed.; Targeted ultrasound examination of the right breast was performed COMPARISON:  Previous exam(s). ACR Breast Density Category c: The breast tissue is heterogeneously dense, which may obscure small masses. FINDINGS:  Diagnostic images of the right breast demonstrate a persistent prominent area of distortion in the superior anterior right breast. In the lateral middle depth of the right breast there is a 5-6 mm oval circumscribed mass. In the medial right breast, middle to posterior depth  there is a lobulated mass measuring approximately 8 mm. There is a possible asymmetry at the same axis but closer to the nipple. Diagnostic images of the left breast demonstrates a persistent oval circumscribed mass at 5-6 o'clock. Images targeted to the right breast at 12 o'clock, 1 cm from the nipple demonstrates an irregular hypoechoic mass measuring at least 1.5 x 0.6 x 1.1 cm. However, cine clips throughout the area demonstrates what appears to be abnormal tissue insinuating through a broader span at than just 1.5 cm, but there are no clearly defined borders. Ultrasound of the right breast at 3 o'clock, 5 cm from the nipple demonstrates a hypoechoic oval mass with indistinct margins measuring 5 mm. A benign-appearing cyst is seen in the retroareolar right breast at 4 o'clock measuring 5 mm. In the retroareolar right breast at 9 o'clock, there is a mildly complicated cyst measuring 7 mm. In the right breast at 9:30, 6 cm from the nipple there is a hypoechoic oval shadowing mass with indistinct margins measuring 5 x 4 x 4 mm. Ultrasound of the right axilla demonstrates multiple normal-appearing lymph nodes. Ultrasound of the left breast at 6 o'clock, 2 cm from the nipple demonstrates an anechoic oval circumscribed mass measuring 9 x 5 x 8 mm. The no abnormal lymph nodes are found in the left axilla. IMPRESSION: 1. There is a prominent distortion in the superior retroareolar right breast with a sonographic correlate at 12 o'clock. Cine images through this region demonstrates that the actual area of abnormality may be broader than the 1.5 cm ultrasound measurement. 2. There is an indeterminate 5 mm mass in the right breast at 3 o'clock. 3.   There is an indeterminate 5 mm mass in the right breast at 9:30. 4.  No evidence of bilateral axillary lymphadenopathy. 5.  The mass in the left breast corresponds with a benign cyst. RECOMMENDATION: 1. Ultrasound guided biopsy is recommended for the site of distortion in the right breast at 12 o'clock, and the small indeterminate masses at 3 o'clock and at 9:30 in the right breast. The procedures have been scheduled for 03/15/2021 at 8:30 a.m. 2. If malignancy is found on biopsy, bilateral breast MRI is recommended for evaluation of extent of disease given the multiplicity of findings an ill-defined appearance of the suspicious distortion. I have discussed the findings and recommendations with the patient. If applicable, a reminder letter will be sent to the patient regarding the next appointment. BI-RADS CATEGORY  5: Highly suggestive of malignancy. Electronically Signed   By: Ammie Ferrier M.D.   On: 03/05/2021 15:11  US BREAST LTD UNI RIGHT INC AXILLA  Result Date: 03/05/2021 CLINICAL DATA:  Bilateral screening recall for a right breast distortion and possible masses and a possible left breast mass. EXAM: DIGITAL DIAGNOSTIC BILATERAL MAMMOGRAM WITH TOMOSYNTHESIS AND CAD; ULTRASOUND LEFT BREAST LIMITED; ULTRASOUND RIGHT BREAST LIMITED TECHNIQUE: Bilateral digital diagnostic mammography and breast tomosynthesis was performed. The images were evaluated with computer-aided detection.; Targeted ultrasound examination of the left breast was performed.; Targeted ultrasound examination of the right breast was performed COMPARISON:  Previous exam(s). ACR Breast Density Category c: The breast tissue is heterogeneously dense, which may obscure small masses. FINDINGS: Diagnostic images of the right breast demonstrate a persistent prominent area of distortion in the superior anterior right breast. In the lateral middle depth of the right breast there is a 5-6 mm oval circumscribed mass. In the medial right breast,  middle to posterior depth there is a lobulated mass measuring approximately  8 mm. There is a possible asymmetry at the same axis but closer to the nipple. Diagnostic images of the left breast demonstrates a persistent oval circumscribed mass at 5-6 o'clock. Images targeted to the right breast at 12 o'clock, 1 cm from the nipple demonstrates an irregular hypoechoic mass measuring at least 1.5 x 0.6 x 1.1 cm. However, cine clips throughout the area demonstrates what appears to be abnormal tissue insinuating through a broader span at than just 1.5 cm, but there are no clearly defined borders. Ultrasound of the right breast at 3 o'clock, 5 cm from the nipple demonstrates a hypoechoic oval mass with indistinct margins measuring 5 mm. A benign-appearing cyst is seen in the retroareolar right breast at 4 o'clock measuring 5 mm. In the retroareolar right breast at 9 o'clock, there is a mildly complicated cyst measuring 7 mm. In the right breast at 9:30, 6 cm from the nipple there is a hypoechoic oval shadowing mass with indistinct margins measuring 5 x 4 x 4 mm. Ultrasound of the right axilla demonstrates multiple normal-appearing lymph nodes. Ultrasound of the left breast at 6 o'clock, 2 cm from the nipple demonstrates an anechoic oval circumscribed mass measuring 9 x 5 x 8 mm. The no abnormal lymph nodes are found in the left axilla. IMPRESSION: 1. There is a prominent distortion in the superior retroareolar right breast with a sonographic correlate at 12 o'clock. Cine images through this region demonstrates that the actual area of abnormality may be broader than the 1.5 cm ultrasound measurement. 2. There is an indeterminate 5 mm mass in the right breast at 3 o'clock. 3.  There is an indeterminate 5 mm mass in the right breast at 9:30. 4.  No evidence of bilateral axillary lymphadenopathy. 5.  The mass in the left breast corresponds with a benign cyst. RECOMMENDATION: 1. Ultrasound guided biopsy is recommended for the  site of distortion in the right breast at 12 o'clock, and the small indeterminate masses at 3 o'clock and at 9:30 in the right breast. The procedures have been scheduled for 03/15/2021 at 8:30 a.m. 2. If malignancy is found on biopsy, bilateral breast MRI is recommended for evaluation of extent of disease given the multiplicity of findings an ill-defined appearance of the suspicious distortion. I have discussed the findings and recommendations with the patient. If applicable, a reminder letter will be sent to the patient regarding the next appointment. BI-RADS CATEGORY  5: Highly suggestive of malignancy. Electronically Signed   By: Ammie Ferrier M.D.   On: 03/05/2021 15:11  MM DIAG BREAST TOMO BILATERAL  Result Date: 03/05/2021 CLINICAL DATA:  Bilateral screening recall for a right breast distortion and possible masses and a possible left breast mass. EXAM: DIGITAL DIAGNOSTIC BILATERAL MAMMOGRAM WITH TOMOSYNTHESIS AND CAD; ULTRASOUND LEFT BREAST LIMITED; ULTRASOUND RIGHT BREAST LIMITED TECHNIQUE: Bilateral digital diagnostic mammography and breast tomosynthesis was performed. The images were evaluated with computer-aided detection.; Targeted ultrasound examination of the left breast was performed.; Targeted ultrasound examination of the right breast was performed COMPARISON:  Previous exam(s). ACR Breast Density Category c: The breast tissue is heterogeneously dense, which may obscure small masses. FINDINGS: Diagnostic images of the right breast demonstrate a persistent prominent area of distortion in the superior anterior right breast. In the lateral middle depth of the right breast there is a 5-6 mm oval circumscribed mass. In the medial right breast, middle to posterior depth there is a lobulated mass measuring approximately 8 mm. There is a possible asymmetry at the same  axis but closer to the nipple. Diagnostic images of the left breast demonstrates a persistent oval circumscribed mass at 5-6 o'clock.  Images targeted to the right breast at 12 o'clock, 1 cm from the nipple demonstrates an irregular hypoechoic mass measuring at least 1.5 x 0.6 x 1.1 cm. However, cine clips throughout the area demonstrates what appears to be abnormal tissue insinuating through a broader span at than just 1.5 cm, but there are no clearly defined borders. Ultrasound of the right breast at 3 o'clock, 5 cm from the nipple demonstrates a hypoechoic oval mass with indistinct margins measuring 5 mm. A benign-appearing cyst is seen in the retroareolar right breast at 4 o'clock measuring 5 mm. In the retroareolar right breast at 9 o'clock, there is a mildly complicated cyst measuring 7 mm. In the right breast at 9:30, 6 cm from the nipple there is a hypoechoic oval shadowing mass with indistinct margins measuring 5 x 4 x 4 mm. Ultrasound of the right axilla demonstrates multiple normal-appearing lymph nodes. Ultrasound of the left breast at 6 o'clock, 2 cm from the nipple demonstrates an anechoic oval circumscribed mass measuring 9 x 5 x 8 mm. The no abnormal lymph nodes are found in the left axilla. IMPRESSION: 1. There is a prominent distortion in the superior retroareolar right breast with a sonographic correlate at 12 o'clock. Cine images through this region demonstrates that the actual area of abnormality may be broader than the 1.5 cm ultrasound measurement. 2. There is an indeterminate 5 mm mass in the right breast at 3 o'clock. 3.  There is an indeterminate 5 mm mass in the right breast at 9:30. 4.  No evidence of bilateral axillary lymphadenopathy. 5.  The mass in the left breast corresponds with a benign cyst. RECOMMENDATION: 1. Ultrasound guided biopsy is recommended for the site of distortion in the right breast at 12 o'clock, and the small indeterminate masses at 3 o'clock and at 9:30 in the right breast. The procedures have been scheduled for 03/15/2021 at 8:30 a.m. 2. If malignancy is found on biopsy, bilateral breast MRI is  recommended for evaluation of extent of disease given the multiplicity of findings an ill-defined appearance of the suspicious distortion. I have discussed the findings and recommendations with the patient. If applicable, a reminder letter will be sent to the patient regarding the next appointment. BI-RADS CATEGORY  5: Highly suggestive of malignancy. Electronically Signed   By: Ammie Ferrier M.D.   On: 03/05/2021 15:11  MM CLIP PLACEMENT RIGHT  Result Date: 03/15/2021 CLINICAL DATA:  Status post 3 site ultrasound-guided biopsies today. EXAM: 3D DIAGNOSTIC RIGHT MAMMOGRAM POST ULTRASOUND BIOPSY x3 COMPARISON:  Previous exam(s). FINDINGS: 3D Mammographic images were obtained following ultrasound-guided biopsy of distortion within the upper retroareolar RIGHT breast, 12 o'clock axis, 1 cm from the nipple and ultrasound-guided biopsy of a RIGHT breast mass at the 3 o'clock axis and ultrasound-guided biopsy of a RIGHT breast mass at the 9:30 o'clock axis. The biopsy marking clips are in expected position at the sites of biopsy. IMPRESSION: 1. Appropriate positioning of the ribbon shaped biopsy marking clip at the site of biopsy in the upper retroareolar RIGHT breast corresponding to the targeted distortion at the 12 o'clock axis. 2. Appropriate positioning of the coil shaped biopsy marking clip at the site of biopsy in the inner RIGHT breast corresponding to the targeted mass at the 3 o'clock axis. 3. Appropriate positioning of the heart shaped biopsy marking clip at the site of biopsy in  the upper-outer quadrant of the RIGHT breast, at posterior depth, corresponding to the targeted mass at the 9:30 o'clock axis. Final Assessment: Post Procedure Mammograms for Marker Placement Electronically Signed   By: Franki Cabot M.D.   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 This was found to be concordant by Dr. Franki Cabot. 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 This was found to be discordant by Dr. Franki Cabot, with excision recommended. 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 This was found to be concordant by Dr. Franki Cabot. Pathology results were discussed with the patient by telephone. The patient reported doing well after the biopsies with tenderness and bruising at the sites. Post biopsy instructions and care were reviewed and questions were answered. The patient was encouraged to call [facility] for any additional concerns. My direct phone number was provided. The patient was referred to [facility] at [facility] on March 27, 2021. Bilateral breast MRI is recommended for evaluation of extent of disease given the high grade histology, multiplicity of findings and ill-defined extent of the RIGHT breast distortion. Pathology results reported by Terie Purser, RN on 03/18/2021. Electronically Signed   By: Franki Cabot M.D.   On: 03/18/2021 15:52   Result Date: 03/18/2021 CLINICAL DATA:  Patient with prominent distortion in the superior retroareolar RIGHT breast with sonographic correlate at 12 o'clock axis. Patient presents today for ultrasound-guided biopsy. Patient with additional an indeterminate 5 mm mass in the RIGHT breast at the 3 o'clock axis. Patient presents today for ultrasound-guided biopsy. Patient with additional  indeterminate 5 mm mass in the RIGHT breast at the 9:30 o'clock axis. Patient presents today for ultrasound-guided biopsy. EXAM: ULTRASOUND GUIDED RIGHT BREAST CORE NEEDLE BIOPSY x3 COMPARISON:  Previous exam(s). PROCEDURE: I met with the patient and we discussed the procedure of ultrasound-guided biopsy, including benefits and alternatives. We discussed the high likelihood of a successful procedure. We discussed the risks of the procedure, including infection, bleeding, tissue injury, clip migration, and inadequate sampling. Informed written consent was given. The usual time-out protocol was performed immediately prior to the procedure. Site 1: Lesion quadrant: 12 o'clock Using sterile technique and 1% Lidocaine as local anesthetic, under direct ultrasound visualization, a 12 gauge spring-loaded device was used to perform biopsy of the distortion within the RIGHT breast at the 12 o'clock axis, 1 cm from the nipple, using a medial approach. At the conclusion of the procedure a ribbon shaped tissue marker clip was deployed into the biopsy cavity. Site 2: Lesion quadrant: 3 o'clock Using sterile technique and 1% Lidocaine as local anesthetic, under direct ultrasound visualization, a 12 gauge spring-loaded device was used to perform biopsy of the RIGHT breast mass at the 3 o'clock axis using a medial approach. At the conclusion of the procedure a coil shaped tissue marker clip was deployed into the biopsy cavity. Site 3: Lesion quadrant: Upper outer quadrant Using sterile technique and 1% Lidocaine as local anesthetic, under  direct ultrasound visualization, a 12 gauge spring-loaded device was used to perform biopsy of the RIGHT breast mass at the 9:30 o'clock axis using a inferolateral approach. At the conclusion of the procedure a heart shaped tissue marker clip was deployed into the biopsy cavity. Follow up 2 view mammogram was performed and dictated separately. IMPRESSION: 1. Ultrasound guided biopsy of the RIGHT  breast distortion, 12 o'clock axis, 1 cm from the nipple. Ribbon shaped clip placed at the biopsy site. No apparent complications. 2. Ultrasound guided biopsy of the mass in the RIGHT breast at the 3 o'clock axis. Coil shaped clip placed at the biopsy site. Mild postprocedural bleeding. Manual compression applied and hemostasis obtained. No apparent complications. 3. Ultrasound guided biopsy of the mass in the RIGHT breast at the 9:30 o'clock axis. Heart shaped clip placed at the biopsy site. No apparent complications. Electronically Signed: By: Franki Cabot M.D. 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 This was found to be concordant by Dr. Franki Cabot. 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 This was found to be discordant by Dr. Franki Cabot, with excision recommended. 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 This was found to be concordant by Dr. Franki Cabot. Pathology results were discussed with the patient by telephone. The patient reported doing well after the biopsies with tenderness and bruising at the sites. Post biopsy instructions and care were reviewed and questions were answered. The patient was encouraged to call [facility] for any additional concerns. My direct phone number was provided. The patient was referred to [facility] at  [facility] on March 27, 2021. Bilateral breast MRI is recommended for evaluation of extent of disease given the high grade histology, multiplicity of findings and ill-defined extent of the RIGHT breast distortion. Pathology results reported by Terie Purser, RN on 03/18/2021. Electronically Signed   By: Franki Cabot M.D.   On: 03/18/2021 15:52   Result Date: 03/18/2021 CLINICAL DATA:  Patient with prominent distortion in the superior retroareolar RIGHT breast with sonographic correlate at 12 o'clock axis. Patient presents today for ultrasound-guided biopsy. Patient with additional an indeterminate 5 mm mass in the RIGHT breast at the 3 o'clock axis. Patient presents today for ultrasound-guided biopsy. Patient with additional indeterminate 5 mm mass in the RIGHT breast at the 9:30 o'clock axis. Patient presents today for ultrasound-guided biopsy. EXAM: ULTRASOUND GUIDED RIGHT BREAST CORE NEEDLE BIOPSY x3 COMPARISON:  Previous exam(s). PROCEDURE: I met with the patient and we discussed the procedure of ultrasound-guided biopsy, including benefits and alternatives. We discussed the high likelihood of a successful procedure. We discussed the risks of the procedure, including infection, bleeding, tissue injury, clip migration, and inadequate sampling. Informed written consent was given. The usual time-out protocol was performed immediately prior to the procedure. Site 1: Lesion quadrant: 12 o'clock Using sterile technique and 1% Lidocaine as local anesthetic, under direct ultrasound visualization, a 12 gauge spring-loaded device was used to perform biopsy of  the distortion within the RIGHT breast at the 12 o'clock axis, 1 cm from the nipple, using a medial approach. At the conclusion of the procedure a ribbon shaped tissue marker clip was deployed into the biopsy cavity. Site 2: Lesion quadrant: 3 o'clock Using sterile technique and 1% Lidocaine as local anesthetic, under direct ultrasound  visualization, a 12 gauge spring-loaded device was used to perform biopsy of the RIGHT breast mass at the 3 o'clock axis using a medial approach. At the conclusion of the procedure a coil shaped tissue marker clip was deployed into the biopsy cavity. Site 3: Lesion quadrant: Upper outer quadrant Using sterile technique and 1% Lidocaine as local anesthetic, under direct ultrasound visualization, a 12 gauge spring-loaded device was used to perform biopsy of the RIGHT breast mass at the 9:30 o'clock axis using a inferolateral approach. At the conclusion of the procedure a heart shaped tissue marker clip was deployed into the biopsy cavity. Follow up 2 view mammogram was performed and dictated separately. IMPRESSION: 1. Ultrasound guided biopsy of the RIGHT breast distortion, 12 o'clock axis, 1 cm from the nipple. Ribbon shaped clip placed at the biopsy site. No apparent complications. 2. Ultrasound guided biopsy of the mass in the RIGHT breast at the 3 o'clock axis. Coil shaped clip placed at the biopsy site. Mild postprocedural bleeding. Manual compression applied and hemostasis obtained. No apparent complications. 3. Ultrasound guided biopsy of the mass in the RIGHT breast at the 9:30 o'clock axis. Heart shaped clip placed at the biopsy site. No apparent complications. Electronically Signed: By: Franki Cabot M.D. 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 This was found to be concordant by Dr. Franki Cabot. 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  This was found to be discordant by Dr. Franki Cabot, with excision recommended. 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 This was found to be concordant by Dr. Franki Cabot. Pathology results were discussed with the patient by telephone. The patient reported doing well after the biopsies with tenderness and bruising at the sites. Post biopsy instructions and care were reviewed and questions were answered. The patient was encouraged to call [facility] for any additional concerns. My direct phone number was provided. The patient was referred to [facility] at [facility] on March 27, 2021. Bilateral breast MRI is recommended for evaluation of extent of disease given the high grade histology, multiplicity of findings and ill-defined extent of the RIGHT breast distortion. Pathology results reported by Terie Purser, RN on 03/18/2021. Electronically Signed   By: Franki Cabot M.D.   On: 03/18/2021 15:52   Result Date: 03/18/2021 CLINICAL DATA:  Patient with prominent distortion in the superior retroareolar RIGHT breast with sonographic correlate at 12 o'clock axis. Patient presents today for ultrasound-guided biopsy. Patient with additional an indeterminate 5 mm mass in the RIGHT breast at the 3 o'clock axis. Patient presents today for ultrasound-guided biopsy. Patient with additional indeterminate 5 mm mass in the  RIGHT breast at the 9:30 o'clock axis. Patient presents today for ultrasound-guided biopsy. EXAM: ULTRASOUND GUIDED RIGHT BREAST CORE NEEDLE BIOPSY x3 COMPARISON:  Previous exam(s). PROCEDURE: I met with the patient and we discussed the procedure of ultrasound-guided biopsy, including benefits and alternatives. We discussed the high likelihood of a successful procedure. We discussed the risks  of the procedure, including infection, bleeding, tissue injury, clip migration, and inadequate sampling. Informed written consent was given. The usual time-out protocol was performed immediately prior to the procedure. Site 1: Lesion quadrant: 12 o'clock Using sterile technique and 1% Lidocaine as local anesthetic, under direct ultrasound visualization, a 12 gauge spring-loaded device was used to perform biopsy of the distortion within the RIGHT breast at the 12 o'clock axis, 1 cm from the nipple, using a medial approach. At the conclusion of the procedure a ribbon shaped tissue marker clip was deployed into the biopsy cavity. Site 2: Lesion quadrant: 3 o'clock Using sterile technique and 1% Lidocaine as local anesthetic, under direct ultrasound visualization, a 12 gauge spring-loaded device was used to perform biopsy of the RIGHT breast mass at the 3 o'clock axis using a medial approach. At the conclusion of the procedure a coil shaped tissue marker clip was deployed into the biopsy cavity. Site 3: Lesion quadrant: Upper outer quadrant Using sterile technique and 1% Lidocaine as local anesthetic, under direct ultrasound visualization, a 12 gauge spring-loaded device was used to perform biopsy of the RIGHT breast mass at the 9:30 o'clock axis using a inferolateral approach. At the conclusion of the procedure a heart shaped tissue marker clip was deployed into the biopsy cavity. Follow up 2 view mammogram was performed and dictated separately. IMPRESSION: 1. Ultrasound guided biopsy of the RIGHT breast distortion, 12 o'clock axis, 1 cm from the nipple. Ribbon shaped clip placed at the biopsy site. No apparent complications. 2. Ultrasound guided biopsy of the mass in the RIGHT breast at the 3 o'clock axis. Coil shaped clip placed at the biopsy site. Mild postprocedural bleeding. Manual compression applied and hemostasis obtained. No apparent complications. 3. Ultrasound guided biopsy of the mass in the RIGHT breast at  the 9:30 o'clock axis. Heart shaped clip placed at the biopsy site. No apparent complications. Electronically Signed: By: Franki Cabot M.D. On: 03/15/2021 09:56    No orders of the defined types were placed in this encounter.   All questions were answered. The patient knows to call the clinic with any problems, questions or concerns. The total time spent in the appointment was 45 minutes.     Truitt Merle, MD 03/27/2021 9:15 PM  I, Wilburn Mylar, am acting as scribe for Truitt Merle, MD.   I have reviewed the above documentation for accuracy and completeness, and I agree with the above.

## 2021-03-27 NOTE — Research (Signed)
Trial:  [id number] - Tissue and Bodily Fluids: Translational Medicine: Discovery and Evaluation of Biomarkers/Pharmacogenomics for the Diagnosis and Personalized Management of Patients  ?Patient Jacqueline Ortiz was identified by Dr. Burr Medico as a potential candidate for the above listed study.  This Clinical Research Nurse met with ED RAYSON, [id number], on 03/27/21 in a manner and location that ensures patient privacy to discuss participation in the above listed research study.  Patient is Accompanied by her husband .  A copy of the informed consent document and separate HIPAA Authorization was provided to the patient.  Patient reads, speaks, and understands [place].   ?Patient was provided with the business card of this Nurse and encouraged to contact the research team with any questions.  Approximately 5 minutes were spent with the patient reviewing the informed consent documents.  Patient was provided the option of taking informed consent documents home to review and was encouraged to review at their convenience with their support network, including other care providers. Patient took the consent documents home to review. ?Foye Spurling, BSN, RN, CCRP ?Clinical Research Nurse II ?03/27/2021 3:03 PM ? ? ?

## 2021-03-28 ENCOUNTER — Encounter: Payer: Self-pay | Admitting: Genetic Counselor

## 2021-03-28 ENCOUNTER — Other Ambulatory Visit: Payer: Self-pay | Admitting: *Deleted

## 2021-03-28 DIAGNOSIS — Z800 Family history of malignant neoplasm of digestive organs: Secondary | ICD-10-CM | POA: Insufficient documentation

## 2021-03-28 DIAGNOSIS — D0511 Intraductal carcinoma in situ of right breast: Secondary | ICD-10-CM

## 2021-03-28 DIAGNOSIS — Z808 Family history of malignant neoplasm of other organs or systems: Secondary | ICD-10-CM | POA: Insufficient documentation

## 2021-03-28 NOTE — Progress Notes (Signed)
REFERRING PROVIDER: Truitt Merle, MD [address] [place], [place] [address]  PRIMARY PROVIDER: Burnard Bunting, MD  PRIMARY REASON FOR VISIT:  1. Ductal carcinoma in situ (DCIS) of right breast   2. Family history of melanoma   3. Family history of colon cancer     HISTORY OF PRESENT ILLNESS:   Ms. Marseille, a 76 y.o. female, was seen for a [facility] cancer genetics consultation during the breast multidisciplinary clinic at the request of Dr. Burr Medico due to a personal and family history of cancer.  Ms. Marcantel presents to clinic today to discuss the possibility of a hereditary predisposition to cancer, to discuss genetic testing, and to further clarify her future cancer risks, as well as potential cancer risks for family members.   In February 2023, at the age of 44, Ms. 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  CANCER HISTORY:  Oncology History Overview Note   Cancer Staging  Ductal carcinoma in situ (DCIS) of right breast Staging form: Breast, AJCC 8th Edition - Clinical stage from 03/15/2021: Stage 0 (cTis (DCIS), cN0, cM0, G2, ER+, PR+, HER2: Not Assessed) - Signed by Truitt Merle, MD on 03/26/2021    Ductal carcinoma in situ (DCIS) of right breast  03/05/2021 Mammogram   CLINICAL DATA:  Bilateral screening recall for a right breast distortion and possible masses and a possible left breast mass.   EXAM: DIGITAL DIAGNOSTIC BILATERAL MAMMOGRAM WITH TOMOSYNTHESIS AND CAD; ULTRASOUND LEFT BREAST LIMITED; ULTRASOUND RIGHT BREAST LIMITED  IMPRESSION: 1. There is a prominent distortion in the superior retroareolar right breast with a sonographic correlate at 12 o'clock. Cine images through this region demonstrates that the actual area of abnormality may be broader than the 1.5 cm ultrasound measurement.   2. There is an indeterminate 5 mm mass in the right breast at 3 o'clock.   3.  There is an indeterminate 5 mm mass in the right  breast at 9:30.   4.  No evidence of bilateral axillary lymphadenopathy.   5.  The mass in the left breast corresponds with a benign cyst.   03/15/2021 Cancer Staging   Staging form: Breast, AJCC 8th Edition - Clinical stage from 03/15/2021: Stage 0 (cTis (DCIS), cN0, cM0, G2, ER+, PR+, HER2: Not Assessed) - Signed by Truitt Merle, MD on 03/26/2021 Stage prefix: Initial diagnosis Histologic grading system: 3 grade system    03/15/2021 Initial Biopsy   Diagnosis 1. Breast, right, needle core biopsy, 12 o'clock, 1cmfn, ribbon -DUCTAL CARCINOMA IN SITU, INTERMEDIATE NUCLEAR GRADE, SOLID TYPE WITHOUT NECROSIS -NEGATIVE FOR INVASIVE CARCINOMA -COMPLEX SCLEROSING LESION WITH MICROCALCIFICATIONS -PROLIFERATIVE FIBROCYSTIC CHANGES INCLUDING EPITHELIAL HYPERPLASIA WITHOUT ATYPIA 2. Breast, right, needle core biopsy, 3 o'clock, 5cmfn, coil -RADIAL SCAR/COMPLEX SCLEROSING LESION -NEGATIVE FOR MICROCALCIFICATIONS -NEGATIVE FOR CARCINOMA 3. Breast, right, needle core biopsy, 9:30 o'clock, 5cmfn, heart -HIGH-GRADE DUCTAL CARCINOMA IN SITU, SOLID TYPE WITHOUT NECROSIS INVOLVING A FIBROADENOMA -NEGATIVE FOR INVASIVE CARCINOMA -NEGATIVE FOR CALCIFICATIONS  1. PROGNOSTIC INDICATORS Results: Estrogen Receptor: 90%, POSITIVE, STRONG STAINING INTENSITY Progesterone Receptor: 40%, POSITIVE, STRONG STAINING INTENSITY  3. PROGNOSTIC INDICATORS Results: Estrogen Receptor: 100%, POSITIVE, STRONG STAINING INTENSITY Progesterone Receptor: 10%, POSITIVE, STRONG STAINING INTENSITY   03/21/2021 Initial Diagnosis   Ductal carcinoma in situ (DCIS) of right breast     RISK FACTORS:  Menarche was at age 20.  First live birth at age 10.  OCP use for approximately 0 years.  Ovaries intact: yes.  Uterus intact: yes.  Menopausal status: postmenopausal.  HRT use: 0 years. Colonoscopy: yes; 2022. Mammogram within the last year: yes. 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 Silvano Rusk    Past Surgical History:  Procedure Laterality Date   BREAST BIOPSY Right 03/15/2021   CESAREAN SECTION     COLONOSCOPY  multiple   Dr. Silvano Rusk   DILATION AND CURETTAGE OF UTERUS     ESOPHAGOGASTRODUODENOSCOPY  02/02/2003   Dr. Silvano Rusk   ESOPHAGOGASTRODUODENOSCOPY N/A 10/21/2015   Procedure: ESOPHAGOGASTRODUODENOSCOPY (EGD);  Surgeon: Milus Banister, MD;  Location: [facility];  Service: Endoscopy;  Laterality: N/A;   KNEE SURGERY Right    right fx- retained hardware   TONSILLECTOMY     TUBAL LIGATION     UPPER GASTROINTESTINAL ENDOSCOPY     vocal cord surgery     "did improve condition"    Social History   Socioeconomic History   Marital status: Married    Spouse name: Not on file   Number of children: 1   Years of education: Not on file   Highest education level: Not on file  Occupational History   Not on file  Tobacco Use   Smoking status: Never   Smokeless tobacco: Never  Vaping Use   Vaping Use: Never used  Substance and Sexual Activity   Alcohol use: No   Drug use: No   Sexual activity: Yes    Birth control/protection: None    Comment: Post menopause  Other Topics Concern   Not on file  Social History Narrative   Not on file    Social Determinants of Health   Financial Resource Strain: Not on file  Food Insecurity: Not on file  Transportation Needs: Not on file  Physical Activity: Not on file  Stress: Not on file  Social Connections: Not on file     FAMILY HISTORY:  We obtained a detailed, 4-generation family history.  Significant diagnoses are listed below: Family History  Problem Relation Age of Onset   Heart disease Mother    Diabetes Mother    Melanoma Father    Heart disease Father    Arthritis Father    Heart disease Sister    Skin cancer Brother    Colon polyps Brother    Cancer Maternal Aunt 65       sinus cancer   Colon cancer Maternal Grandmother 80   Esophageal cancer Neg Hx    Rectal cancer Neg Hx    Stomach cancer Neg Hx    Breast cancer Neg Hx      Ms. Duquette's brother was diagnosed with  an unknown type of skin cancer. Her maternal aunt was diagnosed with sinus cancer at age 34, she is deceased. Her maternal grandmother was diagnosed with colon cancer at age 44, she is deceased. Her father was diagnosed with melanoma, he died at age 25.  Ms. Tafolla is unaware of previous family history of genetic testing for hereditary cancer risks. There is no reported Ashkenazi Jewish ancestry.   GENETIC COUNSELING ASSESSMENT: Ms. Granberry is a 76 y.o. female with a personal and family history of cancer which is somewhat suggestive of a hereditary cancer syndrome and predisposition to cancer given her multifocal breast cancer. We, therefore, discussed and recommended the following at today's visit.   DISCUSSION: We discussed that 5 - 10% of cancer is hereditary, with most cases of hereditary breast cancer associated with mutations in BRCA1/2.  There are other genes that can be associated with hereditary breast and breast cancer syndromes. Type of cancer risk and level of risk are gene-specific. We discussed that testing is beneficial for several reasons including knowing how to follow individuals after  completing their treatment, identifying whether potential treatment options would be beneficial, and understanding if other family members could be at risk for cancer and allowing them to undergo genetic testing.   We reviewed the characteristics, features and inheritance patterns of hereditary cancer syndromes. We also discussed genetic testing, including the appropriate family members to test, the process of testing, insurance coverage and turn-around-time for results. We discussed the implications of a negative, positive and/or variant of uncertain significant result. In order to get genetic test results in a timely manner so that Ms. Mehan can use these genetic test results for surgical decisions, we recommended Ms. Helf pursue genetic testing for the BRCAplus. Once complete, we recommend Ms. Vandehei pursue reflex genetic testing to a more comprehensive gene panel.   Ms. Montante  was offered a common hereditary cancer panel (36 genes) and an expanded pan-cancer panel (77 genes). Ms. Gebhardt was informed of the benefits and limitations of each panel, including that expanded pan-cancer panels contain genes that do not have clear management guidelines at this point in time.  We also discussed that as the number of genes included on a panel increases, the chances of variants of uncertain significance increases.  After considering the benefits and limitations of each gene panel, Ms. Allaire elected to have [facility] Panel+RNA.  The CancerNext gene panel offered by [employer] includes sequencing, rearrangement analysis, and RNA analysis for the following 36 genes:   APC, ATM, AXIN2, BARD1, BMPR1A, BRCA1, BRCA2, BRIP1, CDH1, CDK4, CDKN2A, CHEK2, DICER1, HOXB13, EPCAM, GREM1, MLH1, MSH2, MSH3, MSH6, MUTYH, NBN, NF1, NTHL1, PALB2, PMS2, POLD1, POLE, PTEN, RAD51C, RAD51D, RECQL, SMAD4, SMARCA4, STK11, and TP53.    Based on Ms. Swaby's personal and family history of cancer, she meets medical criteria for  genetic testing. Despite that she meets criteria, she may still have an out of pocket cost. We discussed that if her out of pocket cost for testing is over $100, the laboratory should contact them to discuss self-pay prices, patient pay assistance programs, if applicable, and other billing options.   PLAN: After considering the risks, benefits, and limitations, Ms. Galeno provided informed consent to pursue genetic testing and the blood sample was sent to [facility] for analysis of the CancerNext Panel. Results should be available within approximately 1-2 weeks' time, at which point they will be disclosed by telephone to Ms. Infantino, as will any additional recommendations warranted by these results.  Ms. Petrak will receive a summary of her genetic counseling visit and a copy of her results once available. This information will also be available in Epic.   Ms. Mayfield questions were answered to her satisfaction today. Our contact information was provided should additional questions or concerns arise. Thank you for the referral and allowing [place] to share in the care of your patient.   Lucille Passy, MS, [facility] Genetic Counselor [email or web address] (P) [phone]  The patient was seen for a total of 20 minutes in face-to-face genetic counseling.  The patient brought her husband.  Drs. Lindi Adie and/or Burr Medico were available to discuss this case as needed.  _______________________________________________________________________ For Office Staff:  Number of people involved in session: 2 Was an Intern/ student involved with case: no

## 2021-03-29 ENCOUNTER — Other Ambulatory Visit: Payer: Self-pay | Admitting: Surgery

## 2021-03-29 ENCOUNTER — Telehealth: Payer: Self-pay | Admitting: *Deleted

## 2021-03-29 DIAGNOSIS — D0511 Intraductal carcinoma in situ of right breast: Secondary | ICD-10-CM

## 2021-03-29 NOTE — Telephone Encounter (Signed)
MTG-015 - Tissue and Bodily Fluids: Translational Medicine: Discovery and Evaluation of Biomarkers/Pharmacogenomics for the Diagnosis and Personalized Management of Patients   ?Called patient to follow up on the study and see if she has any questions. Patient denied any questions and states she reviewed the consent form and would like to participate in the study. Patient can come in next week on Friday 04/05/21 at 1:30 pm.  She will meet with research nurse first and then go to lab for blood draw.  Thanked patient for her time and asked her to please call if any questions or changes before next Friday. Gave her my phone number again. She verbalized understanding.  ?Foye Spurling, BSN, RN, CCRP ?Clinical Research Nurse II ?03/29/2021 12:12 PM ? ?

## 2021-04-02 ENCOUNTER — Encounter: Payer: Self-pay | Admitting: *Deleted

## 2021-04-02 ENCOUNTER — Telehealth: Payer: Self-pay | Admitting: *Deleted

## 2021-04-02 ENCOUNTER — Other Ambulatory Visit: Payer: Self-pay | Admitting: *Deleted

## 2021-04-02 MED ORDER — LORAZEPAM 0.5 MG PO TABS: ORAL_TABLET | ORAL | 0 refills | Status: DC

## 2021-04-02 NOTE — Telephone Encounter (Signed)
Spoke with patient to follow up from [facility] 3/1 and assess navigation needs.  Patient states she would like to have some medication to relax her for the MRI breast.  Informed her I would call in Lorazepam for her and gave her instructions on how to take this.  Patient verbalized understanding. No other questions or concerns at this time.  Encouraged her to call should anything arise. ?

## 2021-04-03 ENCOUNTER — Encounter: Payer: Self-pay | Admitting: General Practice

## 2021-04-03 ENCOUNTER — Ambulatory Visit
Admission: RE | Admit: 2021-04-03 | Discharge: 2021-04-03 | Disposition: A | Discharge status: Home / Self Care | Payer: Medicare Other | Source: Ambulatory Visit | Attending: Surgery | Admitting: Surgery

## 2021-04-03 DIAGNOSIS — D0511 Intraductal carcinoma in situ of right breast: Secondary | ICD-10-CM

## 2021-04-03 MED ORDER — GADOBUTROL 1 MMOL/ML IV SOLN
6.0000 mL | Freq: Once | INTRAVENOUS | Status: AC | PRN
  Administered 2021-04-03: 6 mL via INTRAVENOUS

## 2021-04-03 NOTE — Progress Notes (Signed)
[facility] Psychosocial Distress Screening ?Spiritual Care ? ?Met with Jacqueline Ortiz and her husband Jacqueline Ortiz in [facility] to introduce [facility] team/resources, reviewing distress screen per protocol.  The patient scored a 1 on the Psychosocial Distress Thermometer which indicates mild distress. Also assessed for distress and other psychosocial needs.  ? ?ONCBCN DISTRESS SCREENING 04/03/2021  ?Screening Type Initial Screening  ?Distress experienced in past week (1-10) 1  ?Emotional problem type Nervousness/Anxiety  ?Referral to support programs Yes  ? ?[name] reports good family and church support and takes great comfort in the number of people who are praying for her. ? ?Provided empathic listening, normalization of feelings, and introduction to [facility] team and programming resources. ? ?Follow up needed: No. Per [name], no other needs or concerns at this time. She plans to contact chaplain as needed/desired. ? ? ?Chaplain Lorrin Jackson, MDiv, [facility] ?Pager [phone] ?Voicemail [phone] ? ? ? ? ?  ?

## 2021-04-04 ENCOUNTER — Other Ambulatory Visit: Payer: Self-pay

## 2021-04-04 DIAGNOSIS — D0511 Intraductal carcinoma in situ of right breast: Secondary | ICD-10-CM

## 2021-04-05 ENCOUNTER — Inpatient Hospital Stay: Payer: Medicare Other | Admitting: *Deleted

## 2021-04-05 ENCOUNTER — Encounter: Payer: Self-pay | Admitting: *Deleted

## 2021-04-05 ENCOUNTER — Other Ambulatory Visit: Payer: Self-pay

## 2021-04-05 ENCOUNTER — Telehealth: Payer: Self-pay | Admitting: *Deleted

## 2021-04-05 ENCOUNTER — Inpatient Hospital Stay: Payer: Medicare Other

## 2021-04-05 DIAGNOSIS — Z006 Encounter for examination for normal comparison and control in clinical research program: Secondary | ICD-10-CM

## 2021-04-05 LAB — RESEARCH LABS

## 2021-04-05 NOTE — Research (Signed)
MTG-015 - Tissue and Bodily Fluids: Translational Medicine: Discovery and Evaluation of Biomarkers/Pharmacogenomics for the Diagnosis and Personalized Management of Patients  ? ?This Nurse has reviewed this patient's inclusion and exclusion criteria as a second review and confirmed GIULIANNA ROCHA is eligible for study participation.  Patient will continue with enrollment. ? ?Vickii Penna, RN, BSN, CPN ?Clinical Research Nurse I ?[phone] ? ?04/05/2021 3:10 PM ? ? ?

## 2021-04-05 NOTE — Research (Signed)
Trial Name:  MTG-015 - Tissue and Bodily Fluids: Translational Medicine: Discovery and Evaluation of Biomarkers/Pharmacogenomics for the Diagnosis and Personalized Management of Patients   ? ?Patient Jacqueline Ortiz was identified by Dr. Burr Medico as a potential candidate for the above listed study.  This Clinical Research Nurse met with Jacqueline Ortiz, Jacqueline Ortiz on 04/05/21 in a manner and location that ensures patient privacy to discuss participation in the above listed research study.  Patient is Unaccompanied.  Patient was previously provided with informed consent documents.  Patient has not yet read the informed consent documents and so documents were reviewed page by page today. ? ?As outlined in the informed consent form, this Nurse and Jacqueline Ortiz discussed the purpose of the research study, the investigational nature of the study, study procedures and requirements for study participation, potential risks and benefits of study participation, as well as alternatives to participation.  This study is not blinded or double-blinded. The patient understands participation is voluntary and they may withdraw from study participation at any time.  This study does not involve randomization.  This study does not involve an investigational drug or device. This study does not involve a placebo. Patient understands enrollment is pending full eligibility review.  ? ?Confidentiality and how the patient's information will be used as part of study participation were discussed.  Patient was informed there is reimbursement provided for their time and effort spent on trial participation.  The patient is encouraged to discuss research study participation with their insurance provider to determine what costs they may incur as part of study participation, including research related injury.   ? ?All questions were answered to patient's satisfaction.  The informed consent and separate HIPAA Authorization was reviewed page by page.   The patient's mental and emotional status is appropriate to provide informed consent, and the patient verbalizes an understanding of study participation.  Patient has agreed to participate in the above listed research study and has voluntarily signed the informed consent version IRB approved 17 Dec 2020 and separate HIPAA Authorization, version IRB approved 17 Dec 2020  on 04/05/21 at 1:50PM.  The patient was provided with a copy of the signed informed consent form and separate HIPAA Authorization for their reference.  No study specific procedures were obtained prior to the signing of the informed consent document.  Approximately 20 minutes were spent with the patient reviewing the informed consent documents.  Patient was not requested to complete a Release of Information form.  ? ?Data Collection: After patient signed consent form, another 20 minutes was spent with patient collecting data for the study including medical history, medications, smoking, alcohol and family cancer history.    ?  ?Lab: Blood drawn for study per protocol.  Patient tolerated well without any adverse events.  ?  ?Gift Card: $50 gift card was provided to patient after the blood collection.  ?  ?Patient was thanked for her participation in this study and encouraged to contact research nurse if any questions in the future.  ? ?This Nurse has reviewed this patient's inclusion and exclusion criteria and confirmed Jacqueline Ortiz is eligible for study participation.  Patient will continue with enrollment. ? ?Foye Spurling, BSN, RN, CCRP ?Clinical Research Nurse II ?04/05/2021 2:33 PM ? ?  ?

## 2021-04-05 NOTE — Telephone Encounter (Signed)
Attempted to call patient regarding her MRI results and the need for an additional bx.  Left message of her identified VM.  ?

## 2021-04-08 ENCOUNTER — Other Ambulatory Visit: Payer: Self-pay | Admitting: Surgery

## 2021-04-08 ENCOUNTER — Telehealth: Payer: Self-pay | Admitting: *Deleted

## 2021-04-08 ENCOUNTER — Encounter: Payer: Self-pay | Admitting: *Deleted

## 2021-04-08 ENCOUNTER — Other Ambulatory Visit: Payer: Self-pay | Admitting: *Deleted

## 2021-04-08 DIAGNOSIS — R928 Other abnormal and inconclusive findings on diagnostic imaging of breast: Secondary | ICD-10-CM

## 2021-04-08 DIAGNOSIS — D0511 Intraductal carcinoma in situ of right breast: Secondary | ICD-10-CM

## 2021-04-08 NOTE — Telephone Encounter (Signed)
Left message for a return phone call regarding MRI results and the needs for add bx. ?

## 2021-04-08 NOTE — Telephone Encounter (Signed)
Received call back from patient in regards to her MRI. Informed her of the results and the need for add bx.  Patient verbalized understanding.  Informed her someone from GI will be calling her to schedule. ? ?

## 2021-04-09 ENCOUNTER — Encounter: Payer: Self-pay | Admitting: *Deleted

## 2021-04-09 NOTE — Pre-Procedure Instructions (Signed)
Surgical Instructions ? ? ? Your procedure is scheduled on Wednesday 04/17/21. ? ? Report to [facility] Main Entrance "A" at 08:30 A.M., then check in with the Admitting office. ? Call this number if you have problems the morning of surgery: ? [phone] ? ? If you have any questions prior to your surgery date call [phone]: Open Monday-Friday 8am-4pm ? ? ? Remember: ? Do not eat after midnight the night before your surgery ? ?You may drink clear liquids until 07:30 A.M. the morning of your surgery.   ?Clear liquids allowed are: Water, Non-Citrus Juices (without pulp), Carbonated Beverages, Clear Tea, Black Coffee ONLY (NO MILK, CREAM OR POWDERED CREAMER of any kind), and Gatorade ?  ? Take these medicines the morning of surgery with A SIP OF WATER:  ? acetaminophen (TYLENOL)  ? atorvastatin (LIPITOR)  ? loratadine (CLARITIN) ? ?Ketotifen Fumarate (REFRESH EYE ITCH RELIEF OP)- If needed ? ? ?As of today, STOP taking any Aspirin (unless otherwise instructed by your surgeon) Aleve, Naproxen, Ibuprofen, Motrin, Advil, Goody's, BC's, all herbal medications, fish oil, and all vitamins. ? ?         ?Do not wear jewelry or makeup ?Do not wear lotions, powders, perfumes/colognes, or deodorant. ?Do not shave 48 hours prior to surgery.  Men may shave face and neck. ?Do not bring valuables to the hospital. ?Do not wear nail polish, gel polish, artificial nails, or any other type of covering on natural nails (fingers and toes) ?If you have artificial nails or gel coating that need to be removed by a nail salon, please have this removed prior to surgery. Artificial nails or gel coating may interfere with anesthesia's ability to adequately monitor your vital signs. ? ?[facility] is not responsible for any belongings or valuables. .  ? ?Do NOT Smoke (Tobacco/Vaping)  24 hours prior to your procedure ? ?If you use a CPAP at night, you may bring your mask for your overnight stay. ?  ?Contacts, glasses, hearing aids, dentures  or partials may not be worn into surgery, please bring cases for these belongings ?  ?For patients admitted to the hospital, discharge time will be determined by your treatment team. ?  ?Patients discharged the day of surgery will not be allowed to drive home, and someone needs to stay with them for 24 hours. ? ?NO VISITORS WILL BE ALLOWED IN PRE-OP WHERE PATIENTS ARE PREPPED FOR SURGERY.  ONLY 1 SUPPORT PERSON MAY BE PRESENT IN THE WAITING ROOM WHILE YOU ARE IN SURGERY.  IF YOU ARE TO BE ADMITTED, ONCE YOU ARE IN YOUR ROOM YOU WILL BE ALLOWED TWO (2) VISITORS. 1 (ONE) VISITOR MAY STAY OVERNIGHT BUT MUST ARRIVE TO THE ROOM BY 8pm.  Minor children may have two parents present. Special consideration for safety and communication needs will be reviewed on a case by case basis. ? ?Special instructions:   ? ?Oral Hygiene is also important to reduce your risk of infection.  Remember - BRUSH YOUR TEETH THE MORNING OF SURGERY WITH YOUR REGULAR TOOTHPASTE ? ? ?[facility]- Preparing For Surgery ? ?Before surgery, you can play an important role. Because skin is not sterile, your skin needs to be as free of germs as possible. You can reduce the number of germs on your skin by washing with CHG (chlorahexidine gluconate) Soap before surgery.  CHG is an antiseptic cleaner which kills germs and bonds with the skin to continue killing germs even after washing.   ? ? ?Please do not use  if you have an allergy to CHG or antibacterial soaps. If your skin becomes reddened/irritated stop using the CHG.  ?Do not shave (including legs and underarms) for at least 48 hours prior to first CHG shower. It is OK to shave your face. ? ?Please follow these instructions carefully. ?  ? ? Shower the NIGHT BEFORE SURGERY and the MORNING OF SURGERY with CHG Soap.  ? If you chose to wash your hair, wash your hair first as usual with your normal shampoo. After you shampoo, rinse your hair and body thoroughly to remove the shampoo.  Then [employer] and  genitals (private parts) with your normal soap and rinse thoroughly to remove soap. ? ?After that Use CHG Soap as you would any other liquid soap. You can apply CHG directly to the skin and wash gently with a scrungie or a clean washcloth.  ? ?Apply the CHG Soap to your body ONLY FROM THE NECK DOWN.  Do not use on open wounds or open sores. Avoid contact with your eyes, ears, mouth and genitals (private parts). Wash Face and genitals (private parts)  with your normal soap.  ? ?Wash thoroughly, paying special attention to the area where your surgery will be performed. ? ?Thoroughly rinse your body with warm water from the neck down. ? ?DO NOT shower/wash with your normal soap after using and rinsing off the CHG Soap. ? ?Pat yourself dry with a CLEAN TOWEL. ? ?Wear CLEAN PAJAMAS to bed the night before surgery ? ?Place CLEAN SHEETS on your bed the night before your surgery ? ?DO NOT SLEEP WITH PETS. ? ? ?Day of Surgery: ? ?Take a shower with CHG soap. ?Wear Clean/Comfortable clothing the morning of surgery ?Do not apply any deodorants/lotions.   ?Remember to brush your teeth WITH YOUR REGULAR TOOTHPASTE. ? ? ? ?COVID testing ? ?If you are going to stay overnight or be admitted after your procedure/surgery and require a pre-op COVID test, please follow these instructions after your COVID test  ? ?You are not required to quarantine however you are required to wear a well-fitting mask when you are out and around people not in your household.  If your mask becomes wet or soiled, replace with a new one. ? ?Wash your hands often with soap and water for 20 seconds or clean your hands with an alcohol-based hand sanitizer that contains at least 60% alcohol. ? ?Do not share personal items. ? ?Notify your provider: ?if you are in close contact with someone who has COVID  ?or if you develop a fever of 100.4 or greater, sneezing, cough, sore throat, shortness of breath or body aches. ? ?  ?Please read over the following fact  sheets that you were given.  ? ?

## 2021-04-10 ENCOUNTER — Inpatient Hospital Stay (HOSPITAL_COMMUNITY)
Admission: RE | Admit: 2021-04-10 | Discharge: 2021-04-10 | Disposition: A | Discharge status: Home / Self Care | Payer: Medicare Other | Source: Ambulatory Visit

## 2021-04-12 ENCOUNTER — Encounter: Payer: Self-pay | Admitting: Genetic Counselor

## 2021-04-12 ENCOUNTER — Encounter: Payer: Self-pay | Admitting: *Deleted

## 2021-04-12 ENCOUNTER — Telehealth: Payer: Self-pay | Admitting: Genetic Counselor

## 2021-04-12 DIAGNOSIS — Z1379 Encounter for other screening for genetic and chromosomal anomalies: Secondary | ICD-10-CM | POA: Insufficient documentation

## 2021-04-12 NOTE — Telephone Encounter (Signed)
I contacted Ms. Piscopo to discuss her genetic testing results. No pathogenic variants were identified in the 36 genes analyzed. Detailed clinic note to follow. ? ?The test report has been scanned into EPIC and is located under the Molecular Pathology section of the Results Review tab.  A portion of the result report is included below for reference.  ? ?Lucille Passy, MS, [place] ?Genetic Counselor ?[email or web address] ?(P) [phone]  ? ? ?

## 2021-04-15 ENCOUNTER — Ambulatory Visit: Payer: Self-pay | Admitting: Genetic Counselor

## 2021-04-15 DIAGNOSIS — Z1379 Encounter for other screening for genetic and chromosomal anomalies: Secondary | ICD-10-CM

## 2021-04-15 NOTE — Progress Notes (Signed)
HPI:   ?Jacqueline Ortiz was previously seen in the [place] clinic due to a personal and family history of cancer and concerns regarding a hereditary predisposition to cancer. Please refer to our prior cancer genetics clinic note for more information regarding our discussion, assessment and recommendations, at the time. Jacqueline Ortiz recent genetic test results were disclosed to her, as were recommendations warranted by these results. These results and recommendations are discussed in more detail below. ? ?CANCER HISTORY:  ?Oncology History Overview Note  ? Cancer Staging  ?Ductal carcinoma in situ (DCIS) of right breast ?Staging form: Breast, AJCC 8th Edition ?- Clinical stage from 03/15/2021: Stage 0 (cTis (DCIS), cN0, cM0, G2, ER+, PR+, HER2: Not Assessed) - Signed by Truitt Merle, MD on 03/26/2021 ? ?  ?Ductal carcinoma in situ (DCIS) of right breast  ?03/05/2021 Mammogram  ? CLINICAL DATA:  Bilateral screening recall for a right breast ?distortion and possible masses and a possible left breast mass. ?  ?EXAM: ?DIGITAL DIAGNOSTIC BILATERAL MAMMOGRAM WITH TOMOSYNTHESIS AND CAD; ?ULTRASOUND LEFT BREAST LIMITED; ULTRASOUND RIGHT BREAST LIMITED ? ?IMPRESSION: ?1. There is a prominent distortion in the superior retroareolar ?right breast with a sonographic correlate at 12 o'clock. Cine images ?through this region demonstrates that the actual area of abnormality may be broader than the 1.5 cm ultrasound measurement. ?  ?2. There is an indeterminate 5 mm mass in the right breast at 3 ?o'clock. ?  ?3.  There is an indeterminate 5 mm mass in the right breast at 9:30. ?  ?4.  No evidence of bilateral axillary lymphadenopathy. ?  ?5.  The mass in the left breast corresponds with a benign cyst. ?  ?03/15/2021 Cancer Staging  ? Staging form: Breast, AJCC 8th Edition ?- Clinical stage from 03/15/2021: Stage 0 (cTis (DCIS), cN0, cM0, G2, ER+, PR+, HER2: Not Assessed) - Signed by Truitt Merle, MD on 03/26/2021 ?Stage prefix:  Initial diagnosis ?Histologic grading system: 3 grade system ? ?  ?03/15/2021 Initial Biopsy  ? Diagnosis ?1. Breast, right, needle core biopsy, 12 o'clock, 1cmfn, ribbon ?-DUCTAL CARCINOMA IN SITU, INTERMEDIATE NUCLEAR GRADE, SOLID TYPE WITHOUT NECROSIS ?-NEGATIVE FOR INVASIVE CARCINOMA ?-COMPLEX SCLEROSING LESION WITH MICROCALCIFICATIONS ?-PROLIFERATIVE FIBROCYSTIC CHANGES INCLUDING EPITHELIAL HYPERPLASIA WITHOUT ATYPIA ?2. Breast, right, needle core biopsy, 3 o'clock, 5cmfn, coil ?-RADIAL SCAR/COMPLEX SCLEROSING LESION ?-NEGATIVE FOR MICROCALCIFICATIONS ?-NEGATIVE FOR CARCINOMA ?3. Breast, right, needle core biopsy, 9:30 o'clock, 5cmfn, heart ?-HIGH-GRADE DUCTAL CARCINOMA IN SITU, SOLID TYPE WITHOUT NECROSIS INVOLVING A FIBROADENOMA ?-NEGATIVE FOR INVASIVE CARCINOMA ?-NEGATIVE FOR CALCIFICATIONS ? ?1. PROGNOSTIC INDICATORS ?Results: ?Estrogen Receptor: 90%, POSITIVE, STRONG STAINING INTENSITY ?Progesterone Receptor: 40%, POSITIVE, STRONG STAINING INTENSITY ? ?3. PROGNOSTIC INDICATORS ?Results: ?Estrogen Receptor: 100%, POSITIVE, STRONG STAINING INTENSITY ?Progesterone Receptor: 10%, POSITIVE, STRONG STAINING INTENSITY ?  ?03/21/2021 Initial Diagnosis  ? Ductal carcinoma in situ (DCIS) of right breast ?  ? Genetic Testing  ? [facility] CancerNext Panel was Negative. Report date is 04/08/2021. ? ?The CancerNext gene panel offered by [employer] includes sequencing, rearrangement analysis, and RNA analysis for the following 36 genes:   APC, ATM, AXIN2, BARD1, BMPR1A, BRCA1, BRCA2, BRIP1, CDH1, CDK4, CDKN2A, CHEK2, DICER1, HOXB13, EPCAM, GREM1, MLH1, MSH2, MSH3, MSH6, MUTYH, NBN, NF1, NTHL1, PALB2, PMS2, POLD1, POLE, PTEN, RAD51C, RAD51D, RECQL, SMAD4, SMARCA4, STK11, and TP53.  ?  ? ? ?FAMILY HISTORY:  ?We obtained a detailed, 4-generation family history.  Significant diagnoses are listed below: ?Family History  ?Problem Relation Age of Onset  ? Heart disease Mother   ? Diabetes  Mother   ? Melanoma Father   ? Heart  disease Father   ? Arthritis Father   ? Heart disease Sister   ? Skin cancer Brother   ?     patient unsure of what type of skin cancer brother had  ? Colon polyps Brother   ? Colon cancer Maternal Grandmother 51  ? Cancer Maternal Aunt 65  ?     sinus cancer  ? Esophageal cancer Neg Hx   ? Rectal cancer Neg Hx   ? Stomach cancer Neg Hx   ? Breast cancer Neg Hx   ? ? ?    ?  ?  ? ? ?Jacqueline Ortiz's brother was diagnosed with an unknown type of skin cancer. Her maternal aunt was diagnosed with sinus cancer at age 7, she is deceased. Her maternal grandmother was diagnosed with colon cancer at age 10, she is deceased. Her father was diagnosed with melanoma, he died at age 34. ?  ?Jacqueline Ortiz is unaware of previous family history of genetic testing for hereditary cancer risks. There is no reported Ashkenazi Jewish ancestry.  ?  ?GENETIC TEST RESULTS:  ?The [employer] CancerNext Panel found no pathogenic mutations.  ? ?The CancerNext gene panel offered by [employer] includes sequencing, rearrangement analysis, and RNA analysis for the following 36 genes:   APC, ATM, AXIN2, BARD1, BMPR1A, BRCA1, BRCA2, BRIP1, CDH1, CDK4, CDKN2A, CHEK2, DICER1, HOXB13, EPCAM, GREM1, MLH1, MSH2, MSH3, MSH6, MUTYH, NBN, NF1, NTHL1, PALB2, PMS2, POLD1, POLE, PTEN, RAD51C, RAD51D, RECQL, SMAD4, SMARCA4, STK11, and TP53.   ? ?The test report has been scanned into EPIC and is located under the Molecular Pathology section of the Results Review tab.  A portion of the result report is included below for reference. Genetic testing reported out on 04/08/2021.  ? ? ? ? ? ?Even though a pathogenic variant was not identified, possible explanations for her personal history of cancer may include: ?There may be no hereditary risk for cancer in the family. The cancers in Ms. Marolf and/or her family may be due to other genetic or environmental factors. ?There may be a gene mutation in one of these genes that current testing methods cannot detect, but that chance is  small. ?There could be another gene that has not yet been discovered, or that we have not yet tested, that is responsible for the cancer diagnoses in the family.  ? ?Therefore, it is important to remain in touch with cancer genetics in the future so that we can continue to offer Ms. Avis the most up to date genetic testing.  ? ?ADDITIONAL GENETIC TESTING:  ?We discussed with Ms. Faiella that her genetic testing was fairly extensive.  If there are genes identified to increase cancer risk that can be analyzed in the future, we would be happy to discuss and coordinate this testing at that time.   ? ?CANCER SCREENING RECOMMENDATIONS:  ?Ms. Findling's test result is considered negative (normal).  This means that we have not identified a hereditary cause for her personal and family history of cancer at this time. Most cancers happen by chance and this negative test suggests that her cancer may fall into this category.   ? ?An individual's cancer risk and medical management are not determined by genetic test results alone. Overall cancer risk assessment incorporates additional factors, including personal medical history, family history, and any available genetic information that may result in a personalized plan for cancer prevention and surveillance. Therefore, it is recommended she continue  to follow the cancer management and screening guidelines provided by her oncology and primary healthcare provider. ? ?RECOMMENDATIONS FOR FAMILY MEMBERS:   ?Since she did not inherit a mutation in a cancer predisposition gene included on this panel, her daughter could not have inherited a mutation from her in one of these genes. ?Individuals in this family might be at some increased risk of developing cancer, over the general population risk, due to the family history of cancer. We recommend women in this family have a yearly mammogram beginning at age 73, or 71 years younger than the earliest onset of cancer, an annual clinical breast  exam, and perform monthly breast self-exams.  ? ?FOLLOW-UP:  ?Cancer genetics is a rapidly advancing field and it is possible that new genetic tests will be appropriate for her and/or her family members in the

## 2021-04-16 ENCOUNTER — Other Ambulatory Visit: Payer: Medicare Other

## 2021-04-18 ENCOUNTER — Other Ambulatory Visit: Payer: Self-pay

## 2021-04-18 ENCOUNTER — Ambulatory Visit
Admission: RE | Admit: 2021-04-18 | Discharge: 2021-04-18 | Disposition: A | Discharge status: Home / Self Care | Payer: Medicare Other | Source: Ambulatory Visit | Attending: Surgery | Admitting: Surgery

## 2021-04-18 DIAGNOSIS — R928 Other abnormal and inconclusive findings on diagnostic imaging of breast: Secondary | ICD-10-CM

## 2021-04-18 DIAGNOSIS — D0511 Intraductal carcinoma in situ of right breast: Secondary | ICD-10-CM

## 2021-04-18 HISTORY — PX: BREAST BIOPSY: SHX20

## 2021-04-18 MED ORDER — GADOBUTROL 1 MMOL/ML IV SOLN
5.0000 mL | Freq: Once | INTRAVENOUS | Status: AC | PRN
  Administered 2021-04-18: 5 mL via INTRAVENOUS

## 2021-04-23 ENCOUNTER — Ambulatory Visit: Payer: Self-pay | Admitting: Surgery

## 2021-04-23 ENCOUNTER — Encounter: Payer: Self-pay | Admitting: *Deleted

## 2021-04-23 ENCOUNTER — Other Ambulatory Visit: Payer: Self-pay | Admitting: Surgery

## 2021-04-23 DIAGNOSIS — D0511 Intraductal carcinoma in situ of right breast: Secondary | ICD-10-CM

## 2021-04-23 DIAGNOSIS — C50911 Malignant neoplasm of unspecified site of right female breast: Secondary | ICD-10-CM

## 2021-04-23 NOTE — Pre-Procedure Instructions (Signed)
Surgical Instructions ? ? ? Your procedure is scheduled on Wednesday, April 5th. ? Report to [facility] Main Entrance "A" at 6:30 A.M., then check in with the Admitting office. ? Call this number if you have problems the morning of surgery: ? [phone] ? ? If you have any questions prior to your surgery date call [phone]: Open Monday-Friday 8am-4pm ? ? ? Remember: ? Do not eat after midnight the night before your surgery ? ?You may drink clear liquids until 5:30 a.m. the morning of your surgery.   ?Clear liquids allowed are: Water, Non-Citrus Juices (without pulp), Carbonated Beverages, Clear Tea, Black Coffee ONLY (NO MILK, CREAM OR POWDERED CREAMER of any kind), and Gatorade ?  ? Take these medicines the morning of surgery with A SIP OF WATER:  ? ?acetaminophen (TYLENOL)  ?atorvastatin (LIPITOR)  ?loratadine (CLARITIN)  ?Eye drops ? ?As of today, STOP taking any Aspirin (unless otherwise instructed by your surgeon) Aleve, Naproxen, Ibuprofen, Motrin, Advil, Goody's, BC's, all herbal medications, fish oil, and all vitamins. ? ?         ?Do not wear jewelry or makeup ?Do not wear lotions, powders, perfumes, or deodorant. ?Do not shave 48 hours prior to surgery.  ?Do not bring valuables to the hospital. ?Do not wear nail polish, gel polish, artificial nails, or any other type of covering on natural nails (fingers and toes) ?If you have artificial nails or gel coating that need to be removed by a nail salon, please have this removed prior to surgery. Artificial nails or gel coating may interfere with anesthesia's ability to adequately monitor your vital signs. ? ?[facility] is not responsible for any belongings or valuables. .  ? ?Do NOT Smoke (Tobacco/Vaping)  24 hours prior to your procedure ? ?If you use a CPAP at night, you may bring your mask for your overnight stay. ?  ?Contacts, glasses, hearing aids, dentures or partials may not be worn into surgery, please bring cases for these belongings ?  ?For  patients admitted to the hospital, discharge time will be determined by your treatment team. ?  ?Patients discharged the day of surgery will not be allowed to drive home, and someone needs to stay with them for 24 hours. ? ? ?SURGICAL WAITING ROOM VISITATION ?Patients having surgery or a procedure in a hospital may have two support people. ?Children under the age of 25 must have an adult with them who is not the patient. ?They may stay in the waiting area during the procedure and may switch out with other visitors. If the patient needs to stay at the hospital during part of their recovery, the visitor guidelines for inpatient rooms apply. ? ?Please refer to the [facility] website for the visitor guidelines for Inpatients (after your surgery is over and you are in a regular room).  ? ? ? ? ? ?Special instructions:   ? ?Oral Hygiene is also important to reduce your risk of infection.  Remember - BRUSH YOUR TEETH THE MORNING OF SURGERY WITH YOUR REGULAR TOOTHPASTE ? ? ?[facility]- Preparing For Surgery ? ?Before surgery, you can play an important role. Because skin is not sterile, your skin needs to be as free of germs as possible. You can reduce the number of germs on your skin by washing with CHG (chlorahexidine gluconate) Soap before surgery.  CHG is an antiseptic cleaner which kills germs and bonds with the skin to continue killing germs even after washing.   ? ? ?Please do not use  if you have an allergy to CHG or antibacterial soaps. If your skin becomes reddened/irritated stop using the CHG.  ?Do not shave (including legs and underarms) for at least 48 hours prior to first CHG shower. It is OK to shave your face. ? ?Please follow these instructions carefully. ?  ? ? Shower the NIGHT BEFORE SURGERY and the MORNING OF SURGERY with CHG Soap.  ? If you chose to wash your hair, wash your hair first as usual with your normal shampoo. After you shampoo, rinse your hair and body thoroughly to remove the shampoo.  Then  [employer] and genitals (private parts) with your normal soap and rinse thoroughly to remove soap. ? ?After that Use CHG Soap as you would any other liquid soap. You can apply CHG directly to the skin and wash gently with a scrungie or a clean washcloth.  ? ?Apply the CHG Soap to your body ONLY FROM THE NECK DOWN.  Do not use on open wounds or open sores. Avoid contact with your eyes, ears, mouth and genitals (private parts). Wash Face and genitals (private parts)  with your normal soap.  ? ?Wash thoroughly, paying special attention to the area where your surgery will be performed. ? ?Thoroughly rinse your body with warm water from the neck down. ? ?DO NOT shower/wash with your normal soap after using and rinsing off the CHG Soap. ? ?Pat yourself dry with a CLEAN TOWEL. ? ?Wear CLEAN PAJAMAS to bed the night before surgery ? ?Place CLEAN SHEETS on your bed the night before your surgery ? ?DO NOT SLEEP WITH PETS. ? ? ?Day of Surgery: ? ?Take a shower with CHG soap. ?Wear Clean/Comfortable clothing the morning of surgery ?Do not apply any deodorants/lotions.   ?Remember to brush your teeth WITH YOUR REGULAR TOOTHPASTE. ? ? ? ?If you received a COVID test during your pre-op visit  it is requested that you wear a mask when out in public, stay away from anyone that may not be feeling well and notify your surgeon if you develop symptoms. If you have been in contact with anyone that has tested positive in the last 10 days please notify you surgeon. ? ?  ?Please read over the following fact sheets that you were given.  ? ?

## 2021-04-24 ENCOUNTER — Encounter: Payer: Self-pay | Admitting: *Deleted

## 2021-04-24 ENCOUNTER — Encounter (HOSPITAL_COMMUNITY): Payer: Self-pay

## 2021-04-24 ENCOUNTER — Other Ambulatory Visit: Payer: Self-pay

## 2021-04-24 ENCOUNTER — Encounter (HOSPITAL_COMMUNITY)
Admission: RE | Admit: 2021-04-24 | Discharge: 2021-04-24 | Disposition: A | Discharge status: Home / Self Care | Payer: Medicare Other | Source: Ambulatory Visit | Attending: Surgery | Admitting: Surgery

## 2021-04-24 VITALS — BP 123/73 | HR 83 | Temp 97.7°F | Resp 17 | Ht 63.0 in | Wt 128.0 lb

## 2021-04-24 DIAGNOSIS — Z79899 Other long term (current) drug therapy: Secondary | ICD-10-CM | POA: Diagnosis not present

## 2021-04-24 DIAGNOSIS — F410 Panic disorder [episodic paroxysmal anxiety] without agoraphobia: Secondary | ICD-10-CM | POA: Diagnosis not present

## 2021-04-24 DIAGNOSIS — Z01818 Encounter for other preprocedural examination: Secondary | ICD-10-CM | POA: Insufficient documentation

## 2021-04-24 DIAGNOSIS — I10 Essential (primary) hypertension: Secondary | ICD-10-CM | POA: Insufficient documentation

## 2021-04-24 DIAGNOSIS — E785 Hyperlipidemia, unspecified: Secondary | ICD-10-CM | POA: Insufficient documentation

## 2021-04-24 DIAGNOSIS — D0511 Intraductal carcinoma in situ of right breast: Secondary | ICD-10-CM | POA: Diagnosis not present

## 2021-04-24 HISTORY — DX: Personal history of other diseases of the digestive system: Z87.19

## 2021-04-24 HISTORY — DX: Malignant (primary) neoplasm, unspecified: C80.1

## 2021-04-24 LAB — CBC
HCT: 41.6 % (ref 36.0–46.0)
Hemoglobin: 13.8 g/dL (ref 12.0–15.0)
MCH: 30 pg (ref 26.0–34.0)
MCHC: 33.2 g/dL (ref 30.0–36.0)
MCV: 90.4 fL (ref 80.0–100.0)
Platelets: 233 10*3/uL (ref 150–400)
RBC: 4.6 MIL/uL (ref 3.87–5.11)
RDW: 14.4 % (ref 11.5–15.5)
WBC: 6.3 10*3/uL (ref 4.0–10.5)
nRBC: 0 % (ref 0.0–0.2)

## 2021-04-24 LAB — BASIC METABOLIC PANEL
Anion gap: 6 (ref 5–15)
BUN: 6 mg/dL — ABNORMAL LOW (ref 8–23)
CO2: 33 mmol/L — ABNORMAL HIGH (ref 22–32)
Calcium: 9.8 mg/dL (ref 8.9–10.3)
Chloride: 100 mmol/L (ref 98–111)
Creatinine, Ser: 0.84 mg/dL (ref 0.44–1.00)
GFR, Estimated: >60 mL/min (ref >60)
Glucose, Bld: 110 mg/dL — ABNORMAL HIGH (ref 70–99)
Potassium: 3.3 mmol/L — ABNORMAL LOW (ref 3.5–5.1)
Sodium: 139 mmol/L (ref 135–145)

## 2021-04-24 NOTE — Progress Notes (Signed)
PCP - Lesly Dukes ?Cardiologist - none ? ?PPM/ICD - denies ?Device Orders -  ?Rep Notified -  ? ?Chest x-ray - n/a ?EKG - 04/24/21 ?Stress Test -none  ?ECHO - none ?Cardiac Cath - none  ? ?Sleep Study -  ?CPAP - no ? ?Fasting Blood Sugar - n/a ?Checks Blood Sugar _____ times a day ? ?Blood Thinner Instructions:n/a ?Aspirin Instructions:n/a ? ?ERAS Protcol - clear liquids until 0530 ?PRE-SURGERY Ensure or G2- no ? ?COVID TEST- n/a ? ? ?Anesthesia review: yes-seed patient ? ?Patient denies shortness of breath, fever, cough and chest pain at PAT appointment ? ? ?All instructions explained to the patient, with a verbal understanding of the material. Patient agrees to go over the instructions while at home for a better understanding. Patient also instructed to wear a mask when out in public prior to surgery. The opportunity to ask questions was provided. ?  ?

## 2021-04-25 NOTE — Anesthesia Preprocedure Evaluation (Addendum)
Anesthesia Evaluation  ?Patient identified by MRN, date of birth, ID band ?Patient awake ? ? ? ?Reviewed: ?Allergy & Precautions, NPO status , Patient's Chart, lab work & pertinent test results ? ?Airway ?Mallampati: III ? ?TM Distance: >3 FB ?Neck ROM: Full ? ? ? Dental ? ?(+) Teeth Intact, Dental Advisory Given, Caps ?  ?Pulmonary ?neg pulmonary ROS,  ?  ?Pulmonary exam normal ?breath sounds clear to auscultation ? ? ? ? ? ? Cardiovascular ?hypertension, Normal cardiovascular exam ?Rhythm:Regular Rate:Normal ? ? ?  ?Neuro/Psych ?PSYCHIATRIC DISORDERS Anxiety Depression negative neurological ROS ?   ? GI/Hepatic ?Neg liver ROS, hiatal hernia, GERD  ,  ?Endo/Other  ?negative endocrine ROS ? Renal/GU ?negative Renal ROS  ?negative genitourinary ?  ?Musculoskeletal ? ?(+) Arthritis ,  ? Abdominal ?  ?Peds ? Hematology ?negative hematology ROS ?(+)   ?Anesthesia Other Findings ?vocal cord paralysis (1993, history of surgery ? Reproductive/Obstetrics ? ?  ? ? ? ? ? ? ? ? ? ? ? ? ? ?  ?  ? ? ? ? ? ? ?Anesthesia Physical ?Anesthesia Plan ? ?ASA: 2 ? ?Anesthesia Plan: General and Regional  ? ?Post-op Pain Management: Regional block*, Tylenol PO (pre-op)* and Toradol IV (intra-op)*  ? ?Induction: Intravenous ? ?PONV Risk Score and Plan: 3 and Ondansetron, Dexamethasone and Treatment may vary due to age or medical condition ? ?Airway Management Planned: LMA ? ?Additional Equipment:  ? ?Intra-op Plan:  ? ?Post-operative Plan: Extubation in OR ? ?Informed Consent: I have reviewed the patients History and Physical, chart, labs and discussed the procedure including the risks, benefits and alternatives for the proposed anesthesia with the patient or authorized representative who has indicated his/her understanding and acceptance.  ? ? ? ?Dental advisory given ? ?Plan Discussed with: CRNA ? ?Anesthesia Plan Comments: (PAT note written 04/25/2021 by Myra Gianotti, PA-C. ?)  ? ? ? ? ? ?Anesthesia  Quick Evaluation ? ?

## 2021-04-25 NOTE — Progress Notes (Signed)
Anesthesia Chart Review: ? Case: [id number] Date/Time: 05/01/21 0815  ? Procedures:  ?    RIGHT BREAST LUMPECTOMY WITH RADIOACTIVE SEED LOCALIZATION X4 (Right: Breast) ?    SENTINEL NODE BIOPSY  ? Anesthesia type: General  ? Pre-op diagnosis: RIGHT BREAST DCIS  ? Location: [facility] OR ROOM 01 / [facility]  ? Surgeons: Erroll Luna, MD  ? ?  ? ? ?DISCUSSION: Patient is a 76 year old female scheduled for the above procedure. ? 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  ? ?RSL is scheduled for 04/30/2021 at 9 AM.  Anesthesia team to evaluate on the day of surgery. ? ? ?VS: BP 123/73   Pulse 83   Temp 36.5 ?C (Oral)   Resp 17   Ht '5\' 3"'$  (1.6 m)   Wt 58.1 kg   SpO2 100%   BMI 22.67 kg/m?  ? ?PROVIDERS: ?Burnard Bunting, MD is PCP  ?Kyung Rudd, MD is RAD-ONC ?Truitt Merle, MD is HEM-ONC ? ? ?LABS: Labs reviewed: Acceptable for surgery. ?(all labs ordered are listed, but only abnormal results are displayed) ? ?Labs Reviewed  ?BASIC METABOLIC PANEL - Abnormal; Notable for the following components:  ?    Result Value  ? Potassium 3.3 (*)   ? CO2 33 (*)   ? Glucose, Bld 110 (*)   ? BUN 6 (*)   ? All other components within normal limits  ?CBC  ? ? ?EKG: 04/24/21: NSR ? ? ?CV: N/A ? ? ?Past Medical History:  ?Diagnosis Date  ? Allergic rhinosinusitis 2008  ? usual daily sinus drainage and post nasal drip  ? Arthritis 2023  ? osteoarthritis  ? Cancer [facility])   ? Depression   ? patient denies depression/ states has "anxiety" most of her life, unsure date it started  ? Diverticulosis   ? patient unsure of date of diagnosis  ? Dry eyes, bilateral 2000  ? Duodenal diverticulum   ? patient unsure of diagnosis date  ? Dyslipidemia 2008  ? Eczema 1993  ? Esophageal stricture 2008  ? GERD  (gastroesophageal reflux disease) 2008  ? remains an issue   ? Hemorrhoids 2003  ? internal and external- remains- treats as needed with topical gels  ? History of hiatal hernia   ? Hyperlipidemia 2008  ? Hypertension 2021  ? Nephrolithiasis 2017  ? Osteoporosis 2003  ? Panic disorder   ? patient reports anxiety most of her life  ? Paralyzed vocal cords 1993  ? 30 years ago/ per pt unsure of cause" speaks in soft whispery tone is normal"  ? Scoliosis deformity of spine   ? very severe"can't lay flat with out cushion"- started as a young teenager  ? Tubular adenoma of colon 09/06/2004  ? Dr. Silvano Rusk  ? ? ?Past Surgical History:  ?Procedure Laterality Date  ? BREAST BIOPSY Right 03/15/2021  ? BREAST BIOPSY Right 04/18/2021  ? CESAREAN SECTION    ? COLONOSCOPY  2022  ? Dr. Silvano Rusk  ? DILATION AND CURETTAGE OF UTERUS    ? ESOPHAGOGASTRODUODENOSCOPY  02/02/2003  ? Dr. Silvano Rusk  ? ESOPHAGOGASTRODUODENOSCOPY N/A 10/21/2015  ? Procedure: ESOPHAGOGASTRODUODENOSCOPY (EGD);  Surgeon: Milus Banister, MD;  Location: [facility];  Service: Endoscopy;  Laterality: N/A;  ? HIATAL HERNIA REPAIR    ? KNEE SURGERY Right   ?  right fx- retained hardware  ? NISSEN FUNDOPLICATION  3014  ? TONSILLECTOMY  31  ? age 65  ? TUBAL LIGATION    ? UPPER GASTROINTESTINAL ENDOSCOPY    ? vocal cord surgery    ? "did improve condition"  ? ? ?MEDICATIONS: ? acetaminophen (TYLENOL) 325 MG tablet  ? atorvastatin (LIPITOR) 20 MG tablet  ? Calcium-Magnesium-Vitamin D [id number] MG-MG-UNIT CHEW  ? escitalopram (LEXAPRO) 10 MG tablet  ? Ketotifen Fumarate (REFRESH EYE ITCH RELIEF OP)  ? loratadine (CLARITIN) 10 MG tablet  ? Phenylephrine-[place]el (PREPARATION H) 0.25-50 % GEL  ? triamterene-hydrochlorothiazide (MAXZIDE-25) 37.5-25 MG tablet  ? ?No current facility-administered medications for this encounter.  ? ? ?Myra Gianotti, PA-C ?Surgical Short Stay/Anesthesiology ?[facility] Phone [phone] ?[facility] Phone [phone] ?04/25/2021 2:14  PM ? ? ? ? ? ? ?

## 2021-04-30 ENCOUNTER — Ambulatory Visit
Admission: RE | Admit: 2021-04-30 | Discharge: 2021-04-30 | Disposition: A | Discharge status: Home / Self Care | Payer: Medicare Other | Source: Ambulatory Visit | Attending: Surgery | Admitting: Surgery

## 2021-04-30 DIAGNOSIS — D0511 Intraductal carcinoma in situ of right breast: Secondary | ICD-10-CM

## 2021-04-30 DIAGNOSIS — C50911 Malignant neoplasm of unspecified site of right female breast: Secondary | ICD-10-CM

## 2021-05-01 ENCOUNTER — Encounter (HOSPITAL_COMMUNITY)
Admission: RE | Disposition: A | Discharge status: Home / Self Care | Payer: Self-pay | Source: Home / Self Care | Attending: Surgery

## 2021-05-01 ENCOUNTER — Ambulatory Visit
Admission: RE | Admit: 2021-05-01 | Discharge: 2021-05-01 | Disposition: A | Discharge status: Home / Self Care | Payer: Medicare Other | Source: Ambulatory Visit | Attending: Surgery | Admitting: Surgery

## 2021-05-01 ENCOUNTER — Ambulatory Visit (HOSPITAL_BASED_OUTPATIENT_CLINIC_OR_DEPARTMENT_OTHER): Payer: Medicare Other | Admitting: Anesthesiology

## 2021-05-01 ENCOUNTER — Ambulatory Visit (HOSPITAL_COMMUNITY)
Admission: RE | Admit: 2021-05-01 | Discharge: 2021-05-01 | Disposition: A | Discharge status: Home / Self Care | Payer: Medicare Other | Attending: Surgery | Admitting: Surgery

## 2021-05-01 ENCOUNTER — Encounter (HOSPITAL_COMMUNITY): Payer: Self-pay | Admitting: Surgery

## 2021-05-01 ENCOUNTER — Other Ambulatory Visit: Payer: Self-pay

## 2021-05-01 ENCOUNTER — Ambulatory Visit (HOSPITAL_COMMUNITY): Payer: Medicare Other | Admitting: Vascular Surgery

## 2021-05-01 DIAGNOSIS — K219 Gastro-esophageal reflux disease without esophagitis: Secondary | ICD-10-CM | POA: Diagnosis not present

## 2021-05-01 DIAGNOSIS — F419 Anxiety disorder, unspecified: Secondary | ICD-10-CM | POA: Diagnosis not present

## 2021-05-01 DIAGNOSIS — C50911 Malignant neoplasm of unspecified site of right female breast: Secondary | ICD-10-CM

## 2021-05-01 DIAGNOSIS — F32A Depression, unspecified: Secondary | ICD-10-CM | POA: Diagnosis not present

## 2021-05-01 DIAGNOSIS — I10 Essential (primary) hypertension: Secondary | ICD-10-CM | POA: Diagnosis not present

## 2021-05-01 DIAGNOSIS — Z170 Estrogen receptor positive status [ER+]: Secondary | ICD-10-CM | POA: Diagnosis not present

## 2021-05-01 DIAGNOSIS — D0511 Intraductal carcinoma in situ of right breast: Secondary | ICD-10-CM

## 2021-05-01 DIAGNOSIS — C50811 Malignant neoplasm of overlapping sites of right female breast: Secondary | ICD-10-CM

## 2021-05-01 DIAGNOSIS — I898 Other specified noninfective disorders of lymphatic vessels and lymph nodes: Secondary | ICD-10-CM | POA: Diagnosis not present

## 2021-05-01 HISTORY — PX: BREAST LUMPECTOMY WITH RADIOACTIVE SEED LOCALIZATION: SHX6424

## 2021-05-01 HISTORY — PX: SENTINEL NODE BIOPSY: SHX6608

## 2021-05-01 SURGERY — BREAST LUMPECTOMY WITH RADIOACTIVE SEED LOCALIZATION
Anesthesia: Regional | Site: Breast | Laterality: Right

## 2021-05-01 MED ORDER — PROPOFOL 10 MG/ML IV BOLUS
INTRAVENOUS | Status: AC
  Filled 2021-05-01: qty 20

## 2021-05-01 MED ORDER — LACTATED RINGERS IV SOLN: INTRAVENOUS | Status: DC

## 2021-05-01 MED ORDER — FENTANYL CITRATE (PF) 250 MCG/5ML IJ SOLN
INTRAMUSCULAR | Status: AC
  Filled 2021-05-01: qty 5

## 2021-05-01 MED ORDER — PHENYLEPHRINE 40 MCG/ML (10ML) SYRINGE FOR IV PUSH (FOR BLOOD PRESSURE SUPPORT)
PREFILLED_SYRINGE | INTRAVENOUS | Status: AC
  Filled 2021-05-01: qty 40

## 2021-05-01 MED ORDER — LIDOCAINE 2% (20 MG/ML) 5 ML SYRINGE
INTRAMUSCULAR | Status: DC | PRN
Start: 2021-05-01 — End: 2021-05-01
  Administered 2021-05-01: 20 mg via INTRAVENOUS

## 2021-05-01 MED ORDER — DEXAMETHASONE SODIUM PHOSPHATE 10 MG/ML IJ SOLN
INTRAMUSCULAR | Status: AC
  Filled 2021-05-01: qty 1

## 2021-05-01 MED ORDER — FENTANYL CITRATE (PF) 100 MCG/2ML IJ SOLN: 25.0000 ug | INTRAMUSCULAR | Status: DC | PRN

## 2021-05-01 MED ORDER — CEFAZOLIN SODIUM-DEXTROSE 2-4 GM/100ML-% IV SOLN
2.0000 g | INTRAVENOUS | Status: DC
  Filled 2021-05-01: qty 100

## 2021-05-01 MED ORDER — SUGAMMADEX SODIUM 500 MG/5ML IV SOLN
INTRAVENOUS | Status: AC
  Filled 2021-05-01: qty 5

## 2021-05-01 MED ORDER — ORAL CARE MOUTH RINSE: 15.0000 mL | Freq: Once | OROMUCOSAL | Status: AC

## 2021-05-01 MED ORDER — BUPIVACAINE-EPINEPHRINE 0.25% -1:200000 IJ SOLN
INTRAMUSCULAR | Status: DC | PRN
  Administered 2021-05-01: 30 mL

## 2021-05-01 MED ORDER — MIDAZOLAM HCL 2 MG/2ML IJ SOLN
INTRAMUSCULAR | Status: DC | PRN
  Administered 2021-05-01: .5 mg via INTRAVENOUS

## 2021-05-01 MED ORDER — MAGTRACE LYMPHATIC TRACER
INTRAMUSCULAR | Status: DC | PRN
  Administered 2021-05-01: 2 mL via INTRAMUSCULAR

## 2021-05-01 MED ORDER — 0.9 % SODIUM CHLORIDE (POUR BTL) OPTIME
TOPICAL | Status: DC | PRN
  Administered 2021-05-01: 1000 mL

## 2021-05-01 MED ORDER — PROPOFOL 10 MG/ML IV BOLUS
INTRAVENOUS | Status: DC | PRN
  Administered 2021-05-01: 120 mg via INTRAVENOUS

## 2021-05-01 MED ORDER — CHLORHEXIDINE GLUCONATE CLOTH 2 % EX PADS: 6.0000 | MEDICATED_PAD | Freq: Once | CUTANEOUS | Status: DC

## 2021-05-01 MED ORDER — FENTANYL CITRATE (PF) 250 MCG/5ML IJ SOLN
INTRAMUSCULAR | Status: DC | PRN
  Administered 2021-05-01: 25 ug via INTRAVENOUS
  Administered 2021-05-01: 50 ug via INTRAVENOUS

## 2021-05-01 MED ORDER — ACETAMINOPHEN 500 MG PO TABS
1000.0000 mg | ORAL_TABLET | Freq: Once | ORAL | Status: AC
  Administered 2021-05-01: 1000 mg via ORAL
  Filled 2021-05-01: qty 2

## 2021-05-01 MED ORDER — ROPIVACAINE HCL 5 MG/ML IJ SOLN
INTRAMUSCULAR | Status: DC | PRN
  Administered 2021-05-01: 30 mL via PERINEURAL

## 2021-05-01 MED ORDER — DEXAMETHASONE SODIUM PHOSPHATE 10 MG/ML IJ SOLN
INTRAMUSCULAR | Status: DC | PRN
Start: 2021-05-01 — End: 2021-05-01
  Administered 2021-05-01: 5 mg via INTRAVENOUS

## 2021-05-01 MED ORDER — ONDANSETRON HCL 4 MG/2ML IJ SOLN
INTRAMUSCULAR | Status: AC
  Filled 2021-05-01: qty 2

## 2021-05-01 MED ORDER — CHLORHEXIDINE GLUCONATE 0.12 % MT SOLN
15.0000 mL | Freq: Once | OROMUCOSAL | Status: AC
  Administered 2021-05-01: 15 mL via OROMUCOSAL
  Filled 2021-05-01: qty 15

## 2021-05-01 MED ORDER — BUPIVACAINE-EPINEPHRINE (PF) 0.25% -1:200000 IJ SOLN
INTRAMUSCULAR | Status: AC
  Filled 2021-05-01: qty 30

## 2021-05-01 MED ORDER — OXYCODONE HCL 5 MG PO TABS: 5.0000 mg | ORAL_TABLET | Freq: Four times a day (QID) | ORAL | 0 refills | Status: DC | PRN

## 2021-05-01 MED ORDER — DEXAMETHASONE SODIUM PHOSPHATE 10 MG/ML IJ SOLN
INTRAMUSCULAR | Status: DC | PRN
  Administered 2021-05-01: 5 mg

## 2021-05-01 MED ORDER — MIDAZOLAM HCL 2 MG/2ML IJ SOLN
INTRAMUSCULAR | Status: AC
  Filled 2021-05-01: qty 2

## 2021-05-01 MED ORDER — PHENYLEPHRINE 40 MCG/ML (10ML) SYRINGE FOR IV PUSH (FOR BLOOD PRESSURE SUPPORT)
PREFILLED_SYRINGE | INTRAVENOUS | Status: DC | PRN
  Administered 2021-05-01: 160 ug via INTRAVENOUS
  Administered 2021-05-01: 80 ug via INTRAVENOUS
  Administered 2021-05-01: 120 ug via INTRAVENOUS

## 2021-05-01 MED ORDER — ONDANSETRON HCL 4 MG/2ML IJ SOLN
INTRAMUSCULAR | Status: DC | PRN
  Administered 2021-05-01: 4 mg via INTRAVENOUS

## 2021-05-01 SURGICAL SUPPLY — 46 items
ADH SKN CLS APL DERMABOND .7 (GAUZE/BANDAGES/DRESSINGS) ×1
APPLIER CLIP 9.375 MED OPEN (MISCELLANEOUS) ×3
BAG COUNTER SPONGE SURGICOUNT (BAG) ×3 IMPLANT
BINDER BREAST LRG (GAUZE/BANDAGES/DRESSINGS) ×1 IMPLANT
BINDER BREAST XLRG (GAUZE/BANDAGES/DRESSINGS) IMPLANT
BIOPATCH RED 1 DISK 7.0 (GAUZE/BANDAGES/DRESSINGS) ×1 IMPLANT
CANISTER SUCT 3000ML PPV (MISCELLANEOUS) IMPLANT
CHLORAPREP W/TINT 26 (MISCELLANEOUS) ×3 IMPLANT
CLIP APPLIE 9.375 MED OPEN (MISCELLANEOUS) IMPLANT
COVER PROBE W GEL 5X96 (DRAPES) ×3 IMPLANT
COVER SURGICAL LIGHT HANDLE (MISCELLANEOUS) ×3 IMPLANT
DERMABOND ADVANCED (GAUZE/BANDAGES/DRESSINGS) ×1
DERMABOND ADVANCED .7 DNX12 (GAUZE/BANDAGES/DRESSINGS) ×2 IMPLANT
DEVICE DUBIN SPECIMEN MAMMOGRA (MISCELLANEOUS) ×3 IMPLANT
DRAIN CHANNEL 19F RND (DRAIN) ×1 IMPLANT
DRAPE CHEST BREAST 15X10 FENES (DRAPES) ×3 IMPLANT
DRSG TEGADERM 4X4.75 (GAUZE/BANDAGES/DRESSINGS) ×1 IMPLANT
ELECT CAUTERY BLADE 6.4 (BLADE) ×3 IMPLANT
ELECT REM PT RETURN 9FT ADLT (ELECTROSURGICAL) ×3
ELECTRODE REM PT RTRN 9FT ADLT (ELECTROSURGICAL) ×2 IMPLANT
EVACUATOR SILICONE 100CC (DRAIN) ×1 IMPLANT
GLOVE SRG 8 PF TXTR STRL LF DI (GLOVE) ×2 IMPLANT
GLOVE SURG ENC MOIS LTX SZ8 (GLOVE) ×3 IMPLANT
GLOVE SURG UNDER POLY LF SZ8 (GLOVE) ×3
GOWN STRL REUS W/ TWL LRG LVL3 (GOWN DISPOSABLE) ×2 IMPLANT
GOWN STRL REUS W/ TWL XL LVL3 (GOWN DISPOSABLE) ×2 IMPLANT
GOWN STRL REUS W/TWL LRG LVL3 (GOWN DISPOSABLE) ×3
GOWN STRL REUS W/TWL XL LVL3 (GOWN DISPOSABLE) ×3
KIT BASIN OR (CUSTOM PROCEDURE TRAY) ×3 IMPLANT
KIT MARKER MARGIN INK (KITS) ×1 IMPLANT
LIGHT WAVEGUIDE WIDE FLAT (MISCELLANEOUS) ×1 IMPLANT
NDL HYPO 25GX1X1/2 BEV (NEEDLE) IMPLANT
NEEDLE HYPO 25GX1X1/2 BEV (NEEDLE) ×3 IMPLANT
NS IRRIG 1000ML POUR BTL (IV SOLUTION) IMPLANT
PACK GENERAL/GYN (CUSTOM PROCEDURE TRAY) ×3 IMPLANT
SUT ETHILON 2 0 FS 18 (SUTURE) ×1 IMPLANT
SUT MNCRL AB 4-0 PS2 18 (SUTURE) ×4 IMPLANT
SUT SILK 2 0 SH (SUTURE) IMPLANT
SUT VIC AB 2-0 SH 27 (SUTURE) ×3
SUT VIC AB 2-0 SH 27XBRD (SUTURE) IMPLANT
SUT VIC AB 3-0 SH 27 (SUTURE)
SUT VIC AB 3-0 SH 27X BRD (SUTURE) IMPLANT
SUT VIC AB 3-0 SH 8-18 (SUTURE) ×4 IMPLANT
SYR CONTROL 10ML LL (SYRINGE) IMPLANT
TOWEL GREEN STERILE FF (TOWEL DISPOSABLE) IMPLANT
TRACER MAGTRACE VIAL (MISCELLANEOUS) ×1 IMPLANT

## 2021-05-01 NOTE — Anesthesia Procedure Notes (Signed)
Anesthesia Regional Block: Pectoralis block  ? ?Pre-Anesthetic Checklist: , timeout performed,  Correct Patient, Correct Site, Correct Laterality,  Correct Procedure, Correct Position, site marked,  Risks and benefits discussed,  Surgical consent,  Pre-op evaluation,  At surgeon's request and post-op pain management ? ?Laterality: Right ? ?Prep: Maximum Sterile Barrier Precautions used, chloraprep     ?  ?Needles:  ?Injection technique: Single-shot ? ?Needle Type: Echogenic Stimulator Needle   ? ? ?Needle Length: 9cm  ?Needle Gauge: 22  ? ? ? ?Additional Needles: ? ? ?Procedures:,,,, ultrasound used (permanent image in chart),,    ?Narrative:  ?Start time: 05/01/2021 8:00 AM ?End time: 05/01/2021 8:04 AM ?Injection made incrementally with aspirations every 5 mL. ? ?Performed by: Personally  ?Anesthesiologist: Freddrick March, MD ? ?Additional Notes: ?Monitors applied. No increased pain on injection. No increased resistance to injection. Injection made in 5cc increments. Good needle visualization. Patient tolerated procedure well.  ? ? ? ? ?

## 2021-05-01 NOTE — Interval H&P Note (Signed)
History and Physical Interval Note: ? ?05/01/2021 ?8:14 AM ? ?Jacqueline Ortiz  has presented today for surgery, with the diagnosis of RIGHT BREAST DCIS.  The various methods of treatment have been discussed with the patient and family. After consideration of risks, benefits and other options for treatment, the patient has consented to  Procedure(s): ?RIGHT BREAST LUMPECTOMY WITH RADIOACTIVE SEED LOCALIZATION X4 (Right) ?SENTINEL NODE BIOPSY (N/A) as a surgical intervention.  The patient's history has been reviewed, patient examined, no change in status, stable for surgery.  I have reviewed the patient's chart and labs.  Questions were answered to the patient's satisfaction.   ? ? ?Regino Fournet A Grete Bosko ? ? ?

## 2021-05-01 NOTE — Op Note (Signed)
Preoperative diagnosis: Right breast cancer stage I overlapping sites ? ?Postoperative diagnosis: Same ? ?Procedure: Right breast seed localized lumpectomy localizing overlapping sites with 4 seeds and right axillary sentinel lymph node mapping with mag trace ? ?Surgeon: Erroll Luna, MD ? ?Anesthesia: LMA with right pectoral block and 0.25% Marcaine with epinephrine ? ?EBL: Minimal ? ?Specimen: Right breast medial lumpectomy verified by seed and clip, right lateral lumpectomy with 3 additional seeds and clips.  These were imaged by Faxitron and reviewed by radiology.  2 right axillary sentinel nodes ? ?Drains: 19 round ? ?IV fluids: Per anesthesia record ? ?Indications for procedure: The patient is a 76 year old female with right breast cancer/DCIS of overlapping sites.  We discussed treatment options of mastectomy reconstruction as well as breast conserving surgery.  She had not extensive disease but wish to pursue an opportunity of breast conserving surgery.  We discussed the pros and cons of this as well as cosmetic outcome which can be suboptimal.  She understood this but wished to an attempt this.  Risks and benefits of surgery discussed.  Complications of surgery as well as alternatives to surgery as well as mastectomy reconstruction were discussed.The procedure has been discussed with the patient. Alternatives to surgery have been discussed with the patient.  Risks of surgery include bleeding,  Infection,  Seroma formation, death,  and the need for further surgery.   The patient understands and wishes to proceed. Sentinel lymph node mapping and dissection has been discussed with the patient.  Risk of bleeding,  Infection,  Seroma formation, cosmesis, lymphedema additional procedures,,  Shoulder weakness ,  Shoulder stiffness,  Nerve and blood vessel injury and reaction to the mapping dyes have been discussed.  Alternatives to surgery have been discussed with the patient.  The patient agrees to proceed.   ? ? ? ?Description of procedure: The patient was met in the holding area and questions were answered.  Films were available for review.  The right breast was marked as the correct site.  Pectoral block placed by anesthesia.  All questions were answered.  She was taken back to the operating room.  She was placed supine upon the OR table.  After induction of general esthesia, right breast was prepped sterilely with alcohol and 2 cc of mag tracer injected in the subareolar position.  Massage was done for 5 minutes.  She was then prepped and draped in a sterile fashion a second time and timeout performed for second time.  Proper patient, site and procedure verified.  Neoprobe was used to identify all 4 seeds.  1 was medial and the 3 were lateral.  2 incisions were utilized.  A medial incision was utilized for the medial mass.  This was a transverse incision.  Dissection was carried down all tissue around the seed and clip were excised with a grossly negative margin.  Hemostasis was achieved. ? ?The right lumpectomy lateral mass was performed.  A curvilinear incision was made in the lateral aspect of the breast.  Dissection was carried down and all 3 seeds were localized and all tissue in between these markers were excised with grossly negative margins.  Both specimens were radiographed and all seeds and clips were accounted for.  Irrigation was used for both local anesthetic infiltrated throughout both.  The medial incision was closed with 3-0 Vicryl and 4 Monocryl.  The lateral wound was closed with 3-0 Vicryl and 4 Monocryl.  A drain was placed though due to mobilization of the skin to help  close the extensive defect from that lumpectomy.  This was placed through separate stab incision and tissue was closed over the drain to facilitate good decompression of the seroma cavity.  The drain was secured to skin with 2-0 nylon and placed to bulb suction. ? ?The mag trace probe was used.  Hotspot identified in the right  axilla.  A 4 cm incision was made dissection was carried down to the level 1 contents.  There were 2 hot nodes identified and removed.  Background counts approached baseline.  Hemostasis was achieved.  The long thoracic nerve, thoracodorsal trunk and axillary vein were preserved.  Irrigation was used.  Wound closed with a deep layer 3-0 Vicryl for Monocryl.  Breast binder placed.  All counts found to be correct.  The patient was awoke extubated taken recovery in satisfactory condition. ?

## 2021-05-01 NOTE — H&P (Signed)
Chief Complaint: No chief complaint on file. ? ? ?History of Present Illness: ?Jacqueline Ortiz is a 76 y.o. female who is seen today for follow-up after being seen in the [facility] for right breast DCIS. She underwent MRI which showed an area measuring close to 7 cm in her right breast upper outer quadrant. A third biopsy was done which showed invasive disease. Prognostic panel pending. There are 3 markers in the breast with 4 areas that were biopsied. This area seems to measure about a centimeter but this is a backdrop of DCIS. She returns today to discuss surgical options. I discussed the area is quite large and she may be better served with a mastectomy. Of note her right breast is much larger than her left. Certainly she is very motivated to try to do a lumpectomy here with sentinel lymph node mapping which we discussed since she now has invasive disease not just DCIS. She is very motivated to keep her breast and released to make an attempt to conserve it. I discussed potential size discrepancy but she is larger on the right than left and this may actually counterbalance that somewhat. I discussed potential cosmesis of this as well.. ? ? ? ?Review of Systems: ?A complete review of systems was obtained from the patient. I have reviewed this information and discussed as appropriate with the patient. See HPI as well for other ROS. ? ? ? ?Medical History: ?Past Medical History:  ?Diagnosis Date  ? Arthritis  ? GERD (gastroesophageal reflux disease)  ? Hyperlipidemia  ? Hypertension  ? ?Patient Active Problem List  ?Diagnosis  ? Ductal carcinoma in situ (DCIS) of right breast  ? Hyperlipidemia  ? ?Past Surgical History:  ?Procedure Laterality Date  ? CESAREAN SECTION N/A  ?Date Unknown  ? Right Knee Surgery Right  ?Date Unknown  ? ? ?No Known Allergies ? ?Current Outpatient Medications on File Prior to Visit  ?Medication Sig Dispense Refill  ? atorvastatin (LIPITOR) 20 MG tablet Take 1 tablet by mouth at bedtime  ?  escitalopram oxalate (LEXAPRO) 10 MG tablet Take 10 mg by mouth once daily  ? pantoprazole (PROTONIX) 40 MG DR tablet Take 40 mg by mouth 2 (two) times daily  ? triamterene-hydrochlorothiazide (MAXZIDE-25) 37.5-25 mg tablet Take 1 tablet by mouth every morning  ? ?No current facility-administered medications on file prior to visit.  ? ?Family History  ?Problem Relation Age of Onset  ? Coronary Artery Disease (Blocked arteries around heart) Mother  ? Diabetes Mother  ? Coronary Artery Disease (Blocked arteries around heart) Father  ? Coronary Artery Disease (Blocked arteries around heart) Sister  ? ? ?Social History  ? ?Tobacco Use  ?Smoking Status Never  ?Smokeless Tobacco Never  ? ? ?Social History  ? ?Socioeconomic History  ? Marital status: Married  ?Tobacco Use  ? Smoking status: Never  ? Smokeless tobacco: Never  ?Vaping Use  ? Vaping Use: Never used  ?Substance and Sexual Activity  ? Alcohol use: Not Currently  ? Drug use: Never  ? ?Objective:  ? ?There were no vitals filed for this visit.  ?There is no height or weight on file to calculate BMI. ? ?Physical Exam ?Neurological:  ?General: No focal deficit present.  ?Mental Status: She is alert.  ?Psychiatric:  ?Mood and Affect: Mood normal.  ?Behavior: Behavior normal.  ? ? ? ?Labs, Imaging and Diagnostic Testing: ?CLINICAL DATA: 76 year old female with recent diagnosis of DCIS and ?CSL in the right breast at 12 o'clock (ribbon clip) a  radial scar in ?the right breast at 3 o'clock (coil clip) and high-grade DCIS ?involving a fibroadenoma in the right breast at 930 (heart clip). ?  ?EXAM: ?BILATERAL BREAST MRI WITH AND WITHOUT CONTRAST ?  ?TECHNIQUE: ?Multiplanar, multisequence MR images of both breasts were obtained ?prior to and following the intravenous administration of 6 ml of ?Gadavist ?  ?Three-dimensional MR images were rendered by post-processing of the ?original MR data on an independent workstation. The ?three-dimensional MR images were interpreted,  and findings are ?reported in the following complete MRI report for this study. Three ?dimensional images were evaluated at the independent interpreting ?workstation using the DynaCAD thin client. ?  ?COMPARISON: No prior MRI available for comparison. Correlation made ?with prior mammogram and ultrasound images. ?  ?FINDINGS: ?Breast composition: c. Heterogeneous fibroglandular tissue. ?  ?Background parenchymal enhancement: Moderate. ?  ?Right breast: There is generally a moderate amount of nodular and ?clumped background parenchymal enhancement (in both breasts) which ?limits sensitivity of MRI. ?  ?In the upper-outer posterior right breast (series 6, image 61) there ?is a 1.5 cm area of linear non mass enhancement corresponding with a ?focus of susceptibility artifact likely corresponding to the heart ?shaped biopsy marking clip at the site of recently biopsied ?high-grade DCIS. ?  ?Approximately 1.5 cm inferior and just slightly anterior to the ?linear non mass enhancement, there is a 1 cm enhancing mass with ?mixed persistent and plateau kinetics in the upper-outer quadrant ?(series 6, image 73). This does not appear to be associated with a ?biopsy marking clip. ?  ?In the retroareolar right breast, there is distortion associated ?with patchy non mass enhancement spanning approximately 3.7 cm as ?measured in the craniocaudal view (series 6, image 69). This area is ?poorly defined and is difficult to measure given the appearance of ?the patient's background parenchymal enhancement. ?  ?All together, the known DCIS in the upper-outer quadrant of the ?right breast, the 1 cm mass in the upper-outer right breast, and the ?DCIS in the retroareolar right breast span at least 7.5 cm. ?  ?In the medial right breast at the site of the biopsy marking clip ?corresponding with the 3 o'clock axis by ultrasound, there is a 1.3 ?cm area of linear non mass enhancement. This corresponds with the ?radial scar. ?  ?Left breast: No  mass or abnormal enhancement. Two T2 bright ?subcentimeter benign cysts are seen in the central and inferior left ?breast. ?  ?Lymph nodes: No abnormal appearing lymph nodes. ?  ?Ancillary findings: None. ?  ?IMPRESSION: ?1. The nodular and clumped background parenchymal enhancement in ?both breast limits the sensitivity of this MRI. ?  ?2. There is a suspicious 1 cm mass in the upper-outer quadrant of ?the right breast which has not previously been biopsied. This is 1.5 ?cm inferior and anterior to the biopsy-proven DCIS in the ?upper-outer right breast. ?  ?3. The enhancement at the site of biopsy-proven DCIS in the ?upper-outer right breast spans 1.5 cm. ?  ?4. The enhancement at the site of biopsy-proven DCIS and CSL in the ?retroareolar right breast spans at least 3.7 cm. The span of tissue, ?including both sites of known DCIS and the 1 cm mass described in ?impression point #2, spans at least 7.5 cm. ?  ?5. No evidence of malignancy in the left breast. ?  ?RECOMMENDATION: ?Biopsy of the 1 cm mass in the upper-outer quadrant of the right ?breast may be performed if it will alter clinical management. ?  ?BI-RADS CATEGORY 6: Known  biopsy-proven malignancy. ?  ?  ?Electronically Signed ?By: Ammie Ferrier M.D. ?On: 04/03/2021 13:40 ?Diagnosis ?Breast, right, needle core biopsy, posterior central ?FOCAL INVASIVE MODERATELY DIFFERENTIATED DUCTAL CARCINOMA, GRADE 2 (3+2+1) ?EXTENSIVE HIGH-GRADE DUCTAL CARCINOMA IN SITU, SOLID TYPE WITH NECROSIS ?INVASIVE TUMOR MEASURES 1.5 MM ?COMPLEX SCLEROSING LESION AND FIBROCYSTIC CHANGES INCLUDING EPITHELIAL HYPERPLASIA WITHOUT ATYPIA ?Microscopic Comment ?The breast prognostic markers have been ordered and the results will be issued in a subsequent addendum to this ?report. ?Case is reviewed by Dr. Sonnie Alamo who concurs with the diagnosis. ?Diagnosis called to [facility] by Dr. Alric Seton on 04/19/2021 at 10:15 AM. ?Tobin Chad  MD ?Pathologist, Electronic Signature ?(Case signed 04/19/2021) ?Specimen Gross and Clinical Information ?Specimen Comment ?TIF: 8:20 am CIT: < 5 minutes, right breast cancer, evaluate extent, enhancing mass ?Specimen

## 2021-05-01 NOTE — Anesthesia Procedure Notes (Signed)
Procedure Name: LMA Insertion ?Date/Time: 05/01/2021 8:39 AM ?Performed by: Amadeo Garnet, CRNA ?Pre-anesthesia Checklist: Emergency Drugs available, Patient identified, Suction available and Patient being monitored ?Patient Re-evaluated:Patient Re-evaluated prior to induction ?Oxygen Delivery Method: Circle system utilized ?Preoxygenation: Pre-oxygenation with 100% oxygen ?Induction Type: IV induction ?Ventilation: Mask ventilation without difficulty ?LMA: LMA inserted ?LMA Size: 4.0 ?Placement Confirmation: positive ETCO2 ?Dental Injury: Teeth and Oropharynx as per pre-operative assessment  ? ? ? ? ?

## 2021-05-01 NOTE — Discharge Instructions (Signed)
[facility] Office Phone Number [phone]  BREAST BIOPSY/ PARTIAL MASTECTOMY: POST OP INSTRUCTIONS  Always review your discharge instruction sheet given to you by the facility where your surgery was performed.  IF YOU HAVE DISABILITY OR FAMILY LEAVE FORMS, YOU MUST BRING THEM TO THE OFFICE FOR PROCESSING.  DO NOT GIVE THEM TO YOUR DOCTOR.  A prescription for pain medication may be given to you upon discharge.  Take your pain medication as prescribed, if needed.  If narcotic pain medicine is not needed, then you may take acetaminophen (Tylenol) or ibuprofen (Advil) as needed. Take your usually prescribed medications unless otherwise directed If you need a refill on your pain medication, please contact your pharmacy.  They will contact our office to request authorization.  Prescriptions will not be filled after 5pm or on week-ends. You should eat very light the first 24 hours after surgery, such as soup, crackers, pudding, etc.  Resume your normal diet the day after surgery. Most patients will experience some swelling and bruising in the breast.  Ice packs and a good support bra will help.  Swelling and bruising can take several days to resolve.  It is common to experience some constipation if taking pain medication after surgery.  Increasing fluid intake and taking a stool softener will usually help or prevent this problem from occurring.  A mild laxative (Milk of Magnesia or Miralax) should be taken according to package directions if there are no bowel movements after 48 hours. Unless discharge instructions indicate otherwise, you may remove your bandages 24-48 hours after surgery, and you may shower at that time.  You may have steri-strips (small skin tapes) in place directly over the incision.  These strips should be left on the skin for 7-10 days.  If your surgeon used skin glue on the incision, you may shower in 24 hours.  The glue will flake off over the next 2-3 weeks.  Any  sutures or staples will be removed at the office during your follow-up visit. ACTIVITIES:  You may resume regular daily activities (gradually increasing) beginning the next day.  Wearing a good support bra or sports bra minimizes pain and swelling.  You may have sexual intercourse when it is comfortable. You may drive when you no longer are taking prescription pain medication, you can comfortably wear a seatbelt, and you can safely maneuver your car and apply brakes. RETURN TO WORK:  ______________________________________________________________________________________ You should see your doctor in the office for a follow-up appointment approximately two weeks after your surgery.  Your doctor's nurse will typically make your follow-up appointment when she calls you with your pathology report.  Expect your pathology report 2-3 business days after your surgery.  You may call to check if you do not hear from us after three days. OTHER INSTRUCTIONS: _______________________________________________________________________________________________ _____________________________________________________________________________________________________________________________________ _____________________________________________________________________________________________________________________________________ _____________________________________________________________________________________________________________________________________  WHEN TO CALL YOUR DOCTOR: Fever over 101.0 Nausea and/or vomiting. Extreme swelling or bruising. Continued bleeding from incision. Increased pain, redness, or drainage from the incision.  The clinic staff is available to answer your questions during regular business hours.  Please don't hesitate to call and ask to speak to one of the nurses for clinical concerns.  If you have a medical emergency, go to the nearest emergency room or call 911.  A surgeon from [facility] is always on call at the hospital.  For further questions, please visit [email or web address]   

## 2021-05-01 NOTE — Transfer of Care (Signed)
Immediate Anesthesia Transfer of Care Note ? ?Patient: Jacqueline Ortiz ? ?Procedure(s) Performed: RIGHT BREAST LUMPECTOMY WITH RADIOACTIVE SEED LOCALIZATION X4 (Right: Breast) ?SENTINEL NODE BIOPSY (Right: Axilla) ? ?Patient Location: PACU ? ?Anesthesia Type:GA combined with regional for post-op pain ? ?Level of Consciousness: drowsy and patient cooperative ? ?Airway & Oxygen Therapy: Patient Spontanous Breathing and Patient connected to face mask oxygen ? ?Post-op Assessment: Report given to RN, Post -op Vital signs reviewed and stable and Patient moving all extremities ? ?Post vital signs: Reviewed and stable ? ?Last Vitals:  ?Vitals Value Taken Time  ?BP 124/69 05/01/21 1040  ?Temp    ?Pulse 82 05/01/21 1041  ?Resp 23 05/01/21 1041  ?SpO2 100 % 05/01/21 1041  ?Vitals shown include unvalidated device data. ? ?Last Pain:  ?Vitals:  ? 05/01/21 0712  ?TempSrc:   ?PainSc: 0-No pain  ?   ? ?  ? ?Complications: No notable events documented. ?

## 2021-05-02 ENCOUNTER — Encounter (HOSPITAL_COMMUNITY): Payer: Self-pay | Admitting: Surgery

## 2021-05-02 NOTE — Anesthesia Postprocedure Evaluation (Signed)
Anesthesia Post Note ? ?Patient: Jacqueline Ortiz ? ?Procedure(s) Performed: RIGHT BREAST LUMPECTOMY WITH RADIOACTIVE SEED LOCALIZATION X4 (Right: Breast) ?SENTINEL NODE BIOPSY (Right: Axilla) ? ?  ? ?Patient location during evaluation: PACU ?Anesthesia Type: Regional and General ?Level of consciousness: awake and alert ?Pain management: pain level controlled ?Vital Signs Assessment: post-procedure vital signs reviewed and stable ?Respiratory status: spontaneous breathing, nonlabored ventilation, respiratory function stable and patient connected to nasal cannula oxygen ?Cardiovascular status: blood pressure returned to baseline and stable ?Postop Assessment: no apparent nausea or vomiting ?Anesthetic complications: no ? ? ?No notable events documented. ? ?Last Vitals:  ?Vitals:  ? 05/01/21 1055 05/01/21 1110  ?BP: (!) 142/84 136/72  ?Pulse: 94 91  ?Resp: 18 16  ?Temp:  36.4 ?C  ?SpO2: 92% 95%  ?  ?Last Pain:  ?Vitals:  ? 05/01/21 1110  ?TempSrc:   ?PainSc: 2   ? ?Pain Goal:   ? ?  ?  ?  ?  ?  ?  ?  ? ?Kadin Bera L Marcin Holte ? ? ? ? ?

## 2021-05-03 LAB — SURGICAL PATHOLOGY

## 2021-05-08 ENCOUNTER — Encounter: Payer: Self-pay | Admitting: *Deleted

## 2021-05-09 ENCOUNTER — Encounter: Payer: Self-pay | Admitting: Surgery

## 2021-05-09 ENCOUNTER — Telehealth: Payer: Self-pay | Admitting: *Deleted

## 2021-05-09 NOTE — Telephone Encounter (Signed)
Spoke to pt regarding next step in xrt with Dr. Lisbeth Renshaw. Confirmed appt date and time. No further needs voiced. ?

## 2021-05-13 ENCOUNTER — Encounter: Payer: Self-pay | Admitting: Genetic Counselor

## 2021-05-13 NOTE — Progress Notes (Signed)
?Radiation Oncology         [phone] ?________________________________ ? ?Name: Jacqueline Ortiz        MRN: [id number]  ?Date of Service: 05/15/2021 DOB: May 13, 1945 ? ?GL:OVFIEPP, Delfino Lovett, MD  Truitt Merle, MD    ? ?REFERRING PHYSICIAN: Truitt Merle, MD ? ? ?DIAGNOSIS: The encounter diagnosis was Ductal carcinoma in situ (DCIS) of right breast. ? ? ?HISTORY OF PRESENT ILLNESS: Jacqueline Ortiz is a 76 y.o. female originally seen in the multidisciplinary breast clinic for a new diagnosis of right breast cancer. The patient was noted to have screening detected bilateral breast findings.  There were findings concerning for mass in the left breast which further diagnostic work-up showed to be benign-appearing cysts, however the findings on the right side were consistent with 3 separate masses.  By ultrasound at 12:00 there was a 1.5 cm mass, at 3:00 there was a 5 mm mass, and 930 o'clock there was a 5 mm mass.  Her axilla was negative for adenopathy.  She underwent biopsies on 03/15/2021, the 12 o'clock position revealed intermediate grade DCIS negative for any invasive carcinoma within a complex sclerosing lesion with microcalcifications epithelial hyperplasia without atypia was also seen in addition to fibrocystic change, this was ER/PR positive.  The 3:00 biopsy showed radial scar/complex sclerosing lesion negative for microcalcifications.  The third specimen at 83 showed high-grade DCIS solid type without necrosis involving a fibroadenoma negative for invasive disease or calcifications this was also ER/PR positive.   ? ?Since her last visit she underwent a right lumpectomy and sentinel node biopsy on 05/01/21. Final pathology showed multifocal intermediate grade DCIS, the largest measuring 13 mm, and with negative margins, the closest was 2 mm to the lateral margin. 3 sentinel nodes were negative. She's seen to discuss adjuvant radiotherapy. ? ? ? ?PREVIOUS RADIATION THERAPY: No ? ? ?PAST MEDICAL HISTORY:  ?Past Medical  History:  ?Diagnosis Date  ? Allergic rhinosinusitis 2008  ? usual daily sinus drainage and post nasal drip  ? Arthritis 2023  ? osteoarthritis  ? Cancer [facility])   ? Depression   ? patient denies depression/ states has "anxiety" most of her life, unsure date it started  ? Diverticulosis   ? patient unsure of date of diagnosis  ? Dry eyes, bilateral 2000  ? Duodenal diverticulum   ? patient unsure of diagnosis date  ? Dyslipidemia 2008  ? Eczema 1993  ? Esophageal stricture 2008  ? GERD (gastroesophageal reflux disease) 2008  ? remains an issue   ? Hemorrhoids 2003  ? internal and external- remains- treats as needed with topical gels  ? History of hiatal hernia   ? Hyperlipidemia 2008  ? Hypertension 2021  ? Nephrolithiasis 2017  ? Osteoporosis 2003  ? Panic disorder   ? patient reports anxiety most of her life  ? Paralyzed vocal cords 1993  ? 30 years ago/ per pt unsure of cause" speaks in soft whispery tone is normal"  ? Scoliosis deformity of spine   ? very severe"can't lay flat with out cushion"- started as a young teenager  ? Tubular adenoma of colon 09/06/2004  ? Dr. Silvano Rusk  ?   ? ? ?PAST SURGICAL HISTORY: ?Past Surgical History:  ?Procedure Laterality Date  ? BREAST BIOPSY Right 03/15/2021  ? BREAST BIOPSY Right 04/18/2021  ? BREAST LUMPECTOMY WITH RADIOACTIVE SEED LOCALIZATION Right 05/01/2021  ? Procedure: RIGHT BREAST LUMPECTOMY WITH RADIOACTIVE SEED LOCALIZATION X4;  Surgeon: Erroll Luna, MD;  Location: [facility];  Service: General;  Laterality: Right;  ? CESAREAN SECTION    ? COLONOSCOPY  2022  ? Dr. Silvano Rusk  ? DILATION AND CURETTAGE OF UTERUS    ? ESOPHAGOGASTRODUODENOSCOPY  02/02/2003  ? Dr. Silvano Rusk  ? ESOPHAGOGASTRODUODENOSCOPY N/A 10/21/2015  ? Procedure: ESOPHAGOGASTRODUODENOSCOPY (EGD);  Surgeon: Milus Banister, MD;  Location: [facility];  Service: Endoscopy;  Laterality: N/A;  ? HIATAL HERNIA REPAIR    ? KNEE SURGERY Right   ? right fx- retained hardware  ? NISSEN FUNDOPLICATION   [id number]  ? SENTINEL NODE BIOPSY Right 05/01/2021  ? Procedure: SENTINEL NODE BIOPSY;  Surgeon: Erroll Luna, MD;  Location: [facility];  Service: General;  Laterality: Right;  ? TONSILLECTOMY  36  ? age 89  ? TUBAL LIGATION    ? UPPER GASTROINTESTINAL ENDOSCOPY    ? vocal cord surgery    ? "did improve condition"  ? ? ? ?FAMILY HISTORY:  ?Family History  ?Problem Relation Age of Onset  ? Heart disease Mother   ? Diabetes Mother   ? Melanoma Father   ? Heart disease Father   ? Arthritis Father   ? Heart disease Sister   ? Skin cancer Brother   ?     patient unsure of what type of skin cancer brother had  ? Colon polyps Brother   ? Colon cancer Maternal Grandmother 93  ? Cancer Maternal Aunt 65  ?     sinus cancer  ? Esophageal cancer Neg Hx   ? Rectal cancer Neg Hx   ? Stomach cancer Neg Hx   ? Breast cancer Neg Hx   ? ? ? ?SOCIAL HISTORY:  reports that she has never smoked. She has never used smokeless tobacco. She reports that she does not drink alcohol and does not use drugs. The patient is married and lives in [place]. She is retired from working as an Web designer. She enjoys reading [place] fiction books.  ? ? ?ALLERGIES: Patient has no known allergies. ? ? ?MEDICATIONS:  ?Current Outpatient Medications  ?Medication Sig Dispense Refill  ? potassium bicarbonate (K-LYTE) 25 MEQ disintegrating tablet Take 250 mEq by mouth 2 (two) times daily. Potassium Gummies    ? acetaminophen (TYLENOL) 325 MG tablet Take 325 mg by mouth 2 (two) times daily.    ? atorvastatin (LIPITOR) 20 MG tablet Take 20 mg by mouth in the morning.    ? Calcium-Magnesium-Vitamin D 017-51-025 MG-MG-UNIT CHEW Chew 2 capsules by mouth 2 (two) times daily.    ? escitalopram (LEXAPRO) 10 MG tablet Take 10 mg by mouth every evening.    ? Ketotifen Fumarate (REFRESH EYE ITCH RELIEF OP) Place 1 drop into both eyes 4 (four) times daily as needed (eye irritation.).    ? loratadine (CLARITIN) 10 MG tablet Take 10 mg by mouth in the morning.     ? Phenylephrine-Witch Hazel (PREPARATION H) 0.25-50 % GEL Apply 1 application. topically as needed (irritation/discomfort).    ? triamterene-hydrochlorothiazide (MAXZIDE-25) 37.5-25 MG tablet Take 1 tablet by mouth in the morning.    ? ?No current facility-administered medications for this encounter.  ? ? ? ?REVIEW OF SYSTEMS: On review of systems, the patient reports that she is doing well since surgery. She denies any concerns with redness or separation of her incision site. No pain is noted and no other complaints are verbalized.  ? ?  ? ?PHYSICAL EXAM:  ?Wt Readings from Last 3 Encounters:  ?05/15/21 129 lb (58.5 kg)  ?05/01/21 128 lb (  58.1 kg)  ?04/24/21 128 lb (58.1 kg)  ? ?Temp Readings from Last 3 Encounters:  ?05/15/21 (!) 97.2 ?F (36.2 ?C) (Temporal)  ?05/01/21 97.6 ?F (36.4 ?C)  ?04/24/21 97.7 ?F (36.5 ?C) (Oral)  ? ?BP Readings from Last 3 Encounters:  ?05/15/21 122/67  ?05/01/21 136/72  ?04/24/21 123/73  ? ?Pulse Readings from Last 3 Encounters:  ?05/15/21 83  ?05/01/21 91  ?04/24/21 83  ? ? ?In general this is a well appearing caucasian female in no acute distress. She's alert and oriented x4 and appropriate throughout the examination. Cardiopulmonary assessment is negative for acute distress and she exhibits normal effort. Her right breast has 2 well healed lumpectomy scars, and an axillary scar, all without separation drainage or bleeding. There is mild ecchymosis of the more central lumpectomy site. ? ? ? ?ECOG = 0 ? ?0 - Asymptomatic (Fully active, able to carry on all predisease activities without restriction) ? ?1 - Symptomatic but completely ambulatory (Restricted in physically strenuous activity but ambulatory and able to carry out work of a light or sedentary nature. For example, light housework, office work) ? ?2 - Symptomatic, <50% in bed during the day (Ambulatory and capable of all self care but unable to carry out any work activities. Up and about more than 50% of waking hours) ? ?3 -  Symptomatic, >50% in bed, but not bedbound (Capable of only limited self-care, confined to bed or chair 50% or more of waking hours) ? ?4 - Bedbound (Completely disabled. Cannot carry on any self-care. [place]

## 2021-05-14 ENCOUNTER — Telehealth: Payer: Self-pay

## 2021-05-14 NOTE — Telephone Encounter (Signed)
Appointment reminder. I spoke w/ patient, verified her identity and reminded patient of her 9:00am-05/15/21 in-person appointment w/ Shona Simpson PA-C. I advised patient to arrive 41mn early for check-in. I left my extension [phone] case patient needs anything. Patient verbalized understanding of information. ?

## 2021-05-15 ENCOUNTER — Other Ambulatory Visit: Payer: Self-pay

## 2021-05-15 ENCOUNTER — Encounter: Payer: Self-pay | Admitting: Radiation Oncology

## 2021-05-15 ENCOUNTER — Ambulatory Visit
Admission: RE | Admit: 2021-05-15 | Discharge: 2021-05-15 | Disposition: A | Discharge status: Home / Self Care | Payer: Medicare Other | Source: Ambulatory Visit | Attending: Radiation Oncology | Admitting: Radiation Oncology

## 2021-05-15 VITALS — BP 122/67 | HR 83 | Temp 97.2°F | Resp 20 | Ht 63.0 in | Wt 129.0 lb

## 2021-05-15 DIAGNOSIS — M1990 Unspecified osteoarthritis, unspecified site: Secondary | ICD-10-CM | POA: Diagnosis not present

## 2021-05-15 DIAGNOSIS — Z86010 Personal history of colonic polyps: Secondary | ICD-10-CM | POA: Diagnosis not present

## 2021-05-15 DIAGNOSIS — E785 Hyperlipidemia, unspecified: Secondary | ICD-10-CM | POA: Insufficient documentation

## 2021-05-15 DIAGNOSIS — Z79899 Other long term (current) drug therapy: Secondary | ICD-10-CM | POA: Diagnosis not present

## 2021-05-15 DIAGNOSIS — D0511 Intraductal carcinoma in situ of right breast: Secondary | ICD-10-CM | POA: Insufficient documentation

## 2021-05-15 DIAGNOSIS — Z170 Estrogen receptor positive status [ER+]: Secondary | ICD-10-CM | POA: Diagnosis not present

## 2021-05-15 DIAGNOSIS — M810 Age-related osteoporosis without current pathological fracture: Secondary | ICD-10-CM | POA: Insufficient documentation

## 2021-05-15 DIAGNOSIS — I10 Essential (primary) hypertension: Secondary | ICD-10-CM | POA: Diagnosis not present

## 2021-05-15 DIAGNOSIS — K219 Gastro-esophageal reflux disease without esophagitis: Secondary | ICD-10-CM | POA: Diagnosis not present

## 2021-05-15 DIAGNOSIS — Z808 Family history of malignant neoplasm of other organs or systems: Secondary | ICD-10-CM | POA: Diagnosis not present

## 2021-05-15 NOTE — Progress Notes (Signed)
Consult appointment. I verified patient identity and began nursing interview. Patient reports RT upper arm pain-posterior 3/10. No other issues reported at this time. ? ?Meaningful use complete. ? ?BP 122/67 (BP Location: Right Arm, Patient Position: Sitting, Cuff Size: Normal)   Pulse 83   Temp (!) 97.2 ?F (36.2 ?C) (Temporal)   Resp 20   Ht '5\' 3"'$  (1.6 m)   Wt 129 lb (58.5 kg)   SpO2 99%   BMI 22.85 kg/m?  ? ?

## 2021-05-16 ENCOUNTER — Encounter (HOSPITAL_COMMUNITY): Payer: Self-pay

## 2021-05-16 ENCOUNTER — Encounter: Payer: Self-pay | Admitting: *Deleted

## 2021-05-17 ENCOUNTER — Telehealth: Payer: Self-pay | Admitting: Hematology

## 2021-05-17 NOTE — Telephone Encounter (Signed)
Called pt to schedule, per 4/20 inbakset was hard to hear her due to service.  PT sent my chart msg stating she was not interested bc she was is not doing tx. Nurse navigator notified , appt cancelled ?

## 2021-05-22 DIAGNOSIS — D0511 Intraductal carcinoma in situ of right breast: Secondary | ICD-10-CM | POA: Diagnosis not present

## 2021-05-27 ENCOUNTER — Ambulatory Visit: Payer: Medicare Other | Admitting: Radiation Oncology

## 2021-05-28 ENCOUNTER — Ambulatory Visit: Payer: Medicare Other

## 2021-05-28 ENCOUNTER — Ambulatory Visit
Admission: RE | Admit: 2021-05-28 | Discharge: 2021-05-28 | Disposition: A | Discharge status: Home / Self Care | Payer: Medicare Other | Source: Ambulatory Visit | Attending: Radiation Oncology | Admitting: Radiation Oncology

## 2021-05-28 ENCOUNTER — Other Ambulatory Visit: Payer: Self-pay

## 2021-05-28 DIAGNOSIS — D0511 Intraductal carcinoma in situ of right breast: Secondary | ICD-10-CM | POA: Diagnosis present

## 2021-05-28 DIAGNOSIS — C50411 Malignant neoplasm of upper-outer quadrant of right female breast: Secondary | ICD-10-CM | POA: Insufficient documentation

## 2021-05-28 DIAGNOSIS — M810 Age-related osteoporosis without current pathological fracture: Secondary | ICD-10-CM | POA: Insufficient documentation

## 2021-05-28 DIAGNOSIS — Z170 Estrogen receptor positive status [ER+]: Secondary | ICD-10-CM | POA: Diagnosis not present

## 2021-05-28 DIAGNOSIS — Z510 Encounter for antineoplastic radiation therapy: Secondary | ICD-10-CM | POA: Insufficient documentation

## 2021-05-28 LAB — RAD ONC ARIA SESSION SUMMARY
Course Elapsed Days: 0
Plan Fractions Treated to Date: 1
Plan Prescribed Dose Per Fraction: 2.66 Gy
Plan Total Fractions Prescribed: 16
Plan Total Prescribed Dose: 42.56 Gy
Reference Point Dosage Given to Date: 2.66 Gy
Reference Point Session Dosage Given: 2.66 Gy
Session Number: 1

## 2021-05-29 ENCOUNTER — Other Ambulatory Visit: Payer: Self-pay

## 2021-05-29 ENCOUNTER — Ambulatory Visit
Admission: RE | Admit: 2021-05-29 | Discharge: 2021-05-29 | Disposition: A | Discharge status: Home / Self Care | Payer: Medicare Other | Source: Ambulatory Visit | Attending: Radiation Oncology | Admitting: Radiation Oncology

## 2021-05-29 DIAGNOSIS — Z510 Encounter for antineoplastic radiation therapy: Secondary | ICD-10-CM | POA: Diagnosis not present

## 2021-05-29 LAB — RAD ONC ARIA SESSION SUMMARY
Course Elapsed Days: 1
Plan Fractions Treated to Date: 2
Plan Prescribed Dose Per Fraction: 2.66 Gy
Plan Total Fractions Prescribed: 16
Plan Total Prescribed Dose: 42.56 Gy
Reference Point Dosage Given to Date: 5.32 Gy
Reference Point Session Dosage Given: 2.66 Gy
Session Number: 2

## 2021-05-30 ENCOUNTER — Other Ambulatory Visit: Payer: Self-pay

## 2021-05-30 ENCOUNTER — Ambulatory Visit
Admission: RE | Admit: 2021-05-30 | Discharge: 2021-05-30 | Disposition: A | Discharge status: Home / Self Care | Payer: Medicare Other | Source: Ambulatory Visit | Attending: Radiation Oncology | Admitting: Radiation Oncology

## 2021-05-30 DIAGNOSIS — Z510 Encounter for antineoplastic radiation therapy: Secondary | ICD-10-CM | POA: Diagnosis not present

## 2021-05-30 LAB — RAD ONC ARIA SESSION SUMMARY
Course Elapsed Days: 2
Plan Fractions Treated to Date: 3
Plan Prescribed Dose Per Fraction: 2.66 Gy
Plan Total Fractions Prescribed: 16
Plan Total Prescribed Dose: 42.56 Gy
Reference Point Dosage Given to Date: 7.98 Gy
Reference Point Session Dosage Given: 2.66 Gy
Session Number: 3

## 2021-05-31 ENCOUNTER — Ambulatory Visit
Admission: RE | Admit: 2021-05-31 | Discharge: 2021-05-31 | Disposition: A | Discharge status: Home / Self Care | Payer: Medicare Other | Source: Ambulatory Visit | Attending: Radiation Oncology | Admitting: Radiation Oncology

## 2021-05-31 ENCOUNTER — Other Ambulatory Visit: Payer: Self-pay

## 2021-05-31 DIAGNOSIS — Z510 Encounter for antineoplastic radiation therapy: Secondary | ICD-10-CM | POA: Diagnosis not present

## 2021-05-31 DIAGNOSIS — D0511 Intraductal carcinoma in situ of right breast: Secondary | ICD-10-CM

## 2021-05-31 LAB — RAD ONC ARIA SESSION SUMMARY
Course Elapsed Days: 3
Plan Fractions Treated to Date: 4
Plan Prescribed Dose Per Fraction: 2.66 Gy
Plan Total Fractions Prescribed: 16
Plan Total Prescribed Dose: 42.56 Gy
Reference Point Dosage Given to Date: 10.64 Gy
Reference Point Session Dosage Given: 2.66 Gy
Session Number: 4

## 2021-05-31 MED ORDER — RADIAPLEXRX EX GEL: Freq: Once | CUTANEOUS | Status: AC

## 2021-05-31 MED ORDER — ALRA NON-METALLIC DEODORANT (RAD-ONC)
1.0000 "application " | Freq: Once | TOPICAL | Status: AC
  Administered 2021-05-31: 1 via TOPICAL

## 2021-05-31 NOTE — Progress Notes (Signed)
Pt here for patient teaching.  Pt given Radiation and You booklet, skin care instructions, alra deodorant and Radiaplex.    Reviewed areas of pertinence such as fatigue, hair loss, skin changes, breast tenderness, and breast swelling. Pt able to give teach back of to pat skin and use unscented/gentle soap,apply Radiaplex bid, avoid applying anything to skin within 4 hours of treatment, avoid wearing an under wire bra, and to use an electric razor if they must shave. Pt verbalizes understanding of information given and will contact nursing with any questions or concerns.    Dariush Mcnellis M. Earland Reish RN, BSN  

## 2021-06-03 ENCOUNTER — Other Ambulatory Visit: Payer: Self-pay

## 2021-06-03 ENCOUNTER — Ambulatory Visit
Admission: RE | Admit: 2021-06-03 | Discharge: 2021-06-03 | Disposition: A | Discharge status: Home / Self Care | Payer: Medicare Other | Source: Ambulatory Visit | Attending: Radiation Oncology | Admitting: Radiation Oncology

## 2021-06-03 DIAGNOSIS — Z510 Encounter for antineoplastic radiation therapy: Secondary | ICD-10-CM | POA: Diagnosis not present

## 2021-06-03 LAB — RAD ONC ARIA SESSION SUMMARY
Course Elapsed Days: 6
Plan Fractions Treated to Date: 5
Plan Prescribed Dose Per Fraction: 2.66 Gy
Plan Total Fractions Prescribed: 16
Plan Total Prescribed Dose: 42.56 Gy
Reference Point Dosage Given to Date: 13.3 Gy
Reference Point Session Dosage Given: 2.66 Gy
Session Number: 5

## 2021-06-04 ENCOUNTER — Other Ambulatory Visit: Payer: Self-pay

## 2021-06-04 ENCOUNTER — Ambulatory Visit
Admission: RE | Admit: 2021-06-04 | Discharge: 2021-06-04 | Disposition: A | Discharge status: Home / Self Care | Payer: Medicare Other | Source: Ambulatory Visit | Attending: Radiation Oncology | Admitting: Radiation Oncology

## 2021-06-04 DIAGNOSIS — Z510 Encounter for antineoplastic radiation therapy: Secondary | ICD-10-CM | POA: Diagnosis not present

## 2021-06-04 LAB — RAD ONC ARIA SESSION SUMMARY
Course Elapsed Days: 7
Plan Fractions Treated to Date: 6
Plan Prescribed Dose Per Fraction: 2.66 Gy
Plan Total Fractions Prescribed: 16
Plan Total Prescribed Dose: 42.56 Gy
Reference Point Dosage Given to Date: 15.96 Gy
Reference Point Session Dosage Given: 2.66 Gy
Session Number: 6

## 2021-06-04 NOTE — Progress Notes (Signed)
Received phone call from the patient stating she received a letter from her insurance company denying coverage of her radiation treatments.  I reached out to the billing department and was assured her treatments have been authorized Josem Kaufmann # [id number]).  I informed the patient that her treatment course was covered and she verbalized understanding.  She will return tomorrow for her treatment. ? ?Jacqueline Ortiz M. Jacqueline Ortiz, BSN  ?

## 2021-06-05 ENCOUNTER — Other Ambulatory Visit: Payer: Self-pay

## 2021-06-05 ENCOUNTER — Ambulatory Visit
Admission: RE | Admit: 2021-06-05 | Discharge: 2021-06-05 | Disposition: A | Discharge status: Home / Self Care | Payer: Medicare Other | Source: Ambulatory Visit | Attending: Radiation Oncology | Admitting: Radiation Oncology

## 2021-06-05 ENCOUNTER — Encounter: Payer: Self-pay | Admitting: Hematology

## 2021-06-05 ENCOUNTER — Inpatient Hospital Stay (HOSPITAL_BASED_OUTPATIENT_CLINIC_OR_DEPARTMENT_OTHER): Payer: Medicare Other | Admitting: Hematology

## 2021-06-05 VITALS — BP 122/73 | HR 86 | Temp 98.3°F | Resp 18 | Ht 63.0 in | Wt 129.4 lb

## 2021-06-05 DIAGNOSIS — D0511 Intraductal carcinoma in situ of right breast: Secondary | ICD-10-CM

## 2021-06-05 DIAGNOSIS — M810 Age-related osteoporosis without current pathological fracture: Secondary | ICD-10-CM | POA: Insufficient documentation

## 2021-06-05 DIAGNOSIS — Z510 Encounter for antineoplastic radiation therapy: Secondary | ICD-10-CM | POA: Diagnosis not present

## 2021-06-05 DIAGNOSIS — Z170 Estrogen receptor positive status [ER+]: Secondary | ICD-10-CM | POA: Insufficient documentation

## 2021-06-05 DIAGNOSIS — C50411 Malignant neoplasm of upper-outer quadrant of right female breast: Secondary | ICD-10-CM | POA: Insufficient documentation

## 2021-06-05 LAB — RAD ONC ARIA SESSION SUMMARY
Course Elapsed Days: 8
Plan Fractions Treated to Date: 7
Plan Prescribed Dose Per Fraction: 2.66 Gy
Plan Total Fractions Prescribed: 16
Plan Total Prescribed Dose: 42.56 Gy
Reference Point Dosage Given to Date: 18.62 Gy
Reference Point Session Dosage Given: 2.66 Gy
Session Number: 7

## 2021-06-05 NOTE — Progress Notes (Signed)
?[facility]   ?Telephone:([phone] [phone]   ?Clinic Follow up Note  ? ?Patient Care Team: ?Burnard Bunting, MD as PCP - General (Internal Medicine) ?Rockwell Germany, RN as Oncology Nurse Navigator ?Mauro Kaufmann, RN as Oncology Nurse Navigator ?Erroll Luna, MD as Consulting Physician (General Surgery) ?Truitt Merle, MD as Consulting Physician (Hematology) ?Kyung Rudd, MD as Consulting Physician (Radiation Oncology) ? ?Date of Service:  06/05/2021 ? ?CHIEF COMPLAINT: f/u of right breast DCIS ? ?CURRENT THERAPY:  ?Adjuvant radiation, 05/28/21 - 06/25/21 ? ?ASSESSMENT & PLAN:  ?Jacqueline Ortiz is a 76 y.o. post-menopausal female with  ? ?1. Right breast focal IDC and multifocal DCIS, pT1a N0Mo, stage IA ?-found on screening mammogram. Biopsy on 03/15/21 confirmed DCIS to 12 o'clock and 9:30. ?-breast MRI on 04/03/21 showed a 1 cm mass in UOQ right breast. Biopsy on 04/18/21 revealed focal invasive ductal carcinoma, grade 2, and extensive DCIS, ER+/PR+/Her2- by FISH. ?-right lumpectomies on 05/01/21 with Dr. Brantley Stage showed two residual foci of DCIS (13 mm and 1 mm), no invasive carcinoma identified. Margins and lymph nodes were negative.  I reviewed with her today. ?-she is currently receiving adjuvant radiation under Dr. Lisbeth Renshaw, scheduled to finish on 06/25/21. ?-Given her strongly positive ER and PR, I recommend antiestrogen therapy, which decrease her risk of cancer recurrence from her invasive cancer, and reduce future breast cancer by ~40%. Given her osteoporosis, I would recommend tamoxifen. The potential side effects, which includes but not limited to, hot flash, skin and vaginal dryness, slightly increased risk of cardiovascular disease and cataract, small risk of thrombosis and endometrial cancer, were discussed with her in great details. Preventive strategies for thrombosis, such as being physically active, using compression stocks, avoid cigarette smoking, etc., were reviewed with  her. I also recommend her to follow-up with her gynecologist once a year, and watch for vaginal spotting or bleeding, as a clinically sign of endometrial cancer, etc. She voiced good understanding. She is reluctant to proceed but will take time to think about her options. ?-We discussed breast cancer surveillance after she completes treatment, Including annual mammogram, breast exam every 6-12 months. If she chooses to take tamoxifen, we will see her here every 3-4 months for the first year, then every 6 months for 5 years. I also offered survivorship visit, she declines. ?  ?2. Osteoporosis ?-Her most recent DEXA on file was in 08/2007 showing a T-score of -3.3 (left femur). She previously took Fosamax for 5 years (~2003-2008) ?-she is on calcium and vit D supplements ?-we discussed that tamoxifen can strengthen her bones. ?  ?  ?PLAN:  ?-continue daily radiation ?-I recommend adjuvant tamoxifen, okay with low dose 35m daily, she wants to think about it and call me back. ?-f/u open depending on her decision about tamoxifen ? ? ?No problem-specific Assessment & Plan notes found for this encounter. ? ? ?SUMMARY OF ONCOLOGIC HISTORY: ?Oncology History Overview Note  ? Cancer Staging  ?Ductal carcinoma in situ (DCIS) of right breast ?Staging form: Breast, AJCC 8th Edition ?- Clinical stage from 03/15/2021: Stage 0 (cTis (DCIS), cN0, cM0, G2, ER+, PR+, HER2: Not Assessed) - Signed by FTruitt Merle MD on 03/26/2021 ? ?  ?Ductal carcinoma in situ (DCIS) of right breast  ?03/05/2021 Mammogram  ? CLINICAL DATA:  Bilateral screening recall for a right breast ?distortion and possible masses and a possible left breast mass. ?  ?EXAM: ?DIGITAL DIAGNOSTIC BILATERAL MAMMOGRAM WITH TOMOSYNTHESIS AND CAD; ?ULTRASOUND LEFT BREAST LIMITED; ULTRASOUND  RIGHT BREAST LIMITED ? ?IMPRESSION: ?1. There is a prominent distortion in the superior retroareolar ?right breast with a sonographic correlate at 12 o'clock. Cine images ?through this region  demonstrates that the actual area of abnormality may be broader than the 1.5 cm ultrasound measurement. ?  ?2. There is an indeterminate 5 mm mass in the right breast at 3 ?o'clock. ?  ?3.  There is an indeterminate 5 mm mass in the right breast at 9:30. ?  ?4.  No evidence of bilateral axillary lymphadenopathy. ?  ?5.  The mass in the left breast corresponds with a benign cyst. ?  ?03/15/2021 Cancer Staging  ? Staging form: Breast, AJCC 8th Edition ?- Clinical stage from 03/15/2021: Stage 0 (cTis (DCIS), cN0, cM0, G2, ER+, PR+, HER2: Not Assessed) - Signed by Truitt Merle, MD on 03/26/2021 ?Stage prefix: Initial diagnosis ?Histologic grading system: 3 grade system ? ?  ?03/15/2021 Initial Biopsy  ? Diagnosis ?1. Breast, right, needle core biopsy, 12 o'clock, 1cmfn, ribbon ?-DUCTAL CARCINOMA IN SITU, INTERMEDIATE NUCLEAR GRADE, SOLID TYPE WITHOUT NECROSIS ?-NEGATIVE FOR INVASIVE CARCINOMA ?-COMPLEX SCLEROSING LESION WITH MICROCALCIFICATIONS ?-PROLIFERATIVE FIBROCYSTIC CHANGES INCLUDING EPITHELIAL HYPERPLASIA WITHOUT ATYPIA ?2. Breast, right, needle core biopsy, 3 o'clock, 5cmfn, coil ?-RADIAL SCAR/COMPLEX SCLEROSING LESION ?-NEGATIVE FOR MICROCALCIFICATIONS ?-NEGATIVE FOR CARCINOMA ?3. Breast, right, needle core biopsy, 9:30 o'clock, 5cmfn, heart ?-HIGH-GRADE DUCTAL CARCINOMA IN SITU, SOLID TYPE WITHOUT NECROSIS INVOLVING A FIBROADENOMA ?-NEGATIVE FOR INVASIVE CARCINOMA ?-NEGATIVE FOR CALCIFICATIONS ? ?1. PROGNOSTIC INDICATORS ?Results: ?Estrogen Receptor: 90%, POSITIVE, STRONG STAINING INTENSITY ?Progesterone Receptor: 40%, POSITIVE, STRONG STAINING INTENSITY ? ?3. PROGNOSTIC INDICATORS ?Results: ?Estrogen Receptor: 100%, POSITIVE, STRONG STAINING INTENSITY ?Progesterone Receptor: 10%, POSITIVE, STRONG STAINING INTENSITY ?  ?03/21/2021 Initial Diagnosis  ? Ductal carcinoma in situ (DCIS) of right breast ? ?  ? Genetic Testing  ? [facility] CancerNext Panel was Negative. Report date is 04/08/2021. ? ?The CancerNext gene panel  offered by [employer] includes sequencing, rearrangement analysis, and RNA analysis for the following 36 genes:   APC, ATM, AXIN2, BARD1, BMPR1A, BRCA1, BRCA2, BRIP1, CDH1, CDK4, CDKN2A, CHEK2, DICER1, HOXB13, EPCAM, GREM1, MLH1, MSH2, MSH3, MSH6, MUTYH, NBN, NF1, NTHL1, PALB2, PMS2, POLD1, POLE, PTEN, RAD51C, RAD51D, RECQL, SMAD4, SMARCA4, STK11, and TP53.  ?  ?04/18/2021 Pathology Results  ? Diagnosis ?Breast, right, needle core biopsy, posterior central ?FOCAL INVASIVE MODERATELY DIFFERENTIATED DUCTAL CARCINOMA, GRADE 2 (3+2+1) ?EXTENSIVE HIGH-GRADE DUCTAL CARCINOMA IN SITU, SOLID TYPE WITH NECROSIS ?INVASIVE TUMOR MEASURES 1.5 MM ?COMPLEX SCLEROSING LESION AND FIBROCYSTIC CHANGES INCLUDING EPITHELIAL HYPERPLASIA WITHOUT ATYPIA ? ?PROGNOSTIC INDICATORS ?Results: ?The tumor cells are EQUIVOCAL for Her2 (2+). Her2 by FISH will be performed and the results reported separately. ?Estrogen Receptor: 100%, POSITIVE, STRONG STAINING INTENSITY ?Progesterone Receptor: 90%, POSITIVE, MODERATE STAINING INTENSITY ?Proliferation Marker Ki67: 10% ? ?FLUORESCENCE IN-SITU HYBRIDIZATION ?Results: ?GROUP 4: HER2 **NEGATIVE** ?  ?05/01/2021 Cancer Staging  ? Staging form: Breast, AJCC 8th Edition ?- Pathologic stage from 05/01/2021: pT1a, pN0, cM0, G2, ER+, PR+, HER2: Not Assessed - Signed by Truitt Merle, MD on 06/05/2021 ?Stage prefix: Initial diagnosis ?Histologic grading system: 3 grade system ?Residual tumor (R): R0 - None ? ?  ?05/01/2021 Definitive Surgery  ? FINAL MICROSCOPIC DIAGNOSIS:  ? ?A. BREAST, RIGHT MEDIAL, LUMPECTOMY:  ?A small focus of residual DCIS in the vicinity of healed biopsy site  ?with clear margins of resection.  ? ?B. BREAST, RIGHT LATERAL, LUMPECTOMY:  ?Residual foci of intermediate grade DCIS with solid growth pattern  ?adjacent to organized blood clots with clear margins of resection.  ?  Invasive carcinoma is not identified.  ?Please see the synoptic report for specimen E.  ? ?C. LYMPH NODE, RIGHT AXILLARY,  SENTINEL, EXCISION:  ?A lymph node with fatty infiltration which is negative for metastatic  ?carcinoma.  ? ?D. LYMPH NODE, RIGHT AXILLARY, SENTINEL, EXCISION:  ?A lymph node with fatty infiltration which is

## 2021-06-06 ENCOUNTER — Ambulatory Visit
Admission: RE | Admit: 2021-06-06 | Discharge: 2021-06-06 | Disposition: A | Discharge status: Home / Self Care | Payer: Medicare Other | Source: Ambulatory Visit | Attending: Radiation Oncology | Admitting: Radiation Oncology

## 2021-06-06 ENCOUNTER — Other Ambulatory Visit: Payer: Self-pay

## 2021-06-06 DIAGNOSIS — Z510 Encounter for antineoplastic radiation therapy: Secondary | ICD-10-CM | POA: Diagnosis not present

## 2021-06-06 LAB — RAD ONC ARIA SESSION SUMMARY
Course Elapsed Days: 9
Plan Fractions Treated to Date: 8
Plan Prescribed Dose Per Fraction: 2.66 Gy
Plan Total Fractions Prescribed: 16
Plan Total Prescribed Dose: 42.56 Gy
Reference Point Dosage Given to Date: 21.28 Gy
Reference Point Session Dosage Given: 2.66 Gy
Session Number: 8

## 2021-06-07 ENCOUNTER — Ambulatory Visit: Payer: Medicare Other

## 2021-06-10 ENCOUNTER — Ambulatory Visit
Admission: RE | Admit: 2021-06-10 | Discharge: 2021-06-10 | Disposition: A | Discharge status: Home / Self Care | Payer: Medicare Other | Source: Ambulatory Visit | Attending: Radiation Oncology | Admitting: Radiation Oncology

## 2021-06-10 ENCOUNTER — Other Ambulatory Visit: Payer: Self-pay

## 2021-06-10 DIAGNOSIS — Z510 Encounter for antineoplastic radiation therapy: Secondary | ICD-10-CM | POA: Diagnosis not present

## 2021-06-10 LAB — RAD ONC ARIA SESSION SUMMARY
Course Elapsed Days: 13
Plan Fractions Treated to Date: 9
Plan Prescribed Dose Per Fraction: 2.66 Gy
Plan Total Fractions Prescribed: 16
Plan Total Prescribed Dose: 42.56 Gy
Reference Point Dosage Given to Date: 23.94 Gy
Reference Point Session Dosage Given: 2.66 Gy
Session Number: 9

## 2021-06-11 ENCOUNTER — Other Ambulatory Visit: Payer: Self-pay

## 2021-06-11 ENCOUNTER — Ambulatory Visit
Admission: RE | Admit: 2021-06-11 | Discharge: 2021-06-11 | Disposition: A | Discharge status: Home / Self Care | Payer: Medicare Other | Source: Ambulatory Visit | Attending: Radiation Oncology | Admitting: Radiation Oncology

## 2021-06-11 DIAGNOSIS — Z510 Encounter for antineoplastic radiation therapy: Secondary | ICD-10-CM | POA: Diagnosis not present

## 2021-06-11 LAB — RAD ONC ARIA SESSION SUMMARY
Course Elapsed Days: 14
Plan Fractions Treated to Date: 10
Plan Prescribed Dose Per Fraction: 2.66 Gy
Plan Total Fractions Prescribed: 16
Plan Total Prescribed Dose: 42.56 Gy
Reference Point Dosage Given to Date: 26.6 Gy
Reference Point Session Dosage Given: 2.66 Gy
Session Number: 10

## 2021-06-12 ENCOUNTER — Other Ambulatory Visit: Payer: Self-pay

## 2021-06-12 ENCOUNTER — Ambulatory Visit
Admission: RE | Admit: 2021-06-12 | Discharge: 2021-06-12 | Disposition: A | Discharge status: Home / Self Care | Payer: Medicare Other | Source: Ambulatory Visit | Attending: Radiation Oncology | Admitting: Radiation Oncology

## 2021-06-12 DIAGNOSIS — Z510 Encounter for antineoplastic radiation therapy: Secondary | ICD-10-CM | POA: Diagnosis not present

## 2021-06-12 LAB — RAD ONC ARIA SESSION SUMMARY
Course Elapsed Days: 15
Plan Fractions Treated to Date: 11
Plan Prescribed Dose Per Fraction: 2.66 Gy
Plan Total Fractions Prescribed: 16
Plan Total Prescribed Dose: 42.56 Gy
Reference Point Dosage Given to Date: 29.26 Gy
Reference Point Session Dosage Given: 2.66 Gy
Session Number: 11

## 2021-06-13 ENCOUNTER — Other Ambulatory Visit: Payer: Self-pay

## 2021-06-13 ENCOUNTER — Ambulatory Visit
Admission: RE | Admit: 2021-06-13 | Discharge: 2021-06-13 | Disposition: A | Discharge status: Home / Self Care | Payer: Medicare Other | Source: Ambulatory Visit | Attending: Radiation Oncology | Admitting: Radiation Oncology

## 2021-06-13 DIAGNOSIS — Z510 Encounter for antineoplastic radiation therapy: Secondary | ICD-10-CM | POA: Diagnosis not present

## 2021-06-13 LAB — RAD ONC ARIA SESSION SUMMARY
Course Elapsed Days: 16
Plan Fractions Treated to Date: 12
Plan Prescribed Dose Per Fraction: 2.66 Gy
Plan Total Fractions Prescribed: 16
Plan Total Prescribed Dose: 42.56 Gy
Reference Point Dosage Given to Date: 31.92 Gy
Reference Point Session Dosage Given: 2.66 Gy
Session Number: 12

## 2021-06-14 ENCOUNTER — Other Ambulatory Visit: Payer: Self-pay

## 2021-06-14 ENCOUNTER — Ambulatory Visit
Admission: RE | Admit: 2021-06-14 | Discharge: 2021-06-14 | Disposition: A | Discharge status: Home / Self Care | Payer: Medicare Other | Source: Ambulatory Visit | Attending: Radiation Oncology | Admitting: Radiation Oncology

## 2021-06-14 ENCOUNTER — Ambulatory Visit: Payer: Medicare Other

## 2021-06-14 DIAGNOSIS — Z510 Encounter for antineoplastic radiation therapy: Secondary | ICD-10-CM | POA: Diagnosis not present

## 2021-06-14 LAB — RAD ONC ARIA SESSION SUMMARY
Course Elapsed Days: 17
Plan Fractions Treated to Date: 13
Plan Prescribed Dose Per Fraction: 2.66 Gy
Plan Total Fractions Prescribed: 16
Plan Total Prescribed Dose: 42.56 Gy
Reference Point Dosage Given to Date: 34.58 Gy
Reference Point Session Dosage Given: 2.66 Gy
Session Number: 13

## 2021-06-17 ENCOUNTER — Ambulatory Visit
Admission: RE | Admit: 2021-06-17 | Discharge: 2021-06-17 | Disposition: A | Discharge status: Home / Self Care | Payer: Medicare Other | Source: Ambulatory Visit | Attending: Radiation Oncology | Admitting: Radiation Oncology

## 2021-06-17 ENCOUNTER — Other Ambulatory Visit: Payer: Self-pay

## 2021-06-17 DIAGNOSIS — Z510 Encounter for antineoplastic radiation therapy: Secondary | ICD-10-CM | POA: Diagnosis not present

## 2021-06-17 LAB — RAD ONC ARIA SESSION SUMMARY
Course Elapsed Days: 20
Plan Fractions Treated to Date: 14
Plan Prescribed Dose Per Fraction: 2.66 Gy
Plan Total Fractions Prescribed: 16
Plan Total Prescribed Dose: 42.56 Gy
Reference Point Dosage Given to Date: 37.24 Gy
Reference Point Session Dosage Given: 2.66 Gy
Session Number: 14

## 2021-06-18 ENCOUNTER — Other Ambulatory Visit: Payer: Self-pay

## 2021-06-18 ENCOUNTER — Ambulatory Visit: Payer: Medicare Other | Admitting: Radiation Oncology

## 2021-06-18 ENCOUNTER — Ambulatory Visit
Admission: RE | Admit: 2021-06-18 | Discharge: 2021-06-18 | Disposition: A | Discharge status: Home / Self Care | Payer: Medicare Other | Source: Ambulatory Visit | Attending: Radiation Oncology | Admitting: Radiation Oncology

## 2021-06-18 DIAGNOSIS — Z510 Encounter for antineoplastic radiation therapy: Secondary | ICD-10-CM | POA: Diagnosis not present

## 2021-06-18 LAB — RAD ONC ARIA SESSION SUMMARY
Course Elapsed Days: 21
Plan Fractions Treated to Date: 15
Plan Prescribed Dose Per Fraction: 2.66 Gy
Plan Total Fractions Prescribed: 16
Plan Total Prescribed Dose: 42.56 Gy
Reference Point Dosage Given to Date: 39.9 Gy
Reference Point Session Dosage Given: 2.66 Gy
Session Number: 15

## 2021-06-19 ENCOUNTER — Ambulatory Visit: Payer: Medicare Other

## 2021-06-19 ENCOUNTER — Other Ambulatory Visit: Payer: Self-pay

## 2021-06-19 ENCOUNTER — Ambulatory Visit
Admission: RE | Admit: 2021-06-19 | Discharge: 2021-06-19 | Disposition: A | Discharge status: Home / Self Care | Payer: Medicare Other | Source: Ambulatory Visit | Attending: Radiation Oncology | Admitting: Radiation Oncology

## 2021-06-19 DIAGNOSIS — Z510 Encounter for antineoplastic radiation therapy: Secondary | ICD-10-CM | POA: Diagnosis not present

## 2021-06-19 LAB — RAD ONC ARIA SESSION SUMMARY
Course Elapsed Days: 22
Plan Fractions Treated to Date: 16
Plan Prescribed Dose Per Fraction: 2.66 Gy
Plan Total Fractions Prescribed: 16
Plan Total Prescribed Dose: 42.56 Gy
Reference Point Dosage Given to Date: 42.56 Gy
Reference Point Session Dosage Given: 2.66 Gy
Session Number: 16

## 2021-06-20 ENCOUNTER — Other Ambulatory Visit: Payer: Self-pay

## 2021-06-20 ENCOUNTER — Ambulatory Visit
Admission: RE | Admit: 2021-06-20 | Discharge: 2021-06-20 | Disposition: A | Discharge status: Home / Self Care | Payer: Medicare Other | Source: Ambulatory Visit | Attending: Radiation Oncology | Admitting: Radiation Oncology

## 2021-06-20 ENCOUNTER — Ambulatory Visit: Payer: Medicare Other

## 2021-06-20 DIAGNOSIS — Z510 Encounter for antineoplastic radiation therapy: Secondary | ICD-10-CM | POA: Diagnosis not present

## 2021-06-20 LAB — RAD ONC ARIA SESSION SUMMARY
Course Elapsed Days: 23
Plan Fractions Treated to Date: 1
Plan Prescribed Dose Per Fraction: 2 Gy
Plan Total Fractions Prescribed: 4
Plan Total Prescribed Dose: 8 Gy
Reference Point Dosage Given to Date: 44.56 Gy
Reference Point Session Dosage Given: 2 Gy
Session Number: 17

## 2021-06-21 ENCOUNTER — Ambulatory Visit
Admission: RE | Admit: 2021-06-21 | Discharge: 2021-06-21 | Disposition: A | Discharge status: Home / Self Care | Payer: Medicare Other | Source: Ambulatory Visit | Attending: Radiation Oncology | Admitting: Radiation Oncology

## 2021-06-21 ENCOUNTER — Other Ambulatory Visit: Payer: Self-pay

## 2021-06-21 ENCOUNTER — Ambulatory Visit: Payer: Medicare Other

## 2021-06-21 ENCOUNTER — Encounter: Payer: Self-pay | Admitting: *Deleted

## 2021-06-21 DIAGNOSIS — Z510 Encounter for antineoplastic radiation therapy: Secondary | ICD-10-CM | POA: Diagnosis not present

## 2021-06-21 DIAGNOSIS — D0511 Intraductal carcinoma in situ of right breast: Secondary | ICD-10-CM

## 2021-06-21 LAB — RAD ONC ARIA SESSION SUMMARY
Course Elapsed Days: 24
Plan Fractions Treated to Date: 2
Plan Prescribed Dose Per Fraction: 2 Gy
Plan Total Fractions Prescribed: 4
Plan Total Prescribed Dose: 8 Gy
Reference Point Dosage Given to Date: 46.56 Gy
Reference Point Session Dosage Given: 2 Gy
Session Number: 18

## 2021-06-24 ENCOUNTER — Ambulatory Visit: Payer: Medicare Other

## 2021-06-25 ENCOUNTER — Ambulatory Visit: Payer: Medicare Other

## 2021-06-25 ENCOUNTER — Other Ambulatory Visit: Payer: Self-pay

## 2021-06-25 ENCOUNTER — Ambulatory Visit
Admission: RE | Admit: 2021-06-25 | Discharge: 2021-06-25 | Disposition: A | Discharge status: Home / Self Care | Payer: Medicare Other | Source: Ambulatory Visit | Attending: Radiation Oncology | Admitting: Radiation Oncology

## 2021-06-25 DIAGNOSIS — Z510 Encounter for antineoplastic radiation therapy: Secondary | ICD-10-CM | POA: Diagnosis not present

## 2021-06-25 LAB — RAD ONC ARIA SESSION SUMMARY
Course Elapsed Days: 28
Plan Fractions Treated to Date: 3
Plan Prescribed Dose Per Fraction: 2 Gy
Plan Total Fractions Prescribed: 4
Plan Total Prescribed Dose: 8 Gy
Reference Point Dosage Given to Date: 48.56 Gy
Reference Point Session Dosage Given: 2 Gy
Session Number: 19

## 2021-06-25 NOTE — Progress Notes (Signed)
                                                                                                                                                             Patient Name: Jacqueline Ortiz MRN: [id number] DOB: [birth year] Referring Physician: Burnard Bunting (Profile Not Attached) Date of Service: 06/26/2021 [facility] [place]                                                        End Of Treatment Note  Diagnoses: D05.11-Intraductal carcinoma in situ of right breast  Cancer Staging:  Multifocal intermediate to high-grade DCIS of the right breast  Intent: Curative  Radiation Treatment Dates: 05/28/2021 through 06/26/2021 Site Technique Total Dose (Gy) Dose per Fx (Gy) Completed Fx Beam Energies  Breast, Right: Breast_R 3D 42.56/42.56 2.66 16/16 6XFFF  Breast, Right: Breast_R_Bst 3D 8/8 2 4/4 6X, 10X   Narrative: The patient tolerated radiation therapy relatively well. She developed fatigue and anticipated skin changes in the treatment field.   The patient will receive a call in about one month from the radiation oncology department. She will continue follow up with Dr. Burr Medico as well.   ________________________________________________   Carola Rhine, [facility]

## 2021-06-26 ENCOUNTER — Ambulatory Visit
Admission: RE | Admit: 2021-06-26 | Discharge: 2021-06-26 | Disposition: A | Discharge status: Home / Self Care | Payer: Medicare Other | Source: Ambulatory Visit | Attending: Radiation Oncology | Admitting: Radiation Oncology

## 2021-06-26 ENCOUNTER — Encounter: Payer: Self-pay | Admitting: Radiation Oncology

## 2021-06-26 ENCOUNTER — Other Ambulatory Visit: Payer: Self-pay

## 2021-06-26 DIAGNOSIS — Z510 Encounter for antineoplastic radiation therapy: Secondary | ICD-10-CM | POA: Diagnosis not present

## 2021-06-26 LAB — RAD ONC ARIA SESSION SUMMARY
Course Elapsed Days: 29
Plan Fractions Treated to Date: 4
Plan Prescribed Dose Per Fraction: 2 Gy
Plan Total Fractions Prescribed: 4
Plan Total Prescribed Dose: 8 Gy
Reference Point Dosage Given to Date: 50.56 Gy
Reference Point Session Dosage Given: 2 Gy
Session Number: 20

## 2021-06-27 ENCOUNTER — Telehealth: Payer: Self-pay | Admitting: Hematology

## 2021-06-27 NOTE — Telephone Encounter (Signed)
Called pt to schedule per 5/26 inbasket, pt was NOT interested in pursuing treatment and was not interested in scheduling a SCP visit. Nurse navigator notified.

## 2021-07-12 ENCOUNTER — Telehealth: Payer: Self-pay | Admitting: *Deleted

## 2021-07-12 NOTE — Telephone Encounter (Signed)
Spoke with pt about survivorship program with Mendel Ryder, NP. Pt is interested and scheduling will call with time and date.

## 2021-07-15 ENCOUNTER — Telehealth: Payer: Self-pay | Admitting: Adult Health

## 2021-07-15 NOTE — Telephone Encounter (Signed)
Scheduled per 6/16 in basket, pt has confirmed appt

## 2021-08-12 ENCOUNTER — Ambulatory Visit
Admission: RE | Admit: 2021-08-12 | Discharge: 2021-08-12 | Disposition: A | Discharge status: Home / Self Care | Payer: Medicare Other | Source: Ambulatory Visit | Attending: Radiation Oncology | Admitting: Radiation Oncology

## 2021-08-12 DIAGNOSIS — D0511 Intraductal carcinoma in situ of right breast: Secondary | ICD-10-CM | POA: Insufficient documentation

## 2021-08-12 NOTE — Progress Notes (Signed)
  Radiation Oncology         [phone] ________________________________  Name: Jacqueline Ortiz MRN: [id number]  Date of Service: 08/12/2021  DOB: 04/29/1945  Post Treatment Telephone Note  Diagnosis:   Multifocal intermediate to high-grade DCIS of the right breast  Intent: Curative  Radiation Treatment Dates: 05/28/2021 through 06/26/2021 Site Technique Total Dose (Gy) Dose per Fx (Gy) Completed Fx Beam Energies  Breast, Right: Breast_R 3D 42.56/42.56 2.66 16/16 6XFFF  Breast, Right: Breast_R_Bst 3D 8/8 2 4/4 6X, 10X   Narrative: The patient tolerated radiation therapy relatively well. She developed fatigue and anticipated skin changes in the treatment field.    Impression/Plan: 1. Multifocal intermediate to high-grade DCIS of the right breast. The patient has been doing well since completion of radiotherapy. We discussed that we would be happy to continue to follow her as needed, but she will also continue to follow up with Dr. Burr Medico in medical oncology. She was counseled on skin care as well as measures to avoid sun exposure to this area.  2. Survivorship. We discussed the importance of survivorship evaluation and encouraged her to attend her upcoming visit with that clinic.      Carola Rhine, PAC

## 2021-09-24 ENCOUNTER — Other Ambulatory Visit: Payer: Self-pay

## 2021-09-24 ENCOUNTER — Inpatient Hospital Stay: Payer: Medicare Other | Attending: Adult Health | Admitting: Adult Health

## 2021-09-24 ENCOUNTER — Encounter: Payer: Self-pay | Admitting: Adult Health

## 2021-09-24 VITALS — BP 116/74 | HR 70 | Temp 98.1°F | Resp 18 | Ht 63.0 in | Wt 130.3 lb

## 2021-09-24 DIAGNOSIS — Z923 Personal history of irradiation: Secondary | ICD-10-CM | POA: Diagnosis not present

## 2021-09-24 DIAGNOSIS — D0511 Intraductal carcinoma in situ of right breast: Secondary | ICD-10-CM | POA: Diagnosis present

## 2021-09-24 DIAGNOSIS — Z170 Estrogen receptor positive status [ER+]: Secondary | ICD-10-CM | POA: Diagnosis not present

## 2021-09-24 NOTE — Progress Notes (Signed)
[facility]  VISIT:  BRIEF ONCOLOGIC HISTORY:  Oncology History Overview Note   Cancer Staging  Ductal carcinoma in situ (DCIS) of right breast Staging form: Breast, AJCC 8th Edition - Clinical stage from 03/15/2021: Stage 0 (cTis (DCIS), cN0, cM0, G2, ER+, PR+, HER2: Not Assessed) - Signed by Truitt Merle, MD on 03/26/2021    Ductal carcinoma in situ (DCIS) of right breast  03/05/2021 Mammogram   CLINICAL DATA:  Bilateral screening recall for a right breast distortion and possible masses and a possible left breast mass.   EXAM: DIGITAL DIAGNOSTIC BILATERAL MAMMOGRAM WITH TOMOSYNTHESIS AND CAD; ULTRASOUND LEFT BREAST LIMITED; ULTRASOUND RIGHT BREAST LIMITED  IMPRESSION: 1. There is a prominent distortion in the superior retroareolar right breast with a sonographic correlate at 12 o'clock. Cine images through this region demonstrates that the actual area of abnormality may be broader than the 1.5 cm ultrasound measurement.   2. There is an indeterminate 5 mm mass in the right breast at 3 o'clock.   3.  There is an indeterminate 5 mm mass in the right breast at 9:30.   4.  No evidence of bilateral axillary lymphadenopathy.   5.  The mass in the left breast corresponds with a benign cyst.   03/15/2021 Cancer Staging   Staging form: Breast, AJCC 8th Edition - Clinical stage from 03/15/2021: Stage 0 (cTis (DCIS), cN0, cM0, G2, ER+, PR+, HER2: Not Assessed) - Signed by Truitt Merle, MD on 03/26/2021 Stage prefix: Initial diagnosis Histologic grading system: 3 grade system   03/15/2021 Initial Biopsy   Diagnosis 1. Breast, right, needle core biopsy, 12 o'clock, 1cmfn, ribbon -DUCTAL CARCINOMA IN SITU, INTERMEDIATE NUCLEAR GRADE, SOLID TYPE WITHOUT NECROSIS -NEGATIVE FOR INVASIVE CARCINOMA -COMPLEX SCLEROSING LESION WITH MICROCALCIFICATIONS -PROLIFERATIVE FIBROCYSTIC CHANGES INCLUDING EPITHELIAL HYPERPLASIA WITHOUT ATYPIA 2. Breast, right, needle core biopsy, 3 o'clock, 5cmfn, coil -RADIAL  SCAR/COMPLEX SCLEROSING LESION -NEGATIVE FOR MICROCALCIFICATIONS -NEGATIVE FOR CARCINOMA 3. Breast, right, needle core biopsy, 9:30 o'clock, 5cmfn, heart -HIGH-GRADE DUCTAL CARCINOMA IN SITU, SOLID TYPE WITHOUT NECROSIS INVOLVING A FIBROADENOMA -NEGATIVE FOR INVASIVE CARCINOMA -NEGATIVE FOR CALCIFICATIONS  1. PROGNOSTIC INDICATORS Results: Estrogen Receptor: 90%, POSITIVE, STRONG STAINING INTENSITY Progesterone Receptor: 40%, POSITIVE, STRONG STAINING INTENSITY  3. PROGNOSTIC INDICATORS Results: Estrogen Receptor: 100%, POSITIVE, STRONG STAINING INTENSITY Progesterone Receptor: 10%, POSITIVE, STRONG STAINING INTENSITY   03/21/2021 Initial Diagnosis   Ductal carcinoma in situ (DCIS) of right breast    Genetic Testing   [facility] CancerNext Panel was Negative. Report date is 04/08/2021.  The CancerNext gene panel offered by [employer] includes sequencing, rearrangement analysis, and RNA analysis for the following 36 genes:   APC, ATM, AXIN2, BARD1, BMPR1A, BRCA1, BRCA2, BRIP1, CDH1, CDK4, CDKN2A, CHEK2, DICER1, HOXB13, EPCAM, GREM1, MLH1, MSH2, MSH3, MSH6, MUTYH, NBN, NF1, NTHL1, PALB2, PMS2, POLD1, POLE, PTEN, RAD51C, RAD51D, RECQL, SMAD4, SMARCA4, STK11, and TP53.    04/18/2021 Pathology Results   Diagnosis Breast, right, needle core biopsy, posterior central FOCAL INVASIVE MODERATELY DIFFERENTIATED DUCTAL CARCINOMA, GRADE 2 (3+2+1) EXTENSIVE HIGH-GRADE DUCTAL CARCINOMA IN SITU, SOLID TYPE WITH NECROSIS INVASIVE TUMOR MEASURES 1.5 MM COMPLEX SCLEROSING LESION AND FIBROCYSTIC CHANGES INCLUDING EPITHELIAL HYPERPLASIA WITHOUT ATYPIA  PROGNOSTIC INDICATORS Results: The tumor cells are EQUIVOCAL for Her2 (2+). Her2 by FISH will be performed and the results reported separately. Estrogen Receptor: 100%, POSITIVE, STRONG STAINING INTENSITY Progesterone Receptor: 90%, POSITIVE, MODERATE STAINING INTENSITY Proliferation Marker Ki67: 10%  FLUORESCENCE IN-SITU HYBRIDIZATION Results: GROUP  4: HER2 **NEGATIVE**   05/01/2021 Cancer Staging   Staging form: Breast, AJCC 8th Edition -  Pathologic stage from 05/01/2021: pT1a, pN0, cM0, G2, ER+, PR+, HER2: Not Assessed - Signed by Truitt Merle, MD on 06/05/2021 Stage prefix: Initial diagnosis Histologic grading system: 3 grade system Residual tumor (R): R0 - None   05/01/2021 Definitive Surgery   FINAL MICROSCOPIC DIAGNOSIS:   A. BREAST, RIGHT MEDIAL, LUMPECTOMY:  A small focus of residual DCIS in the vicinity of healed biopsy site  with clear margins of resection.   B. BREAST, RIGHT LATERAL, LUMPECTOMY:  Residual foci of intermediate grade DCIS with solid growth pattern  adjacent to organized blood clots with clear margins of resection.  Invasive carcinoma is not identified.  Please see the synoptic report for specimen E.   C. LYMPH NODE, RIGHT AXILLARY, SENTINEL, EXCISION:  A lymph node with fatty infiltration which is negative for metastatic  carcinoma.   D. LYMPH NODE, RIGHT AXILLARY, SENTINEL, EXCISION:  A lymph node with fatty infiltration which is negative for metastatic  carcinoma.   E. LYMPH NODE, RIGHT AXILLARY, SENTINEL, EXCISION:  A lymph node negative for metastatic carcinoma.    05/28/2021 - 06/26/2021 Radiation Therapy   Site Technique Total Dose (Gy) Dose per Fx (Gy) Completed Fx Beam Energies  Breast, Right: Breast_R 3D 42.56/42.56 2.66 16/16 6XFFF  Breast, Right: Breast_R_Bst 3D 8/8 2 4/4 6X, 10X       INTERVAL HISTORY:  [name] to review her survivorship care plan detailing her treatment course for breast cancer, as well as monitoring long-term side effects of that treatment, education regarding health maintenance, screening, and overall wellness and health promotion.     Overall, [name] reports feeling quite well. She was offered tamoxifen, however after weighing the risks and benefits she chose not to take this.    REVIEW OF SYSTEMS:  Review of Systems  Constitutional:  Negative for appetite change,  chills, fatigue, fever and unexpected weight change.  HENT:   Negative for hearing loss, lump/mass and trouble swallowing.   Eyes:  Negative for eye problems and icterus.  Respiratory:  Negative for chest tightness, cough and shortness of breath.   Cardiovascular:  Negative for chest pain, leg swelling and palpitations.  Gastrointestinal:  Negative for abdominal distention, abdominal pain, constipation, diarrhea, nausea and vomiting.  Endocrine: Negative for hot flashes.  Genitourinary:  Negative for difficulty urinating.   Musculoskeletal:  Negative for arthralgias.  Skin:  Negative for itching and rash.  Neurological:  Negative for dizziness, extremity weakness, headaches and numbness.  Hematological:  Negative for adenopathy. Does not bruise/bleed easily.  Psychiatric/Behavioral:  Negative for depression. The patient is not nervous/anxious.   Breast: Denies any new nodularity, masses, tenderness, nipple changes, or nipple discharge.      ONCOLOGY TREATMENT TEAM:  1. Surgeon:  Dr. Brantley Stage at [facility] 2. Medical Oncologist: Dr. Burr Medico  3. Radiation Oncologist: Dr. Lisbeth Renshaw    PAST MEDICAL/SURGICAL HISTORY:  Past Medical History:  Diagnosis Date   Allergic rhinosinusitis 2008   usual daily sinus drainage and post nasal drip   Arthritis 2023   osteoarthritis   Cancer ([place])    Depression    patient denies depression/ states has "anxiety" most of her life, unsure date it started   Diverticulosis    patient unsure of date of diagnosis   Dry eyes, bilateral 2000   Duodenal diverticulum    patient unsure of diagnosis date   Dyslipidemia 2008   Eczema 1993   Esophageal stricture 2008   GERD (gastroesophageal reflux disease) 2008   remains an issue  Hemorrhoids 2003   internal and external- remains- treats as needed with topical gels   History of hiatal hernia    Hyperlipidemia 2008   Hypertension 2021   Nephrolithiasis 2017   Osteoporosis 2003   Panic disorder     patient reports anxiety most of her life   Paralyzed vocal cords 1993   30 years ago/ per pt unsure of cause" speaks in soft whispery tone is normal"   Scoliosis deformity of spine    very severe"can't lay flat with out cushion"- started as a young teenager   Tubular adenoma of colon 09/06/2004   Dr. Silvano Rusk   Past Surgical History:  Procedure Laterality Date   BREAST BIOPSY Right 03/15/2021   BREAST BIOPSY Right 04/18/2021   BREAST LUMPECTOMY WITH RADIOACTIVE SEED LOCALIZATION Right 05/01/2021   Procedure: RIGHT BREAST LUMPECTOMY WITH RADIOACTIVE SEED LOCALIZATION X4;  Surgeon: Erroll Luna, MD;  Location: [facility];  Service: General;  Laterality: Right;   CESAREAN SECTION     COLONOSCOPY  2022   Dr. Silvano Rusk   DILATION AND CURETTAGE OF UTERUS     ESOPHAGOGASTRODUODENOSCOPY  02/02/2003   Dr. Silvano Rusk   ESOPHAGOGASTRODUODENOSCOPY N/A 10/21/2015   Procedure: ESOPHAGOGASTRODUODENOSCOPY (EGD);  Surgeon: Milus Banister, MD;  Location: [facility];  Service: Endoscopy;  Laterality: N/A;   HIATAL HERNIA REPAIR     KNEE SURGERY Right    right fx- retained hardware   NISSEN FUNDOPLICATION  [id number]   SENTINEL NODE BIOPSY Right 05/01/2021   Procedure: SENTINEL NODE BIOPSY;  Surgeon: Erroll Luna, MD;  Location: [facility];  Service: General;  Laterality: Right;   TONSILLECTOMY  1953   age 62   TUBAL LIGATION     UPPER GASTROINTESTINAL ENDOSCOPY     vocal cord surgery     "did improve condition"     ALLERGIES:  No Known Allergies   CURRENT MEDICATIONS:  Outpatient Encounter Medications as of 09/24/2021  Medication Sig   acetaminophen (TYLENOL) 325 MG tablet Take 325 mg by mouth 2 (two) times daily.   atorvastatin (LIPITOR) 20 MG tablet Take 20 mg by mouth in the morning.   Calcium-Magnesium-Vitamin D [phone] MG-MG-UNIT CHEW Chew 2 capsules by mouth 2 (two) times daily.   escitalopram (LEXAPRO) 10 MG tablet Take 10 mg by mouth every evening.   Ketotifen Fumarate  (REFRESH EYE ITCH RELIEF OP) Place 1 drop into both eyes 4 (four) times daily as needed (eye irritation.).   loratadine (CLARITIN) 10 MG tablet Take 10 mg by mouth in the morning.   Phenylephrine-Witch Hazel (PREPARATION H) 0.25-50 % GEL Apply 1 application. topically as needed (irritation/discomfort).   potassium bicarbonate (K-LYTE) 25 MEQ disintegrating tablet Take 250 mEq by mouth 2 (two) times daily. Potassium Gummies   triamterene-hydrochlorothiazide (MAXZIDE-25) 37.5-25 MG tablet Take 1 tablet by mouth in the morning.   No facility-administered encounter medications on file as of 09/24/2021.     ONCOLOGIC FAMILY HISTORY:  Family History  Problem Relation Age of Onset   Heart disease Mother    Diabetes Mother    Melanoma Father    Heart disease Father    Arthritis Father    Heart disease Sister    Skin cancer Brother        patient unsure of what type of skin cancer brother had   Colon polyps Brother    Colon cancer Maternal Grandmother 30   Cancer Maternal Aunt 65       sinus cancer   Esophageal cancer  Neg Hx    Rectal cancer Neg Hx    Stomach cancer Neg Hx    Breast cancer Neg Hx      GENETIC COUNSELING/TESTING: neg  SOCIAL HISTORY:  Social History   Socioeconomic History   Marital status: Married    Spouse name: Not on file   Number of children: 1   Years of education: Not on file   Highest education level: Not on file  Occupational History   Not on file  Tobacco Use   Smoking status: Never   Smokeless tobacco: Never  Vaping Use   Vaping Use: Never used  Substance and Sexual Activity   Alcohol use: No   Drug use: No   Sexual activity: Yes    Birth control/protection: None    Comment: Post menopause  Other Topics Concern   Not on file  Social History Narrative   Not on file   Social Determinants of Health   Financial Resource Strain: Not on file  Food Insecurity: Not on file  Transportation Needs: Not on file  Physical Activity: Not on file   Stress: Not on file  Social Connections: Not on file  Intimate Partner Violence: Not on file     OBSERVATIONS/OBJECTIVE:  BP 116/74 (BP Location: Left Arm, Patient Position: Sitting)   Pulse 70   Temp 98.1 F (36.7 C) (Tympanic)   Resp 18   Ht '5\' 3"'  (1.6 m)   Wt 130 lb 4.8 oz (59.1 kg)   SpO2 96%   BMI 23.08 kg/m  GENERAL: Patient is a well appearing female in no acute distress HEENT:  Sclerae anicteric.  Oropharynx clear and moist. No ulcerations or evidence of oropharyngeal candidiasis. Neck is supple.  NODES:  No cervical, supraclavicular, or axillary lymphadenopathy palpated.  BREAST EXAM:  Right breast s/p lumpectomy, no sign of local recurrence, left breast benign. LUNGS:  Clear to auscultation bilaterally.  No wheezes or rhonchi. HEART:  Regular rate and rhythm. No murmur appreciated. ABDOMEN:  Soft, nontender.  Positive, normoactive bowel sounds. No organomegaly palpated. MSK:  No focal spinal tenderness to palpation. Full range of motion bilaterally in the upper extremities. EXTREMITIES:  No peripheral edema.   SKIN:  Clear with no obvious rashes or skin changes. No nail dyscrasia. NEURO:  Nonfocal. Well oriented.  Appropriate affect.   LABORATORY DATA:  None for this visit.  DIAGNOSTIC IMAGING:  None for this visit.      ASSESSMENT AND PLAN:  Ms.. Ortiz is a pleasant 76 y.o. female with Stage IA right breast invasive ductal carcinoma, ER+/PR+/HER2-, diagnosed in 02/2021, treated with lumpectomy, adjuvant radiation therapy, and declined anti-estrogen therapy with Tamoxifen.  She presents to the Survivorship Clinic for our initial meeting and routine follow-up post-completion of treatment for breast cancer.    1. Stage IA right breast cancer:  Ms. Hammac is continuing to recover from definitive treatment for breast cancer. She will follow-up with myself or Lacie in 6 months with history and physical exam per surveillance protocol.  Her mammogram is due 01/2022; orders  placed today. Today, a comprehensive survivorship care plan and treatment summary was reviewed with the patient today detailing her breast cancer diagnosis, treatment course, potential late/long-term effects of treatment, appropriate follow-up care with recommendations for the future, and patient education resources.  A copy of this summary, along with a letter will be sent to the patient's primary care provider via mail/fax/In Basket message after today's visit.    2. Bone health: She was given education  on specific activities to promote bone health.  3. Cancer screening:  Due to Jacqueline Ortiz's history and her age, she should receive screening for skin cancers, colon cancer, and gynecologic cancers.  The information and recommendations are listed on the patient's comprehensive care plan/treatment summary and were reviewed in detail with the patient.  She is having some bladder prolapse concerns, therefore I referred her to gyn today.  4. Health maintenance and wellness promotion: Jacqueline Ortiz was encouraged to consume 5-7 servings of fruits and vegetables per day. We reviewed the "Nutrition Rainbow" handout.  She was also encouraged to engage in moderate to vigorous exercise for 30 minutes per day most days of the week. We discussed the [employer] fitness program, which is designed for cancer survivors to help them become more physically fit after cancer treatments.  She was instructed to limit her alcohol consumption and continue to abstain from tobacco use.     5. Support services/counseling: It is not uncommon for this period of the patient's cancer care trajectory to be one of many emotions and stressors.  She was given information regarding our available services and encouraged to contact me with any questions or for help enrolling in any of our support group/programs.    Follow up instructions:    -Return to cancer center 6 months  -Mammogram due in 01/2022 -Follow up with surgery 1 year -She is  welcome to return back to the Survivorship Clinic at any time; no additional follow-up needed at this time.  -Consider referral back to survivorship as a long-term survivor for continued surveillance  The patient was provided an opportunity to ask questions and all were answered. The patient agreed with the plan and demonstrated an understanding of the instructions.   Total encounter time:40 minutes*in face-to-face visit time, chart review, lab review, care coordination, order entry, and documentation of the encounter time.    Wilber Bihari, NP 09/24/21 11:09 AM Medical Oncology and Hematology [facility] [place], [place] [address] Tel. [phone]    Fax. [phone]  *Total Encounter Time as defined by the Centers for Medicare and Medicaid Services includes, in addition to the face-to-face time of a patient visit (documented in the note above) non-face-to-face time: obtaining and reviewing outside history, ordering and reviewing medications, tests or procedures, care coordination (communications with other health care professionals or caregivers) and documentation in the medical record.

## 2021-09-25 ENCOUNTER — Telehealth: Payer: Self-pay | Admitting: Hematology and Oncology

## 2021-09-25 NOTE — Telephone Encounter (Signed)
Scheduled appointment per 8/29 los. Patient is aware.

## 2021-10-09 ENCOUNTER — Encounter: Payer: Self-pay | Admitting: Adult Health

## 2021-11-19 ENCOUNTER — Ambulatory Visit: Payer: Medicare Other | Admitting: Obstetrics and Gynecology

## 2021-11-19 ENCOUNTER — Encounter: Payer: Self-pay | Admitting: Obstetrics and Gynecology

## 2021-11-19 ENCOUNTER — Other Ambulatory Visit: Payer: Self-pay

## 2021-11-19 VITALS — BP 130/83 | HR 91 | Ht 63.0 in

## 2021-11-19 DIAGNOSIS — N8189 Other female genital prolapse: Secondary | ICD-10-CM | POA: Diagnosis not present

## 2021-11-19 NOTE — Progress Notes (Signed)
GYNECOLOGY VISIT  Patient name: Jacqueline Ortiz MRN [id number]  Date of birth: 09-28-1945 Chief Complaint:   Gynecologic Exam   History:  Jacqueline Ortiz is a 76 y.o. [phone] being seen today for prolapse.  Over 1 years, feels sensation of it pulling down. Does not prevent her from voiding. Denies constipation. No bleeding or abnormal discharge. No prior treatments. No vaginal dryness. Not sexually active at this time. Years of mild urinary incontinence, not worse with prolapse.  Current complaints: uterovaginal prolapse   Past Medical History:  Diagnosis Date   Allergic rhinosinusitis 2008   usual daily sinus drainage and post nasal drip   Arthritis 2023   osteoarthritis   Cancer ([place])    Depression    patient denies depression/ states has "anxiety" most of her life, unsure date it started   Diverticulosis    patient unsure of date of diagnosis   Dry eyes, bilateral 2000   Duodenal diverticulum    patient unsure of diagnosis date   Dyslipidemia 2008   Eczema 1993   Esophageal stricture 2008   GERD (gastroesophageal reflux disease) 2008   remains an issue    Hemorrhoids 2003   internal and external- remains- treats as needed with topical gels   History of hiatal hernia    Hyperlipidemia 2008   Hypertension 2021   Nephrolithiasis 2017   Osteoporosis 2003   Panic disorder    patient reports anxiety most of her life   Paralyzed vocal cords 1993   30 years ago/ per pt unsure of cause" speaks in soft whispery tone is normal"   Scoliosis deformity of spine    very severe"can't lay flat with out cushion"- started as a young teenager   Tubular adenoma of colon 09/06/2004   Dr. Silvano Rusk    Past Surgical History:  Procedure Laterality Date   BREAST BIOPSY Right 03/15/2021   BREAST BIOPSY Right 04/18/2021   BREAST LUMPECTOMY WITH RADIOACTIVE SEED LOCALIZATION Right 05/01/2021   Procedure: RIGHT BREAST LUMPECTOMY WITH RADIOACTIVE SEED LOCALIZATION X4;  Surgeon: Erroll Luna, MD;  Location: [facility];  Service: General;  Laterality: Right;   CESAREAN SECTION     COLONOSCOPY  2022   Dr. Silvano Rusk   DILATION AND CURETTAGE OF UTERUS     ESOPHAGOGASTRODUODENOSCOPY  02/02/2003   Dr. Silvano Rusk   ESOPHAGOGASTRODUODENOSCOPY N/A 10/21/2015   Procedure: ESOPHAGOGASTRODUODENOSCOPY (EGD);  Surgeon: Milus Banister, MD;  Location: [facility];  Service: Endoscopy;  Laterality: N/A;   HIATAL HERNIA REPAIR     KNEE SURGERY Right    right fx- retained hardware   NISSEN FUNDOPLICATION  [id number]   SENTINEL NODE BIOPSY Right 05/01/2021   Procedure: SENTINEL NODE BIOPSY;  Surgeon: Erroll Luna, MD;  Location: [facility];  Service: General;  Laterality: Right;   TONSILLECTOMY  1953   age 24   TUBAL LIGATION     UPPER GASTROINTESTINAL ENDOSCOPY     vocal cord surgery     "did improve condition"    The following portions of the patient's history were reviewed and updated as appropriate: allergies, current medications, past family history, past medical history, past social history, past surgical history and problem list.    Review of Systems:  Pertinent items are noted in HPI. Comprehensive review of systems was otherwise negative.   Objective:  Physical Exam BP 130/83   Pulse 91   Ht '5\' 3"'$  (1.6 m)   BMI 23.08 kg/m    Physical Exam Vitals  and nursing note reviewed. Exam conducted with a chaperone present.  Constitutional:      Appearance: Normal appearance.  HENT:     Head: Normocephalic.  Pulmonary:     Effort: Pulmonary effort is normal.  Genitourinary:    General: Normal vulva.     Vagina: Normal.     Cervix: Normal.     Comments: Cervix noted beyond introitus at rest, no lesions or bleeding Atrophic vaginal introitus No descent of posterior vaginal wall Minimal descent of Aa point, moderate descent of Ab point Cervix reducible , - CST Stage 3 uterovaginal prolapse Neurological:     General: No focal deficit present.     Mental Status: She is alert.   Psychiatric:        Mood and Affect: Mood normal.        Behavior: Behavior normal.        Thought Content: Thought content normal.        Judgment: Judgment normal.       Assessment & Plan:   1. Other female genital prolapse Exam consistent with UV prolapse has mild SUI Patient not interested in surgery at this time, good candidate for pessary Pelvic PT to help address SUI and pelvic strength Recommend use of lubricant/moisturizer on prolapsed cervix for discomfort and prevent bleeding - Ambulatory referral to Physical Therapy - Ambulatory referral to Urogynecology  Routine preventative health maintenance measures emphasized.  Darliss Cheney, MD [facility]

## 2021-12-05 ENCOUNTER — Ambulatory Visit (INDEPENDENT_AMBULATORY_CARE_PROVIDER_SITE_OTHER): Payer: Medicare Other | Admitting: Obstetrics and Gynecology

## 2021-12-05 ENCOUNTER — Encounter: Payer: Self-pay | Admitting: Obstetrics and Gynecology

## 2021-12-05 ENCOUNTER — Other Ambulatory Visit (HOSPITAL_COMMUNITY)
Admission: RE | Admit: 2021-12-05 | Discharge: 2021-12-05 | Disposition: A | Discharge status: Home / Self Care | Payer: Medicare Other | Attending: Obstetrics and Gynecology | Admitting: Obstetrics and Gynecology

## 2021-12-05 VITALS — BP 132/79 | HR 91 | Ht 61.5 in | Wt 130.0 lb

## 2021-12-05 DIAGNOSIS — N816 Rectocele: Secondary | ICD-10-CM | POA: Diagnosis not present

## 2021-12-05 DIAGNOSIS — N813 Complete uterovaginal prolapse: Secondary | ICD-10-CM

## 2021-12-05 DIAGNOSIS — R3915 Urgency of urination: Secondary | ICD-10-CM | POA: Insufficient documentation

## 2021-12-05 DIAGNOSIS — N8110 Cystocele, unspecified: Secondary | ICD-10-CM | POA: Diagnosis not present

## 2021-12-05 DIAGNOSIS — N3941 Urge incontinence: Secondary | ICD-10-CM

## 2021-12-05 LAB — POCT URINALYSIS DIPSTICK
Bilirubin, UA: NEGATIVE
Glucose, UA: NEGATIVE
Ketones, UA: NEGATIVE
Nitrite, UA: NEGATIVE
Protein, UA: NEGATIVE
Spec Grav, UA: 1.02 (ref 1.010–1.025)
Urobilinogen, UA: 0.2 E.U./dL
pH, UA: 8 (ref 5.0–8.0)

## 2021-12-05 NOTE — Progress Notes (Signed)
Jacqueline Ortiz  Referring Provider: Darliss Cheney, Jacqueline Ortiz PCP: Jacqueline Bunting, Jacqueline Ortiz Date of Service: 12/05/2021  SUBJECTIVE Chief Complaint: New Patient (Initial Visit) (Jacqueline Ortiz is a 76 y.o. female here for a consult prolapse)  History of Present Illness: Jacqueline Ortiz is a 76 y.o. White or Caucasian female seen in [facility] at the request of Jacqueline Ortiz for evaluation of prolapse.    Review of records significant for: Has sensation of pulling down, noted uterovaginal prolapse on exam. Has had mild incontinence for years.   Urinary Symptoms: Leaks urine with with a full bladder, with movement to the bathroom, and with urgency Leaks is variable- a little bit every day.  Pad use: 4 pads per day.   She is bothered by her UI symptoms.  Day time voids- every 2-3 hours.  Nocturia: 2-3 times per night to void. Voiding dysfunction: she empties her bladder well.  does not use a catheter to empty bladder.  When urinating, she feels difficulty starting urine stream and dribbling after finishing Drinks: orange juice, sweet tea with lunch and dinner, 12oz water per day  UTIs:  0  UTI's in the last year.   Reports history of blood in urine and kidney or bladder stones  Pelvic Organ Prolapse Symptoms:                  She Admits to a feeling of a bulge the vaginal area. It has been present for 1 years.  She Admits to seeing a bulge.  This bulge is bothersome.  Bowel Symptom: Bowel movements: 2-3 time(s) per day Stool consistency: soft  Straining: yes.  Splinting: no.  Incomplete evacuation: no.  She Denies accidental bowel leakage / fecal incontinence Bowel regimen: none   Sexual Function Sexually active: yes.  Pain with sex: No  Pelvic Pain Denies pelvic pain    Past Medical History:  Past Medical History:  Diagnosis Date   Allergic rhinosinusitis 2008   usual daily sinus drainage and post nasal drip    Arthritis 2023   osteoarthritis   Breast cancer ([place])    Diverticulosis    patient unsure of date of diagnosis   Dry eyes, bilateral 2000   Duodenal diverticulum    patient unsure of diagnosis date   Dyslipidemia 2008   Eczema 1993   Esophageal stricture 2008   Hemorrhoids 2003   internal and external- remains- treats as needed with topical gels   History of hiatal hernia    Hyperlipidemia 2008   Hypertension 2021   Nephrolithiasis 2017   Osteoporosis 2003   Panic disorder    patient reports anxiety most of her life   Paralyzed vocal cords 1993   30 years ago/ per pt unsure of cause" speaks in soft whispery tone is normal"   Scoliosis deformity of spine    very severe"can't lay flat with out cushion"- started as a young teenager   Tubular adenoma of colon 09/06/2004   Jacqueline Ortiz     Past Surgical History:   Past Surgical History:  Procedure Laterality Date   BREAST BIOPSY Right 03/15/2021   BREAST BIOPSY Right 04/18/2021   BREAST LUMPECTOMY WITH RADIOACTIVE SEED LOCALIZATION Right 05/01/2021   Procedure: RIGHT BREAST LUMPECTOMY WITH RADIOACTIVE SEED LOCALIZATION X4;  Surgeon: Jacqueline Luna, Jacqueline Ortiz;  Location: [place];  Service: General;  Laterality: Right;   CESAREAN SECTION     COLONOSCOPY  2022   Jacqueline Ortiz   DILATION  AND CURETTAGE OF UTERUS     ESOPHAGOGASTRODUODENOSCOPY  02/02/2003   Jacqueline Ortiz   ESOPHAGOGASTRODUODENOSCOPY N/A 10/21/2015   Procedure: ESOPHAGOGASTRODUODENOSCOPY (EGD);  Surgeon: Jacqueline Banister, Jacqueline Ortiz;  Location: [facility];  Service: Endoscopy;  Laterality: N/A;   HIATAL HERNIA REPAIR     KNEE SURGERY Right    right fx- retained hardware   NISSEN FUNDOPLICATION  9470   SENTINEL NODE BIOPSY Right 05/01/2021   Procedure: SENTINEL NODE BIOPSY;  Surgeon: Jacqueline Luna, Jacqueline Ortiz;  Location: [place];  Service: General;  Laterality: Right;   TONSILLECTOMY  1953   age 32   TUBAL LIGATION     UPPER GASTROINTESTINAL ENDOSCOPY     vocal cord surgery      "did improve condition"     Past OB/GYN History: OB History  Gravida Para Term Preterm AB Living  '2 1   1 1 1  '$ SAB IAB Ectopic Multiple Live Births  1       1    # Outcome Date GA Lbr Len/2nd Weight Sex Delivery Anes PTL Lv  2 SAB           1 Preterm             Vaginal deliveries: 0,  Forceps/ Vacuum deliveries: 0, Cesarean section: 1 Menopausal: Denies vaginal bleeding since menopause   Any history of abnormal pap smears: no.   Medications: She has a current medication list which includes the following prescription(s): acetaminophen, atorvastatin, calcium-magnesium-vitamin d, ketotifen fumarate, loratadine, preparation h, and triamterene-hydrochlorothiazide.   Allergies: Patient has No Known Allergies.   Social History:  Social History   Tobacco Use   Smoking status: Never   Smokeless tobacco: Never  Vaping Use   Vaping Use: Never used  Substance Use Topics   Alcohol use: No   Drug use: No    Relationship status: married She lives with husband.   She is not employed. Regular exercise: No History of abuse: No  Family History:   Family History  Problem Relation Age of Onset   Heart disease Mother    Diabetes Mother    Melanoma Father    Heart disease Father    Arthritis Father    Heart disease Sister    Skin cancer Brother        patient unsure of what type of skin cancer brother had   Colon polyps Brother    Colon cancer Maternal Grandmother 35   Cancer Maternal Aunt 65       sinus cancer   Esophageal cancer Neg Hx    Rectal cancer Neg Hx    Stomach cancer Neg Hx    Breast cancer Neg Hx      Review of Systems: Review of Systems  Constitutional:  Negative for fever, malaise/fatigue and weight loss.  Respiratory:  Negative for cough, shortness of breath and wheezing.   Cardiovascular:  Negative for chest pain, palpitations and leg swelling.  Gastrointestinal:  Negative for abdominal pain and blood in stool.  Genitourinary:  Negative for  dysuria.  Musculoskeletal:  Negative for myalgias.  Skin:  Negative for rash.  Neurological:  Negative for dizziness and headaches.  Endo/Heme/Allergies:  Does not bruise/bleed easily.  Psychiatric/Behavioral:  Negative for depression. The patient is not nervous/anxious.      OBJECTIVE Physical Exam: Vitals:   12/05/21 1307  BP: 132/79  Pulse: 91  Weight: 130 lb (59 kg)  Height: 5' 1.5" (1.562 m)    Physical Exam Constitutional:  General: She is not in acute distress. Pulmonary:     Effort: Pulmonary effort is normal.  Abdominal:     General: There is no distension.     Palpations: Abdomen is soft.     Tenderness: There is no abdominal tenderness. There is no rebound.  Musculoskeletal:        General: No swelling. Normal range of motion.  Skin:    General: Skin is warm and dry.     Findings: No rash.  Neurological:     Mental Status: She is alert and oriented to person, place, and time.  Psychiatric:        Mood and Affect: Mood normal.        Behavior: Behavior normal.      GU / Detailed Urogynecologic Evaluation:  Pelvic Exam: Normal external female genitalia; Bartholin's and Skene's glands normal in appearance; urethral meatus normal in appearance, no urethral masses or discharge.   CST: negative  Complete uterine prolapse noted. normal vaginal mucosa with atrophy. Cervix normal appearance. Uterus normal single, nontender. Adnexa no mass, fullness, tenderness.    Pelvic floor strength 0/V  Pelvic floor musculature: Right levator non-tender, Right obturator non-tender, Left levator non-tender, Left obturator non-tender  POP-Q:   POP-Q  3                                            Aa   6                                           Ba  6                                              C   6                                            Gh  4                                            Pb  8                                            tvl   -3                                             Ap  6                                            Bp  -3  D      Rectal Exam:  Normal external rectum with dovetail sign  Post-Void Residual (PVR): In order to evaluate bladder emptying, we discussed obtaining a postvoid residual and she agreed to this procedure.  Procedure: The urethra was prepped and straight catheter was placed. A PVR of 40 ml was obtained.  Laboratory Results: POC urine: large leukocytes, negative nitrites, trace blood    ASSESSMENT AND PLAN Ms. Luepke is a 76 y.o. with:  1. Prolapse of anterior vaginal wall   2. Prolapse of posterior vaginal wall   3. Uterovaginal prolapse, complete   4. Urinary urgency   5. Urge incontinence    Stage IV anterior, Stage IV posterior, Stage IV apical prolapse - For treatment of pelvic organ prolapse, we discussed options for management including expectant management, conservative management, and surgical management, such as Kegels, a pessary, pelvic floor physical therapy, and specific surgical procedures. - She is interested in a pessary. We discussed that with her advanced prolapse, she may need a pessary that is more difficult to remove. She is sexually active so will want to maintain it herself.  - If pessary is not viable then would recommend sacrocolpopexy. Handout provided on surgical option for her to consider. Would also need urodynamic testing prior to surgery.   2. Urge incontinence - We discussed the symptoms of overactive bladder (OAB), which include urinary urgency, urinary frequency, nocturia, with or without urge incontinence.  While we do not know the exact etiology of OAB, several treatment options exist. We discussed management including behavioral therapy (decreasing bladder irritants, urge suppression strategies, timed voids, bladder retraining), physical therapy, medication.  - She will work on reducing bladder irritants (tea) and  drinking more water  Return for pessary fitting.    Jacqueline Folds, Jacqueline Ortiz

## 2021-12-05 NOTE — Patient Instructions (Addendum)
Today we talked about ways to manage bladder urgency such as altering your diet to avoid irritative beverages and foods (bladder diet) as well as attempting to decrease stress and other exacerbating factors.    The Most Bothersome Foods* The Least Bothersome Foods*  Coffee - Regular & Decaf Tea - caffeinated Carbonated beverages - cola, non-colas, diet & caffeine-free Alcohols - Beer, Red Wine, White Wine, Champagne Fruits - Grapefruit, [place], Orange, [employer] - Cranberry, Grapefruit, Orange, Pineapple Vegetables - Tomato & Tomato Products Flavor Enhancers - Hot peppers, Spicy foods, Chili, Horseradish, Vinegar, Monosodium glutamate (MSG) Artificial Sweeteners - NutraSweet, Sweet 'N Low, Equal (sweetener), Saccharin Ethnic foods - [place], [place], [place] food [employer] Scripts - low-fat & whole Fruits - Bananas, Blueberries, Honeydew melon, Pears, Raisins, Watermelon Vegetables - Broccoli, Brussels Sprouts, [place], Carrots, Cauliflower, [place], Cucumber, Mushrooms, Peas, Radishes, Squash, Zucchini, White potatoes, Sweet potatoes & yams Poultry - Chicken, Eggs, [place], [employer] - Beef, Programmer, multimedia, Lamb Seafood - Shrimp, [place] fish, Salmon Grains - Oat, Rice Snacks - Pretzels, Popcorn  *Lissa Morales et al. Diet and its role in interstitial cystitis/bladder pain syndrome (IC/BPS) and comorbid conditions. [place] 2012 Jan 11.   You have a stage 4 (out of 4) prolapse.  We discussed the fact that it is not life threatening but there are several treatment options. For treatment of pelvic organ prolapse, we discussed options for management including expectant management, conservative management, and surgical management, such as Kegels, a pessary, pelvic floor physical therapy, and specific surgical procedures.

## 2021-12-06 LAB — URINE CULTURE

## 2021-12-10 NOTE — Progress Notes (Unsigned)
[name]   Subjective:     Chief Complaint: Pessary fitting  History of Present Illness: Jacqueline Ortiz is a 76 y.o. female with stage IV pelvic organ prolapse who presents today for a pessary fitting.    Past Medical History: Patient  has a past medical history of Allergic rhinosinusitis (2008), Arthritis (2023), Breast cancer ([place]), Diverticulosis, Dry eyes, bilateral (2000), Duodenal diverticulum, Dyslipidemia (2008), Eczema (1993), Esophageal stricture (2008), Hemorrhoids (2003), History of hiatal hernia, Hyperlipidemia (2008), Hypertension (2021), Nephrolithiasis (2017), Osteoporosis (2003), Panic disorder, Paralyzed vocal cords (1993), Scoliosis deformity of spine, and Tubular adenoma of colon (09/06/2004).   Past Surgical History: She  has a past surgical history that includes Tubal ligation; Colonoscopy (2022); Cesarean section; Dilation and curettage of uterus; Knee surgery (Right); vocal cord surgery; Esophagogastroduodenoscopy (02/02/2003); Tonsillectomy (1953); Esophagogastroduodenoscopy (N/A, 10/21/2015); Upper gastrointestinal endoscopy; Breast biopsy (Right, 03/15/2021); Breast biopsy (Right, 04/18/2021); Hiatal hernia repair; Nissen fundoplication (6045); Breast lumpectomy with radioactive seed localization (Right, 05/01/2021); and Sentinel node biopsy (Right, 05/01/2021).   Medications: She has a current medication list which includes the following prescription(s): acetaminophen, atorvastatin, calcium-magnesium-vitamin d, ketotifen fumarate, loratadine, preparation h, and triamterene-hydrochlorothiazide.   Allergies: Patient has No Known Allergies.   Social History: Patient  reports that she has never smoked. She has never used smokeless tobacco. She reports that she does not drink alcohol and does not use drugs.      Objective:    BP 127/77   Pulse 90  Gen: No apparent distress, A&O x 3. Pelvic Exam: Normal external female genitalia; Bartholin's and  Skene's glands normal in appearance; urethral meatus normal in appearance, no urethral masses or discharge. Vaginal tissue is pale and thin.   Attempted a #3 donut without success, was comfortable but expelled when patient attempted to simulate BM.   Attempted a #5 incontinence dish without success, also expelled when attempting to simulate a BM  A #2 3/4 Gelhorn was fitted and she was able to simulate a bowel movement.   A size #5  long stem gellhorn pessary (Lot [id number]) was fitted. It was comfortable, stayed in place with valsalva and was an appropriate size on examination, with one finger fitting between the pessary and the vaginal walls. We tied a string to it and the patient demonstrated proper removal and replacement.   Patient attempted to remove on her own and was unable to in the lying or squatting position. She attempted to place pessary on her own and was unable to as well. We discussed that the other pessaries that are easier to remove will not be a good fit from her due to her advanced prolapse. She reports she and her husband will try to take it out and replace it.     Assessment/Plan:    Assessment: [name] is a 76 y.o. with stage IV pelvic organ prolapse who presents for a pessary fitting.  Plan: She was fitted with a #5 long stem gellhorn pessary. She will remove with her husband's assistance and try to replace . She will use vitamin E oil.   Discussed that there would be a good reason to use estrogen cream as her skin is very dry and thin. She reports she will do the over the counter lubrication and/or vitamin e cream or coconut oil.  Follow-up in 3 weeks for a pessary check or sooner as needed. Encouraged patient to bring her husband to the next appointment if needed so there can be some instructional time in which he  can practice with guidance and ask questions.    All questions were answered.    Berton Mount, NP

## 2021-12-11 ENCOUNTER — Encounter: Payer: Self-pay | Admitting: Obstetrics and Gynecology

## 2021-12-11 ENCOUNTER — Ambulatory Visit: Payer: Medicare Other | Admitting: Obstetrics and Gynecology

## 2021-12-11 VITALS — BP 127/77 | HR 90

## 2021-12-11 DIAGNOSIS — N813 Complete uterovaginal prolapse: Secondary | ICD-10-CM | POA: Diagnosis not present

## 2021-12-11 DIAGNOSIS — N952 Postmenopausal atrophic vaginitis: Secondary | ICD-10-CM

## 2021-12-11 NOTE — Patient Instructions (Signed)
Please use vitamin E oil or cream or coconut oil. May consider adding vaginal estrogen later.

## 2021-12-22 ENCOUNTER — Encounter (HOSPITAL_COMMUNITY): Payer: Self-pay | Admitting: Emergency Medicine

## 2021-12-22 ENCOUNTER — Ambulatory Visit (INDEPENDENT_AMBULATORY_CARE_PROVIDER_SITE_OTHER): Payer: Medicare Other

## 2021-12-22 ENCOUNTER — Ambulatory Visit (HOSPITAL_COMMUNITY)
Admission: EM | Admit: 2021-12-22 | Discharge: 2021-12-22 | Disposition: A | Discharge status: Home / Self Care | Payer: Medicare Other | Attending: Internal Medicine | Admitting: Internal Medicine

## 2021-12-22 DIAGNOSIS — K5900 Constipation, unspecified: Secondary | ICD-10-CM

## 2021-12-22 DIAGNOSIS — K5641 Fecal impaction: Secondary | ICD-10-CM

## 2021-12-22 MED ORDER — POLYETHYLENE GLYCOL 3350 17 GM/SCOOP PO POWD: 1.0000 | Freq: Every day | ORAL | 0 refills | Status: DC

## 2021-12-22 NOTE — ED Provider Notes (Signed)
[facility]    CSN: [id number] Arrival date & time: 12/22/21  1405      History   Chief Complaint Chief Complaint  Patient presents with   Constipation    HPI SAHER Ortiz is a 76 y.o. female with a history of diverticulosis, history of hiatal hernia status post Niesen fundoplication presents to UC today with complaint of constipation.  She reports she typically has a bowel movement every 3 days.  She reports she has not had a bowel movement in the last 5 days other than liquid brown stool in small amounts.  She reports some associated abdominal bloating but denies nausea, vomiting or abdominal pain.  She has not noticed any blood rectally.  She has been passing gas.  She did try Ex-Lax and an enema OTC with minimal relief of symptoms.  HPI  Past Medical History:  Diagnosis Date   Allergic rhinosinusitis 2008   usual daily sinus drainage and post nasal drip   Arthritis 2023   osteoarthritis   Breast cancer ([place])    Diverticulosis    patient unsure of date of diagnosis   Dry eyes, bilateral 2000   Duodenal diverticulum    patient unsure of diagnosis date   Dyslipidemia 2008   Eczema 1993   Esophageal stricture 2008   Hemorrhoids 2003   internal and external- remains- treats as needed with topical gels   History of hiatal hernia    Hyperlipidemia 2008   Hypertension 2021   Nephrolithiasis 2017   Osteoporosis 2003   Panic disorder    patient reports anxiety most of her life   Paralyzed vocal cords 1993   30 years ago/ per pt unsure of cause" speaks in soft whispery tone is normal"   Scoliosis deformity of spine    very severe"can't lay flat with out cushion"- started as a young teenager   Tubular adenoma of colon 09/06/2004   Dr. 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 Silvano Rusk   DILATION AND CURETTAGE OF UTERUS     ESOPHAGOGASTRODUODENOSCOPY  02/02/2003   Dr. Silvano Rusk   ESOPHAGOGASTRODUODENOSCOPY N/A 10/21/2015   Procedure: ESOPHAGOGASTRODUODENOSCOPY (EGD);  Surgeon: Milus Banister, MD;  Location: [facility];  Service: Endoscopy;  Laterality: N/A;   HIATAL HERNIA REPAIR     KNEE SURGERY Right    right fx- retained hardware   NISSEN FUNDOPLICATION  5916   SENTINEL NODE BIOPSY Right 05/01/2021   Procedure: SENTINEL NODE BIOPSY;  Surgeon: Erroll Luna, MD;  Location: [place];  Service: General;  Laterality: Right;   TONSILLECTOMY  1953   age 27   TUBAL LIGATION     UPPER GASTROINTESTINAL ENDOSCOPY     vocal  cord surgery     "did improve condition"    OB History     Gravida  2   Para  1   Term      Preterm  1   AB  1   Living  1      SAB  1   IAB      Ectopic      Multiple      Live Births  1            Home Medications    Prior to Admission medications   Medication Sig Start Date End Date Taking? Authorizing Provider  polyethylene glycol powder (MIRALAX) 17 GM/SCOOP powder Take 255 g by mouth daily. 12/22/21  Yes Jearld Fenton, NP  acetaminophen (TYLENOL) 325 MG tablet Take 325 mg by mouth 2 (two) times daily.    [provider]  atorvastatin (LIPITOR) 20 MG tablet Take 20 mg by mouth in the morning. 09/05/15    [provider]  Calcium-Magnesium-Vitamin D 500-50-100 MG-MG-UNIT CHEW Chew 2 capsules by mouth 2 (two) times daily.    [provider]  Ketotifen Fumarate (REFRESH EYE ITCH RELIEF OP) Place 1 drop into both eyes 4 (four) times daily as needed (eye irritation.).    [provider]  loratadine (CLARITIN) 10 MG tablet Take 10 mg by mouth in the morning.    [provider]  Phenylephrine-Witch Hazel (PREPARATION H) 0.25-50 % GEL Apply 1 application. topically as needed (irritation/discomfort).    [provider]  triamterene-hydrochlorothiazide (MAXZIDE-25) 37.5-25 MG tablet Take 1 tablet by mouth in the morning. 07/17/20   [provider]    Family History Family History  Problem Relation Age of Onset   Heart disease Mother    Diabetes Mother    Melanoma Father    Heart disease Father    Arthritis Father    Heart disease Sister    Skin cancer Brother        patient unsure of what type of skin cancer brother had   Colon polyps Brother    Colon cancer Maternal Grandmother 43   Cancer Maternal Aunt 65       sinus cancer   Esophageal cancer Neg Hx    Rectal cancer Neg Hx    Stomach cancer Neg Hx    Breast cancer Neg Hx     Social History Social History   Tobacco Use   Smoking status: Never   Smokeless tobacco: Never  Vaping Use   Vaping Use: Never used  Substance Use Topics   Alcohol use: No   Drug use: No     Allergies   Patient has no known allergies.   Review of Systems Review of Systems  Constitutional:  Positive for appetite change.  Respiratory:  Negative for cough, chest tightness and shortness of breath.   Cardiovascular:  Negative for chest pain.  Gastrointestinal:  Positive for abdominal distention and constipation. Negative for abdominal pain, blood in stool, nausea, rectal pain and vomiting.     Physical Exam Triage Vital Signs ED Triage Vitals  Enc Vitals Group     BP 12/22/21 1507 (!) 145/88      Pulse Rate 12/22/21 1507 (!) 102     Resp 12/22/21 1507 18     Temp 12/22/21 1507 98.3 F (36.8 C)     Temp Source 12/22/21 1507 Oral     SpO2 12/22/21 1507 94 %     Weight --  Height --      Head Circumference --      Peak Flow --      Pain Score 12/22/21 1506 4     Pain Loc --      Pain Edu? --      Excl. in [place]? --    No data found.  Updated Vital Signs BP (!) 145/88 (BP Location: Right Arm)   Pulse (!) 102   Temp 98.3 F (36.8 C) (Oral)   Resp 18   SpO2 94%       Physical Exam Constitutional:      Appearance: Normal appearance.  Cardiovascular:     Rate and Rhythm: Normal rate and regular rhythm.  Pulmonary:     Effort: Pulmonary effort is normal.     Breath sounds: Normal breath sounds.  Abdominal:     General: There is no distension.     Palpations: Abdomen is soft.     Tenderness: There is no abdominal tenderness.     Comments: Hyperactive bowel sounds  Genitourinary:    Comments: Normal rectal tone.  Soft stool noted in the rectal vault.  She does have external hemorrhoids that are not thrombosed or bleeding. Neurological:     Mental Status: She is alert and oriented to person, place, and time.      UC Treatments / Results    Radiology Imaging Orders         DG Abdomen 1 View     IMPRESSION: Nonobstructed gas pattern with moderate stool burden. Moderate fecal impaction at the rectum    Medications Ordered in UC Medications - No data to display  Initial Impression / Assessment and Plan / UC Course  I have reviewed the triage vital signs and the nursing notes.  Pertinent labs & imaging results that were available during my care of the patient were reviewed by me and considered in my medical decision making (see chart for details).    76 year old female with complaint of abdominal bloating and constipation x5 days.  KUB shows moderate stool burden with fecal impaction per my read, confirmed by radiology.  Manual disimpaction performed by  this provider.  Rx for MiraLAX 17 g p.o. daily.  Advised her to start taking Colace 200 mg p.o. nightly.  Encourage adequate water and fiber intake.  She will follow-up with her PCP if symptoms persist or worsen. Final Clinical Impressions(s) / UC Diagnoses   Final diagnoses:  Fecal impaction in rectum (HCC)  Constipation, unspecified constipation type     Discharge Instructions      You were seen today for constipation.  I want you to start MiraLAX 1 scoop every morning.  I also want you to take Colace 200 mg every evening.  You should increase your water intake so that you are getting at least 64 ounces of water daily.  You may try another fleets enema if you are unable to pass stool in the next 24 hours.  Please follow-up your PCP if symptoms persist or worsen.     ED Prescriptions     Medication Sig Dispense Auth. Provider   polyethylene glycol powder (MIRALAX) 17 GM/SCOOP powder Take 255 g by mouth daily. 255 g Jearld Fenton, NP      PDMP not reviewed this encounter.   Jearld Fenton, NP 12/22/21 1553

## 2021-12-22 NOTE — Discharge Instructions (Signed)
You were seen today for constipation.  I want you to start MiraLAX 1 scoop every morning.  I also want you to take Colace 200 mg every evening.  You should increase your water intake so that you are getting at least 64 ounces of water daily.  You may try another fleets enema if you are unable to pass stool in the next 24 hours.  Please follow-up your PCP if symptoms persist or worsen.

## 2021-12-22 NOTE — ED Triage Notes (Signed)
Pt reports for several days been constipated, took stool softer. Friday took laxative which didn't help. Yesterday did enema and had little stool. Reports has some runny stuff coming out but has something big that wont come.

## 2021-12-31 ENCOUNTER — Ambulatory Visit: Payer: Medicare Other | Admitting: Obstetrics and Gynecology

## 2022-01-03 ENCOUNTER — Ambulatory Visit (INDEPENDENT_AMBULATORY_CARE_PROVIDER_SITE_OTHER): Payer: Medicare Other | Admitting: Obstetrics and Gynecology

## 2022-01-03 ENCOUNTER — Other Ambulatory Visit (HOSPITAL_COMMUNITY)
Admission: RE | Admit: 2022-01-03 | Discharge: 2022-01-03 | Disposition: A | Discharge status: Home / Self Care | Payer: Medicare Other | Source: Ambulatory Visit | Attending: Obstetrics and Gynecology | Admitting: Obstetrics and Gynecology

## 2022-01-03 ENCOUNTER — Encounter: Payer: Self-pay | Admitting: Obstetrics and Gynecology

## 2022-01-03 VITALS — BP 115/76 | HR 74

## 2022-01-03 DIAGNOSIS — R3915 Urgency of urination: Secondary | ICD-10-CM | POA: Diagnosis not present

## 2022-01-03 DIAGNOSIS — N952 Postmenopausal atrophic vaginitis: Secondary | ICD-10-CM

## 2022-01-03 DIAGNOSIS — N813 Complete uterovaginal prolapse: Secondary | ICD-10-CM | POA: Diagnosis not present

## 2022-01-03 DIAGNOSIS — N3941 Urge incontinence: Secondary | ICD-10-CM | POA: Diagnosis not present

## 2022-01-03 DIAGNOSIS — N898 Other specified noninflammatory disorders of vagina: Secondary | ICD-10-CM | POA: Diagnosis present

## 2022-01-03 MED ORDER — ESTRADIOL 0.1 MG/GM VA CREA: 0.5000 g | TOPICAL_CREAM | VAGINAL | 11 refills | Status: DC

## 2022-01-03 NOTE — Patient Instructions (Signed)
Keep the other pessary in case the inflatoball does not work. You have been given instructions on the inflatoball pessary and were able to do it in the office.   Use the vaginal estrogen nightly for 2 weeks and then twice a week after that.   Follow up in 3 months or sooner if you need [place]. I hope this works well for you

## 2022-01-03 NOTE — Progress Notes (Signed)
[facility]   Subjective:     Chief Complaint:  Chief Complaint  Patient presents with   Vaginal Prolapse   History of Present Illness: Jacqueline Ortiz is a 75 y.o. female with stage IV pelvic organ prolapse who presents for a pessary check. She is using a size #5 long stem gellhorn pessary. The pessary has been working well and she has no complaints. She is not using vaginal estrogen. She denies vaginal bleeding.  Past Medical History: Patient  has a past medical history of Allergic rhinosinusitis (2008), Arthritis (2023), Breast cancer ([place]), Diverticulosis, Dry eyes, bilateral (2000), Duodenal diverticulum, Dyslipidemia (2008), Eczema (1993), Esophageal stricture (2008), Hemorrhoids (2003), History of hiatal hernia, Hyperlipidemia (2008), Hypertension (2021), Nephrolithiasis (2017), Osteoporosis (2003), Panic disorder, Paralyzed vocal cords (1993), Scoliosis deformity of spine, and Tubular adenoma of colon (09/06/2004).   Past Surgical History: She  has a past surgical history that includes Tubal ligation; Colonoscopy (2022); Cesarean section; Dilation and curettage of uterus; Knee surgery (Right); vocal cord surgery; Esophagogastroduodenoscopy (02/02/2003); Tonsillectomy (1953); Esophagogastroduodenoscopy (N/A, 10/21/2015); Upper gastrointestinal endoscopy; Breast biopsy (Right, 03/15/2021); Breast biopsy (Right, 04/18/2021); Hiatal hernia repair; Nissen fundoplication (3662); Breast lumpectomy with radioactive seed localization (Right, 05/01/2021); and Sentinel node biopsy (Right, 05/01/2021).   Medications: She has a current medication list which includes the following prescription(s): [START ON 01/06/2022] estradiol, acetaminophen, atorvastatin, calcium-magnesium-vitamin d, ketotifen fumarate, loratadine, preparation h, polyethylene glycol powder, and triamterene-hydrochlorothiazide.   Allergies: Patient has No Known Allergies.   Social History: Patient  reports that she  has never smoked. She has never used smokeless tobacco. She reports that she does not drink alcohol and does not use drugs.      Objective:    Physical Exam: BP 115/76   Pulse 74  Gen: No apparent distress, A&O x 3. Detailed Urogynecologic Evaluation:  Pelvic Exam: Normal external female genitalia; Bartholin's and Skene's glands normal in appearance; urethral meatus normal in appearance, no urethral masses or discharge. The pessary was noted to be in place. It was removed and cleaned. Speculum exam revealed no lesions in the vagina. The pessary was replaced. It was comfortable to the patient and fit well.  In vaginal tract, thick clumps of white discharge were noted.     Assessment/Plan:    Assessment: Jacqueline Ortiz is a 76 y.o. with stage IV pelvic organ prolapse here for a pessary check. She is doing well.She is unable to get the Gellhorn in and out for intercourse which was a goal of hers. This was an obvious cause of distress. She attempted in the office to remove the pessary and replace and was able to replace with difficulty but completely unable to remove. Today we inserted a Medium Inflatoball pessary (Lot [id number]) and she was able to take out and reinsert without significant difficulty. She wants to try this option with her husband and hopes it will be a viable option for easy removal for intimacy.   Plan: She will keep the pessary in place until next visit. She will start to use estrogen. She will follow-up in 3 months for a pessary check or sooner as needed.   Aptima swab done to assess for yeast due to white discharge in vaginal tract. If yeast is present will send Diflucan for patient.  Estrace sent in and explained how to use to patient.  All questions were answered.

## 2022-01-06 ENCOUNTER — Telehealth: Payer: Self-pay | Admitting: Obstetrics and Gynecology

## 2022-01-06 DIAGNOSIS — N952 Postmenopausal atrophic vaginitis: Secondary | ICD-10-CM

## 2022-01-06 MED ORDER — ESTRADIOL 0.1 MG/GM VA CREA: 0.5000 g | TOPICAL_CREAM | VAGINAL | 11 refills | Status: DC

## 2022-01-06 NOTE — Telephone Encounter (Signed)
Patient reports she has had some bleeding from her vaginal area. She reports she was straining to have a BM this weekend and her pessary came out still inflated. She reports she deflated and had trouble getting it back in at first but was able to lay down and re-insert. We discussed that she needs to use some vaginal estrogen cream for her vaginal atrophy and she reports that it was 77$ at [facility]. Over the phone we were able to see that using the GoodRx coupon code she could get it at [facility] for 27.65$. Cancled order for [facility] and ordered medication at [facility] in [place].   Encouraged patient to use the vaginal estrogen cream nightly for at least 3 nights with no pessary insertions to allow tissues time to rest and re-accommodate. She can then re-insert the pessary and see how that feels. Patient agreeable to plan of care at this time. Encouraged her to call back with other concerns or questions.

## 2022-01-07 LAB — CERVICOVAGINAL ANCILLARY ONLY
Bacterial Vaginitis (gardnerella): NEGATIVE
Candida Glabrata: NEGATIVE
Candida Vaginitis: NEGATIVE
Comment: NEGATIVE
Comment: NEGATIVE
Comment: NEGATIVE

## 2022-01-13 ENCOUNTER — Telehealth: Payer: Self-pay | Admitting: Obstetrics and Gynecology

## 2022-01-13 DIAGNOSIS — Z4689 Encounter for fitting and adjustment of other specified devices: Secondary | ICD-10-CM

## 2022-01-13 NOTE — Telephone Encounter (Signed)
Called patient and she reports her inflatoball pessary has been working well but the small ball that stops the air from coming out of the pessary seems to have gone up into the pessary. Patient does not currently have it in. Explained that if she cannot get it to slide back down into the tube to bring it in and I will fix it.

## 2022-01-13 NOTE — Telephone Encounter (Signed)
Patient called having issues with pessary, would like to discuss further

## 2022-01-28 ENCOUNTER — Telehealth: Payer: Self-pay | Admitting: Obstetrics and Gynecology

## 2022-01-28 DIAGNOSIS — Z4689 Encounter for fitting and adjustment of other specified devices: Secondary | ICD-10-CM

## 2022-01-28 NOTE — Telephone Encounter (Signed)
Called and spoke to patient regarding pessary and she states she is doing well and that she was able to troubleshoot the little ball in the Inflatoball pessary. Denies any complaints at this time.

## 2022-02-05 ENCOUNTER — Other Ambulatory Visit: Payer: Self-pay | Admitting: Obstetrics and Gynecology

## 2022-02-05 DIAGNOSIS — Z4689 Encounter for fitting and adjustment of other specified devices: Secondary | ICD-10-CM

## 2022-02-06 NOTE — Progress Notes (Signed)
Patient called stating that her pessary was not working correctly and she could not inflate it. We attempted to trouble-shoot the pessary on the phone and she reported that they had tried most everything I could recommend.   Patient brought the pessary up to the office so I could take a look at it. The bulb for inflation was the cause of the trouble. The small metal ball inside the piece that air passes through had fallen down and covered the hole. This was able to be pushed back up with a paperclip and functionality was restored.

## 2022-02-11 ENCOUNTER — Ambulatory Visit
Admission: RE | Admit: 2022-02-11 | Discharge: 2022-02-11 | Disposition: A | Discharge status: Home / Self Care | Payer: Medicare Other | Source: Ambulatory Visit | Attending: Adult Health | Admitting: Adult Health

## 2022-02-11 DIAGNOSIS — D0511 Intraductal carcinoma in situ of right breast: Secondary | ICD-10-CM

## 2022-02-14 ENCOUNTER — Ambulatory Visit: Payer: Medicare Other | Admitting: Obstetrics and Gynecology

## 2022-02-24 ENCOUNTER — Telehealth: Payer: Self-pay | Admitting: Obstetrics and Gynecology

## 2022-02-24 NOTE — Telephone Encounter (Signed)
Called and spoke to patient regarding her pessary. The bulb for inflating her Inflatoball pessary is not working correctly. We had previously troubleshot the pessary inflation device and it was causing issues. Will give replacement bulb.

## 2022-03-17 ENCOUNTER — Encounter: Payer: Self-pay | Admitting: *Deleted

## 2022-03-27 ENCOUNTER — Inpatient Hospital Stay: Payer: Medicare Other | Attending: Adult Health | Admitting: Adult Health

## 2022-03-27 ENCOUNTER — Encounter: Payer: Self-pay | Admitting: Adult Health

## 2022-03-27 VITALS — BP 133/80 | HR 78 | Temp 97.8°F | Resp 14 | Wt 132.8 lb

## 2022-03-27 DIAGNOSIS — Z170 Estrogen receptor positive status [ER+]: Secondary | ICD-10-CM | POA: Diagnosis not present

## 2022-03-27 DIAGNOSIS — C50811 Malignant neoplasm of overlapping sites of right female breast: Secondary | ICD-10-CM | POA: Diagnosis not present

## 2022-03-27 DIAGNOSIS — Z853 Personal history of malignant neoplasm of breast: Secondary | ICD-10-CM | POA: Diagnosis present

## 2022-03-27 DIAGNOSIS — Z23 Encounter for immunization: Secondary | ICD-10-CM | POA: Insufficient documentation

## 2022-03-27 NOTE — Progress Notes (Signed)
Jacqueline Ortiz Cancer Follow up:    Jacqueline Ortiz, Jacqueline Ortiz [id number]   DIAGNOSIS:  Cancer Staging  Malignant neoplasm of overlapping sites of right breast in female, estrogen receptor positive ([place]) Staging form: Breast, AJCC 8th Edition - Clinical stage from 03/15/2021: Stage 0 (cTis (DCIS), cN0, cM0, G2, ER+, PR+, HER2: Not Assessed) - Signed by Truitt Merle, MD on 03/26/2021 Stage prefix: Initial diagnosis Histologic grading system: 3 grade system - Pathologic stage from 05/01/2021: Stage IA (pT1a, pN0, cM0, G2, ER+, PR+, HER2-) - Signed by Gardenia Phlegm, NP on 03/27/2022 Stage prefix: Initial diagnosis Histologic grading system: 3 grade system Residual tumor (R): R0 - None   SUMMARY OF ONCOLOGIC HISTORY: Oncology History Overview Note   Cancer Staging  Ductal carcinoma in situ (DCIS) of right breast Staging form: Breast, AJCC 8th Edition - Clinical stage from 03/15/2021: Stage 0 (cTis (DCIS), cN0, cM0, G2, ER+, PR+, HER2: Not Assessed) - Signed by Truitt Merle, MD on 03/26/2021    Malignant neoplasm of overlapping sites of right breast in female, estrogen receptor positive ([place])  03/05/2021 Mammogram   CLINICAL DATA:  Bilateral screening recall for a right breast distortion and possible masses and a possible left breast mass.   EXAM: DIGITAL DIAGNOSTIC BILATERAL MAMMOGRAM WITH TOMOSYNTHESIS AND CAD; ULTRASOUND LEFT BREAST LIMITED; ULTRASOUND RIGHT BREAST LIMITED  IMPRESSION: 1. There is a prominent distortion in the superior retroareolar right breast with a sonographic correlate at 12 o'clock. Cine images through this region demonstrates that the actual area of abnormality may be broader than the 1.5 cm ultrasound measurement.   2. There is an indeterminate 5 mm mass in the right breast at 3 o'clock.   3.  There is an indeterminate 5 mm mass in the right breast at 9:30.   4.  No evidence of bilateral axillary lymphadenopathy.   5.   The mass in the left breast corresponds with a benign cyst.   03/15/2021 Cancer Staging   Staging form: Breast, AJCC 8th Edition - Clinical stage from 03/15/2021: Stage 0 (cTis (DCIS), cN0, cM0, G2, ER+, PR+, HER2: Not Assessed) - Signed by Truitt Merle, MD on 03/26/2021 Stage prefix: Initial diagnosis Histologic grading system: 3 grade system   03/15/2021 Initial Biopsy   Diagnosis 1. Breast, right, needle core biopsy, 12 o'clock, 1cmfn, ribbon -DUCTAL CARCINOMA IN SITU, INTERMEDIATE NUCLEAR GRADE, SOLID TYPE WITHOUT NECROSIS -NEGATIVE FOR INVASIVE CARCINOMA -COMPLEX SCLEROSING LESION WITH MICROCALCIFICATIONS -PROLIFERATIVE FIBROCYSTIC CHANGES INCLUDING EPITHELIAL HYPERPLASIA WITHOUT ATYPIA 2. Breast, right, needle core biopsy, 3 o'clock, 5cmfn, coil -RADIAL SCAR/COMPLEX SCLEROSING LESION -NEGATIVE FOR MICROCALCIFICATIONS -NEGATIVE FOR CARCINOMA 3. Breast, right, needle core biopsy, 9:30 o'clock, 5cmfn, heart -HIGH-GRADE DUCTAL CARCINOMA IN SITU, SOLID TYPE WITHOUT NECROSIS INVOLVING A FIBROADENOMA -NEGATIVE FOR INVASIVE CARCINOMA -NEGATIVE FOR CALCIFICATIONS  1. PROGNOSTIC INDICATORS Results: Estrogen Receptor: 90%, POSITIVE, STRONG STAINING INTENSITY Progesterone Receptor: 40%, POSITIVE, STRONG STAINING INTENSITY  3. PROGNOSTIC INDICATORS Results: Estrogen Receptor: 100%, POSITIVE, STRONG STAINING INTENSITY Progesterone Receptor: 10%, POSITIVE, STRONG STAINING INTENSITY   03/21/2021 Initial Diagnosis   Ductal carcinoma in situ (DCIS) of right breast    Genetic Testing   [facility] CancerNext Panel was Negative. Report date is 04/08/2021.  The CancerNext gene panel offered by [employer] includes sequencing, rearrangement analysis, and RNA analysis for the following 36 genes:   APC, ATM, AXIN2, BARD1, BMPR1A, BRCA1, BRCA2, BRIP1, CDH1, CDK4, CDKN2A, CHEK2, DICER1, HOXB13, EPCAM, GREM1, MLH1, MSH2, MSH3, MSH6, MUTYH, NBN, NF1, NTHL1, PALB2, PMS2, POLD1, POLE,  PTEN, RAD51C, RAD51D,  RECQL, SMAD4, SMARCA4, STK11, and TP53.    04/18/2021 Pathology Results   Diagnosis Breast, right, needle core biopsy, posterior central FOCAL INVASIVE MODERATELY DIFFERENTIATED DUCTAL CARCINOMA, GRADE 2 (3+2+1) EXTENSIVE HIGH-GRADE DUCTAL CARCINOMA IN SITU, SOLID TYPE WITH NECROSIS INVASIVE TUMOR MEASURES 1.5 MM COMPLEX SCLEROSING LESION AND FIBROCYSTIC CHANGES INCLUDING EPITHELIAL HYPERPLASIA WITHOUT ATYPIA  PROGNOSTIC INDICATORS Results: The tumor cells are EQUIVOCAL for Her2 (2+). Her2 by FISH will be performed and the results reported separately. Estrogen Receptor: 100%, POSITIVE, STRONG STAINING INTENSITY Progesterone Receptor: 90%, POSITIVE, MODERATE STAINING INTENSITY Proliferation Marker Ki67: 10%  FLUORESCENCE IN-SITU HYBRIDIZATION Results: GROUP 4: HER2 **NEGATIVE**   05/01/2021 Cancer Staging   Staging form: Breast, AJCC 8th Edition - Pathologic stage from 05/01/2021: Stage IA (pT1a, pN0, cM0, G2, ER+, PR+, HER2-) - Signed by Gardenia Phlegm, NP on 03/27/2022 Stage prefix: Initial diagnosis Histologic grading system: 3 grade system Residual tumor (R): R0 - None   05/01/2021 Definitive Surgery   FINAL MICROSCOPIC DIAGNOSIS:   A. BREAST, RIGHT MEDIAL, LUMPECTOMY:  A small focus of residual DCIS in the vicinity of healed biopsy site  with clear margins of resection.   B. BREAST, RIGHT LATERAL, LUMPECTOMY:  Residual foci of intermediate grade DCIS with solid growth pattern  adjacent to organized blood clots with clear margins of resection.  Invasive carcinoma is not identified.  Please see the synoptic report for specimen E.   C. LYMPH NODE, RIGHT AXILLARY, SENTINEL, EXCISION:  A lymph node with fatty infiltration which is negative for metastatic  carcinoma.   D. LYMPH NODE, RIGHT AXILLARY, SENTINEL, EXCISION:  A lymph node with fatty infiltration which is negative for metastatic  carcinoma.   E. LYMPH NODE, RIGHT AXILLARY, SENTINEL, EXCISION:  A lymph node  negative for metastatic carcinoma.    05/28/2021 - 06/26/2021 Radiation Therapy   Site Technique Total Dose (Gy) Dose per Fx (Gy) Completed Fx Beam Energies  Breast, Right: Breast_R 3D 42.56/42.56 2.66 16/16 6XFFF  Breast, Right: Breast_R_Bst 3D 8/8 2 4/4 6X, 10X       CURRENT THERAPY: Observation  INTERVAL HISTORY: Jacqueline Ortiz 77 y.o. female returns for follow-up of her history of breast cancer.  She is continued on observation alone.  Her most recent mammogram occurred on February 11, 2022 demonstrating no mammographic evidence of malignancy and breast density category C.  She exercises by riding an exercise bike.  She continues to see her PCP regularly.  She is up to date with her other cancer screening.      Patient Active Problem List   Diagnosis Date Noted   Genetic testing 04/12/2021   Family history of melanoma 03/28/2021   Family history of colon cancer 03/28/2021   Malignant neoplasm of overlapping sites of right breast in female, estrogen receptor positive ([place]) 03/05/2021   S/P Nissen fundoplication (with gastrostomy tube placement) ([place]) 02/06/2016   GERD with stricture 06/26/2011   Personal history of adenomatous colonic polyp 06/26/2011    has No Known Allergies.  MEDICAL HISTORY: Past Medical History:  Diagnosis Date   Allergic rhinosinusitis 2008   usual daily sinus drainage and post nasal drip   Arthritis 2023   osteoarthritis   Breast cancer [facility])    Diverticulosis    patient unsure of date of diagnosis   Dry eyes, bilateral 2000   Duodenal diverticulum    patient unsure of diagnosis date   Dyslipidemia 2008   Eczema 1993   Esophageal stricture 2008   Hemorrhoids 2003  internal and external- remains- treats as needed with topical gels   History of hiatal hernia    Hyperlipidemia 2008   Hypertension 2021   Nephrolithiasis 2017   Osteoporosis 2003   Panic disorder    patient reports anxiety most of her life   Paralyzed vocal cords 1993   30  years ago/ per pt unsure of cause" speaks in soft whispery tone is normal"   Scoliosis deformity of spine    very severe"can't lay flat with out cushion"- started as a young teenager   Tubular adenoma of colon 09/06/2004   Dr. Silvano Rusk    SURGICAL HISTORY: Past Surgical History:  Procedure Laterality Date   BREAST BIOPSY Right 03/15/2021   BREAST BIOPSY Right 04/18/2021   BREAST LUMPECTOMY WITH RADIOACTIVE SEED LOCALIZATION Right 05/01/2021   Procedure: RIGHT BREAST LUMPECTOMY WITH RADIOACTIVE SEED LOCALIZATION X4;  Surgeon: Erroll Luna, MD;  Location: [facility];  Service: General;  Laterality: Right;   CESAREAN SECTION     COLONOSCOPY  2022   Dr. Silvano Rusk   DILATION AND CURETTAGE OF UTERUS     ESOPHAGOGASTRODUODENOSCOPY  02/02/2003   Dr. Silvano Rusk   ESOPHAGOGASTRODUODENOSCOPY N/A 10/21/2015   Procedure: ESOPHAGOGASTRODUODENOSCOPY (EGD);  Surgeon: Milus Banister, MD;  Location: [facility];  Service: Endoscopy;  Laterality: N/A;   HIATAL HERNIA REPAIR     KNEE SURGERY Right    right fx- retained hardware   NISSEN FUNDOPLICATION  [id number]   SENTINEL NODE BIOPSY Right 05/01/2021   Procedure: SENTINEL NODE BIOPSY;  Surgeon: Erroll Luna, MD;  Location: [facility];  Service: General;  Laterality: Right;   TONSILLECTOMY  1953   age 41   TUBAL LIGATION     UPPER GASTROINTESTINAL ENDOSCOPY     vocal cord surgery     "did improve condition"    SOCIAL HISTORY: Social History   Socioeconomic History   Marital status: Married    Spouse name: Not on file   Number of children: 1   Years of education: Not on file   Highest education level: Not on file  Occupational History   Not on file  Tobacco Use   Smoking status: Never   Smokeless tobacco: Never  Vaping Use   Vaping Use: Never used  Substance and Sexual Activity   Alcohol use: No   Drug use: No   Sexual activity: Yes    Birth control/protection: None    Comment: Post menopause  Other Topics Concern   Not on file   Social History Narrative   Not on file   Social Determinants of Health   Financial Resource Strain: Not on file  Food Insecurity: Not on file  Transportation Needs: Not on file  Physical Activity: Not on file  Stress: Not on file  Social Connections: Not on file  Intimate Partner Violence: Not on file    FAMILY HISTORY: Family History  Problem Relation Age of Onset   Heart disease Mother    Diabetes Mother    Melanoma Father    Heart disease Father    Arthritis Father    Heart disease Sister    Skin cancer Brother        patient unsure of what type of skin cancer brother had   Colon polyps Brother    Colon cancer Maternal Grandmother 41   Cancer Maternal Aunt 65       sinus cancer   Esophageal cancer Neg Hx    Rectal cancer Neg Hx  Stomach cancer Neg Hx    Breast cancer Neg Hx     Review of Systems  Constitutional:  Negative for appetite change, chills, fatigue, fever and unexpected weight change.  HENT:   Negative for hearing loss, lump/mass and trouble swallowing.   Eyes:  Negative for eye problems and icterus.  Respiratory:  Negative for chest tightness, cough and shortness of breath.   Cardiovascular:  Negative for chest pain, leg swelling and palpitations.  Gastrointestinal:  Negative for abdominal distention, abdominal pain, constipation, diarrhea, nausea and vomiting.  Endocrine: Negative for hot flashes.  Genitourinary:  Negative for difficulty urinating.   Musculoskeletal:  Negative for arthralgias.  Skin:  Negative for itching and rash.  Neurological:  Negative for dizziness, extremity weakness, headaches and numbness.  Hematological:  Negative for adenopathy. Does not bruise/bleed easily.  Psychiatric/Behavioral:  Negative for depression. The patient is not nervous/anxious.       PHYSICAL EXAMINATION  ECOG PERFORMANCE STATUS: 0 - Asymptomatic  Vitals:   03/27/22 1059  BP: 133/80  Pulse: 78  Resp: 14  Temp: 97.8 F (36.6 C)  SpO2: 98%     Physical Exam Constitutional:      General: She is not in acute distress.    Appearance: Normal appearance. She is not toxic-appearing.  HENT:     Head: Normocephalic and atraumatic.  Eyes:     General: No scleral icterus. Cardiovascular:     Rate and Rhythm: Normal rate and regular rhythm.     Pulses: Normal pulses.     Heart sounds: Normal heart sounds.  Pulmonary:     Effort: Pulmonary effort is normal.     Breath sounds: Normal breath sounds.  Chest:     Comments: Right breast status postlumpectomy and radiation no sign of local recurrence, left breast is benign. Abdominal:     General: Abdomen is flat. Bowel sounds are normal. There is no distension.     Palpations: Abdomen is soft.     Tenderness: There is no abdominal tenderness.  Musculoskeletal:        General: No swelling.     Cervical back: Neck supple.  Lymphadenopathy:     Cervical: No cervical adenopathy.  Skin:    General: Skin is warm and dry.     Findings: No rash.  Neurological:     General: No focal deficit present.     Mental Status: She is alert.  Psychiatric:        Mood and Affect: Mood normal.        Behavior: Behavior normal.     LABORATORY DATA:  None for this visit   ASSESSMENT and THERAPY PLAN:   Malignant neoplasm of overlapping sites of right breast in female, estrogen receptor positive ([place]) Jacqueline Ortiz is a 77 year old woman with stage Ia ER/PR positive right-sided breast cancer status post lumpectomy followed by adjuvant radiation and opted to forego antiestrogen therapy.  He is continued on observation alone since June 2023.  She has no clinical or radiographic signs of breast cancer recurrence.  She will continue to undergo annual mammograms which will be due again in January 2025.  We discussed her surveillance recommendation which include a clinical breast exam every 6 months.  I suggested she see Dr. Brantley Stage in 6 months and then I can see her 6 months after that.  I recommended  she continue to follow-up with her primary care provider and stay up-to-date with her health maintenance as she has been.  I encouraged her to continue  her exercise as she has been.  She will return in 1 year for follow-up.  She knows to call for any questions or concerns that may arise between now and then.   All questions were answered. The patient knows to call the clinic with any problems, questions or concerns. We can certainly see the patient much sooner if necessary.  Total encounter time:20 minutes*in face-to-face visit time, chart review, lab review, care coordination, order entry, and documentation of the encounter time.    Wilber Bihari, NP 03/27/22 11:22 AM Medical Oncology and Hematology [facility] [place], [place] [address] Tel. [phone]    Fax. [phone]  *Total Encounter Time as defined by the Centers for Medicare and Medicaid Services includes, in addition to the face-to-face time of a patient visit (documented in the note above) non-face-to-face time: obtaining and reviewing outside history, ordering and reviewing medications, tests or procedures, care coordination (communications with other health care professionals or caregivers) and documentation in the medical record.

## 2022-03-27 NOTE — Assessment & Plan Note (Signed)
Jacqueline Ortiz is a 77 year old woman with stage Ia ER/PR positive right-sided breast cancer status post lumpectomy followed by adjuvant radiation and opted to forego antiestrogen therapy.  He is continued on observation alone since June 2023.  She has no clinical or radiographic signs of breast cancer recurrence.  She will continue to undergo annual mammograms which will be due again in January 2025.  We discussed her surveillance recommendation which include a clinical breast exam every 6 months.  I suggested she see Dr. Brantley Stage in 6 months and then I can see her 6 months after that.  I recommended she continue to follow-up with her primary care provider and stay up-to-date with her health maintenance as she has been.  I encouraged her to continue her exercise as she has been.  She will return in 1 year for follow-up.  She knows to call for any questions or concerns that may arise between now and then.

## 2022-04-03 ENCOUNTER — Ambulatory Visit (INDEPENDENT_AMBULATORY_CARE_PROVIDER_SITE_OTHER): Payer: Medicare Other | Admitting: Obstetrics and Gynecology

## 2022-04-03 ENCOUNTER — Encounter: Payer: Self-pay | Admitting: Obstetrics and Gynecology

## 2022-04-03 VITALS — BP 114/81 | HR 81

## 2022-04-03 DIAGNOSIS — N952 Postmenopausal atrophic vaginitis: Secondary | ICD-10-CM | POA: Diagnosis not present

## 2022-04-03 DIAGNOSIS — R3915 Urgency of urination: Secondary | ICD-10-CM

## 2022-04-03 DIAGNOSIS — Z4689 Encounter for fitting and adjustment of other specified devices: Secondary | ICD-10-CM

## 2022-04-03 DIAGNOSIS — N813 Complete uterovaginal prolapse: Secondary | ICD-10-CM | POA: Diagnosis not present

## 2022-04-03 NOTE — Progress Notes (Signed)
[facility]   Subjective:     Chief Complaint:  Chief Complaint  Patient presents with   Pessary Check    Jacqueline Ortiz is a 77 y.o. female is here for pessary check.   History of Present Illness: Jacqueline Ortiz is a 77 y.o. female with stage IV pelvic organ prolapse who presents for a pessary check. She is using a size XL Inflatoball pessary. The pessary has been working well and she has no complaints. She is using vaginal estrogen. She denies vaginal bleeding.  Past Medical History: Patient  has a past medical history of Allergic rhinosinusitis (2008), Arthritis (2023), Breast cancer ([place]), Diverticulosis, Dry eyes, bilateral (2000), Duodenal diverticulum, Dyslipidemia (2008), Eczema (1993), Esophageal stricture (2008), Hemorrhoids (2003), History of hiatal hernia, Hyperlipidemia (2008), Hypertension (2021), Nephrolithiasis (2017), Osteoporosis (2003), Panic disorder, Paralyzed vocal cords (1993), Scoliosis deformity of spine, and Tubular adenoma of colon (09/06/2004).   Past Surgical History: She  has a past surgical history that includes Tubal ligation; Colonoscopy (2022); Cesarean section; Dilation and curettage of uterus; Knee surgery (Right); vocal cord surgery; Esophagogastroduodenoscopy (02/02/2003); Tonsillectomy (1953); Esophagogastroduodenoscopy (N/A, 10/21/2015); Upper gastrointestinal endoscopy; Breast biopsy (Right, 03/15/2021); Breast biopsy (Right, 04/18/2021); Hiatal hernia repair; Nissen fundoplication (99991111); Breast lumpectomy with radioactive seed localization (Right, 05/01/2021); and Sentinel node biopsy (Right, 05/01/2021).   Medications: She has a current medication list which includes the following prescription(s): acetaminophen, atorvastatin, calcium-magnesium-vitamin d, estradiol, ketotifen fumarate, loratadine, preparation h, and triamterene-hydrochlorothiazide.   Allergies: Patient has No Known Allergies.   Social History: Patient  reports that she  has never smoked. She has never used smokeless tobacco. She reports that she does not drink alcohol and does not use drugs.      Objective:    Physical Exam: BP 114/81   Pulse 81  Gen: No apparent distress, A&O x 3. Detailed Urogynecologic Evaluation:  Pelvic Exam: Normal external female genitalia; Bartholin's and Skene's glands normal in appearance; urethral meatus normal in appearance, no urethral masses or discharge. The pessary was noted to be in place. It was removed and cleaned. Speculum exam revealed no lesions in the vagina. The pessary was replaced. It was comfortable to the patient and fit well.   Assessment/Plan:    Assessment: Jacqueline Ortiz is a 77 y.o. with stage IV pelvic organ prolapse and OAB here for a pessary check. She is doing well.  Plan: She will remove once a week . She will continue to use estrogen. She will follow-up in 6 months for a pessary check or sooner as needed.   We discussed that if her bladder urgency is bothering her we could consider starting a medication but she reports these symptoms are not too bothersome for her. She also has some leaking with cough and sneeze and we discussed there are some treatment options we could explore in the future for that as well if she wanted to.   All questions were answered.

## 2022-04-04 ENCOUNTER — Ambulatory Visit: Payer: Medicare Other | Admitting: Obstetrics and Gynecology

## 2022-04-10 ENCOUNTER — Telehealth: Payer: Self-pay | Admitting: Radiology

## 2022-04-10 NOTE — Telephone Encounter (Signed)
MTG-015 - Tissue and Bodily Fluids: Translational Medicine: Discovery and Evaluation of Biomarkers/Pharmacogenomics for the Diagnosis and Personalized Management of Patients   04/10/2022  PHONE CALL: Confirmed I was speaking with Jacqueline Ortiz. Informed patient reason for call is a 1-yr follow-up phone call for the above mentioned study. Patient confirmed she has had no new medical conditions in the past year and no medical conditions that have resolved. Patient medication list has been reviewed. Patient has 1 new medication--Estradiol and is no longer taking Escitalopram or Lorazepam (both have been discontinued for greater than 30 days). Patient was informed she will receive a $50 gift card as a thank you for continuing to support the above mentioned study. Patient has no questions or concerns at this time.  Informed patient, this coordinator will call once the gift card has been mailed and this coordinator confirmed mailing address with patient.   Carol Ada, RT(R)(T) Clinical Research Coordinator

## 2022-05-25 ENCOUNTER — Encounter (HOSPITAL_COMMUNITY): Payer: Self-pay | Admitting: *Deleted

## 2022-05-25 ENCOUNTER — Other Ambulatory Visit: Payer: Self-pay

## 2022-05-25 ENCOUNTER — Inpatient Hospital Stay (HOSPITAL_COMMUNITY)
Admission: EM | Admit: 2022-05-25 | Discharge: 2022-05-27 | DRG: 194 | Disposition: A | Discharge status: Home / Self Care | Payer: Medicare Other | Source: Ambulatory Visit | Attending: Internal Medicine | Admitting: Internal Medicine

## 2022-05-25 ENCOUNTER — Encounter (HOSPITAL_COMMUNITY): Payer: Self-pay

## 2022-05-25 ENCOUNTER — Emergency Department (HOSPITAL_COMMUNITY): Payer: Medicare Other

## 2022-05-25 ENCOUNTER — Ambulatory Visit (HOSPITAL_COMMUNITY)
Admission: EM | Admit: 2022-05-25 | Discharge: 2022-05-25 | Disposition: A | Discharge status: Home / Self Care | Payer: Medicare Other

## 2022-05-25 DIAGNOSIS — I10 Essential (primary) hypertension: Secondary | ICD-10-CM | POA: Diagnosis not present

## 2022-05-25 DIAGNOSIS — J9601 Acute respiratory failure with hypoxia: Secondary | ICD-10-CM

## 2022-05-25 DIAGNOSIS — K219 Gastro-esophageal reflux disease without esophagitis: Secondary | ICD-10-CM | POA: Diagnosis not present

## 2022-05-25 DIAGNOSIS — Z1152 Encounter for screening for COVID-19: Secondary | ICD-10-CM

## 2022-05-25 DIAGNOSIS — Z8249 Family history of ischemic heart disease and other diseases of the circulatory system: Secondary | ICD-10-CM

## 2022-05-25 DIAGNOSIS — Z833 Family history of diabetes mellitus: Secondary | ICD-10-CM

## 2022-05-25 DIAGNOSIS — E860 Dehydration: Secondary | ICD-10-CM | POA: Diagnosis present

## 2022-05-25 DIAGNOSIS — R0602 Shortness of breath: Secondary | ICD-10-CM | POA: Diagnosis not present

## 2022-05-25 DIAGNOSIS — J189 Pneumonia, unspecified organism: Secondary | ICD-10-CM | POA: Diagnosis not present

## 2022-05-25 DIAGNOSIS — E785 Hyperlipidemia, unspecified: Secondary | ICD-10-CM

## 2022-05-25 DIAGNOSIS — K222 Esophageal obstruction: Secondary | ICD-10-CM

## 2022-05-25 DIAGNOSIS — Z853 Personal history of malignant neoplasm of breast: Secondary | ICD-10-CM

## 2022-05-25 DIAGNOSIS — C50811 Malignant neoplasm of overlapping sites of right female breast: Secondary | ICD-10-CM

## 2022-05-25 DIAGNOSIS — Z802 Family history of malignant neoplasm of other respiratory and intrathoracic organs: Secondary | ICD-10-CM

## 2022-05-25 DIAGNOSIS — Z170 Estrogen receptor positive status [ER+]: Secondary | ICD-10-CM

## 2022-05-25 DIAGNOSIS — Z931 Gastrostomy status: Secondary | ICD-10-CM

## 2022-05-25 DIAGNOSIS — Z923 Personal history of irradiation: Secondary | ICD-10-CM

## 2022-05-25 DIAGNOSIS — R531 Weakness: Secondary | ICD-10-CM

## 2022-05-25 DIAGNOSIS — E871 Hypo-osmolality and hyponatremia: Secondary | ICD-10-CM | POA: Diagnosis present

## 2022-05-25 DIAGNOSIS — E876 Hypokalemia: Secondary | ICD-10-CM | POA: Diagnosis present

## 2022-05-25 DIAGNOSIS — J159 Unspecified bacterial pneumonia: Secondary | ICD-10-CM | POA: Diagnosis not present

## 2022-05-25 DIAGNOSIS — M810 Age-related osteoporosis without current pathological fracture: Secondary | ICD-10-CM | POA: Diagnosis present

## 2022-05-25 DIAGNOSIS — Z800 Family history of malignant neoplasm of digestive organs: Secondary | ICD-10-CM

## 2022-05-25 DIAGNOSIS — Z808 Family history of malignant neoplasm of other organs or systems: Secondary | ICD-10-CM

## 2022-05-25 LAB — PROTIME-INR
INR: 1.2 (ref 0.8–1.2)
Prothrombin Time: 15 seconds (ref 11.4–15.2)

## 2022-05-25 LAB — CBC WITH DIFFERENTIAL/PLATELET
Abs Immature Granulocytes: 0.63 10*3/uL — ABNORMAL HIGH (ref 0.00–0.07)
Basophils Absolute: 0.1 10*3/uL (ref 0.0–0.1)
Basophils Relative: 1 %
Eosinophils Absolute: 0 10*3/uL (ref 0.0–0.5)
Eosinophils Relative: 0 %
HCT: 44.4 % (ref 36.0–46.0)
Hemoglobin: 15.2 g/dL — ABNORMAL HIGH (ref 12.0–15.0)
Immature Granulocytes: 3 %
Lymphocytes Relative: 5 %
Lymphs Abs: 0.9 10*3/uL (ref 0.7–4.0)
MCH: 29 pg (ref 26.0–34.0)
MCHC: 34.2 g/dL (ref 30.0–36.0)
MCV: 84.6 fL (ref 80.0–100.0)
Monocytes Absolute: 1.5 10*3/uL — ABNORMAL HIGH (ref 0.1–1.0)
Monocytes Relative: 8 %
Neutro Abs: 15.7 10*3/uL — ABNORMAL HIGH (ref 1.7–7.7)
Neutrophils Relative %: 83 %
Platelets: 364 10*3/uL (ref 150–400)
RBC: 5.25 MIL/uL — ABNORMAL HIGH (ref 3.87–5.11)
RDW: 13.9 % (ref 11.5–15.5)
WBC: 18.8 10*3/uL — ABNORMAL HIGH (ref 4.0–10.5)
nRBC: 0 % (ref 0.0–0.2)

## 2022-05-25 LAB — TROPONIN I (HIGH SENSITIVITY)
Troponin I (High Sensitivity): 23 ng/L — ABNORMAL HIGH (ref <18)
Troponin I (High Sensitivity): 13 ng/L (ref <18)

## 2022-05-25 LAB — COMPREHENSIVE METABOLIC PANEL
ALT: 44 U/L (ref 0–44)
AST: 30 U/L (ref 15–41)
Albumin: 3 g/dL — ABNORMAL LOW (ref 3.5–5.0)
Alkaline Phosphatase: 91 U/L (ref 38–126)
Anion gap: 15 (ref 5–15)
BUN: 10 mg/dL (ref 8–23)
CO2: 21 mmol/L — ABNORMAL LOW (ref 22–32)
Calcium: 9.5 mg/dL (ref 8.9–10.3)
Chloride: 95 mmol/L — ABNORMAL LOW (ref 98–111)
Creatinine, Ser: 0.92 mg/dL (ref 0.44–1.00)
GFR, Estimated: >60 mL/min (ref >60)
Glucose, Bld: 150 mg/dL — ABNORMAL HIGH (ref 70–99)
Potassium: 2.4 mmol/L — CL (ref 3.5–5.1)
Sodium: 131 mmol/L — ABNORMAL LOW (ref 135–145)
Total Bilirubin: 0.9 mg/dL (ref 0.3–1.2)
Total Protein: 7.6 g/dL (ref 6.5–8.1)

## 2022-05-25 LAB — LACTIC ACID, PLASMA
Lactic Acid, Venous: 1.3 mmol/L (ref 0.5–1.9)
Lactic Acid, Venous: 1.1 mmol/L (ref 0.5–1.9)

## 2022-05-25 LAB — MAGNESIUM: Magnesium: 1.9 mg/dL (ref 1.7–2.4)

## 2022-05-25 LAB — SARS CORONAVIRUS 2 BY RT PCR: SARS Coronavirus 2 by RT PCR: NEGATIVE

## 2022-05-25 MED ORDER — ACETAMINOPHEN 325 MG PO TABS
650.0000 mg | ORAL_TABLET | Freq: Four times a day (QID) | ORAL | Status: DC | PRN
  Administered 2022-05-25: 650 mg via ORAL
  Filled 2022-05-25: qty 2

## 2022-05-25 MED ORDER — SODIUM CHLORIDE 0.9 % IV SOLN
500.0000 mg | INTRAVENOUS | Status: DC
  Administered 2022-05-26 – 2022-05-27 (×2): 500 mg via INTRAVENOUS
  Filled 2022-05-25 (×2): qty 5

## 2022-05-25 MED ORDER — TRIAMTERENE-HCTZ 37.5-25 MG PO TABS
1.0000 | ORAL_TABLET | Freq: Every morning | ORAL | Status: DC
  Administered 2022-05-26: 1 via ORAL
  Filled 2022-05-25: qty 1

## 2022-05-25 MED ORDER — ATORVASTATIN CALCIUM 10 MG PO TABS
20.0000 mg | ORAL_TABLET | Freq: Every morning | ORAL | Status: DC
  Filled 2022-05-25: qty 2

## 2022-05-25 MED ORDER — SODIUM CHLORIDE 0.9% FLUSH
3.0000 mL | Freq: Two times a day (BID) | INTRAVENOUS | Status: DC
  Administered 2022-05-25 – 2022-05-27 (×5): 3 mL via INTRAVENOUS

## 2022-05-25 MED ORDER — ACETAMINOPHEN 650 MG RE SUPP: 650.0000 mg | Freq: Four times a day (QID) | RECTAL | Status: DC | PRN

## 2022-05-25 MED ORDER — MAGNESIUM SULFATE 2 GM/50ML IV SOLN
2.0000 g | Freq: Once | INTRAVENOUS | Status: AC
  Administered 2022-05-25: 2 g via INTRAVENOUS
  Filled 2022-05-25: qty 50

## 2022-05-25 MED ORDER — POLYETHYLENE GLYCOL 3350 17 G PO PACK: 17.0000 g | PACK | Freq: Every day | ORAL | Status: DC | PRN

## 2022-05-25 MED ORDER — ENOXAPARIN SODIUM 40 MG/0.4ML IJ SOSY
40.0000 mg | PREFILLED_SYRINGE | INTRAMUSCULAR | Status: DC
  Administered 2022-05-25 – 2022-05-26 (×2): 40 mg via SUBCUTANEOUS
  Filled 2022-05-25 (×2): qty 0.4

## 2022-05-25 MED ORDER — POTASSIUM CHLORIDE 10 MEQ/100ML IV SOLN
10.0000 meq | INTRAVENOUS | Status: AC
  Administered 2022-05-25 (×2): 10 meq via INTRAVENOUS

## 2022-05-25 MED ORDER — SODIUM CHLORIDE 0.9 % IV SOLN
500.0000 mg | Freq: Once | INTRAVENOUS | Status: AC
  Administered 2022-05-25: 500 mg via INTRAVENOUS
  Filled 2022-05-25: qty 5

## 2022-05-25 MED ORDER — LACTATED RINGERS IV BOLUS
1000.0000 mL | Freq: Once | INTRAVENOUS | Status: AC
  Administered 2022-05-25: 1000 mL via INTRAVENOUS

## 2022-05-25 MED ORDER — SODIUM CHLORIDE 0.9 % IV SOLN
2.0000 g | Freq: Once | INTRAVENOUS | Status: AC
  Administered 2022-05-25: 2 g via INTRAVENOUS
  Filled 2022-05-25: qty 20

## 2022-05-25 MED ORDER — POTASSIUM CHLORIDE 20 MEQ PO PACK
60.0000 meq | PACK | Freq: Once | ORAL | Status: AC
  Administered 2022-05-25: 60 meq via ORAL
  Filled 2022-05-25: qty 3

## 2022-05-25 MED ORDER — GUAIFENESIN ER 600 MG PO TB12
600.0000 mg | ORAL_TABLET | Freq: Two times a day (BID) | ORAL | Status: DC
  Administered 2022-05-25 – 2022-05-27 (×5): 600 mg via ORAL
  Filled 2022-05-25 (×5): qty 1

## 2022-05-25 MED ORDER — POTASSIUM CHLORIDE 10 MEQ/100ML IV SOLN
10.0000 meq | INTRAVENOUS | Status: AC
  Administered 2022-05-25: 10 meq via INTRAVENOUS
  Filled 2022-05-25 (×2): qty 100

## 2022-05-25 MED ORDER — SODIUM CHLORIDE 0.9 % IV SOLN
2.0000 g | INTRAVENOUS | Status: DC
  Administered 2022-05-26 – 2022-05-27 (×2): 2 g via INTRAVENOUS
  Filled 2022-05-25 (×2): qty 20

## 2022-05-25 NOTE — ED Notes (Signed)
Critical potassium of 2.4 called from lab. Rhunette Croft and [place] PA notified.

## 2022-05-25 NOTE — ED Triage Notes (Signed)
Pt sent her from UC for further treatment of cough.  Monday, pt was prescribed and has been taking, cefdinir, steroids and phenergan. Pt states the last two days she has been getting worse.  Pt feels weak and coughing has increased.

## 2022-05-25 NOTE — Sepsis Progress Note (Signed)
Sepsis protocol is being followed by [facility]. 

## 2022-05-25 NOTE — ED Provider Notes (Signed)
[place] EMERGENCY DEPARTMENT AT [facility] Provider Note   CSN: [id number] Arrival date & time: 05/25/22  1235     History  Chief Complaint  Patient presents with   Cough    Jacqueline Ortiz is a 77 y.o. female.   Cough    Patient with medical history of allergies, hyperlipidemia, breast cancer status postlumpectomy presents to the emergency department due to cough.  Symptoms started about a week ago, she was seen by her primary doctor on Monday and started on Phenergan, cefdinir and steroid taper.  She felt better for a day but since then has been worsening.  She feels very short of breath, no chest pain but feels weak and dehydrated.  Went to urgent care for evaluation was found to be hypoxic 87% and tachycardic so sent to ED for further evaluation.  Patient is never smoked cigarettes, no history of asthma or COPD.  Not currently on chemo or radiation.  No history of PE or ACS.  She denies any chest pain.  Home Medications Prior to Admission medications   Medication Sig Start Date End Date Taking? Authorizing Provider  predniSONE (DELTASONE) 10 MG tablet 1 tablet Orally twice a day with food for 5 days 05/19/22  Yes [provider]  promethazine-dextromethorphan (PROMETHAZINE-DM) 6.25-15 MG/5ML syrup 5 mL as needed Orally every 6 hrs for cough , caution on sedation for 10 days 05/19/22  Yes [provider]  acetaminophen (TYLENOL) 325 MG tablet Take 325 mg by mouth 2 (two) times daily.    [provider]  atorvastatin (LIPITOR) 20 MG tablet Take 20 mg by mouth in the morning. 09/05/15   [provider]  Calcium-Magnesium-Vitamin D 500-50-100 MG-MG-UNIT CHEW Chew 2 capsules by mouth 2 (two) times daily.    [provider]  cefdinir (OMNICEF) 300 MG capsule as directed Orally at breakfast and dinner for 10 days 05/19/22   [provider]  estradiol (ESTRACE) 0.1 MG/GM vaginal cream Place 0.5 g vaginally 2 (two) times a week.  Place 0.5g nightly for two weeks then twice a week after 01/06/22   Selmer Dominion, NP  Ketotifen Fumarate (REFRESH EYE ITCH RELIEF OP) Place 1 drop into both eyes 4 (four) times daily as needed (eye irritation.).    [provider]  loratadine (CLARITIN) 10 MG tablet Take 10 mg by mouth in the morning.    [provider]  Phenylephrine-Witch Hazel (PREPARATION H) 0.25-50 % GEL Apply 1 application. topically as needed (irritation/discomfort).    [provider]  triamterene-hydrochlorothiazide (MAXZIDE-25) 37.5-25 MG tablet Take 1 tablet by mouth in the morning. 07/17/20   [provider]      Allergies    Patient has no known allergies.    Review of Systems   Review of Systems  Respiratory:  Positive for cough.     Physical Exam Updated Vital Signs BP 120/81 (BP Location: Right Arm)   Pulse (!) 116   Temp 98.5 F (36.9 C) (Oral)   Resp (!) 30   Ht 5' 1.5" (1.562 m)   Wt 60.2 kg   SpO2 90%   BMI 24.67 kg/m  Physical Exam Vitals and nursing note reviewed. Exam conducted with a chaperone present.  Constitutional:      Appearance: Normal appearance. She is ill-appearing.  HENT:     Head: Normocephalic and atraumatic.     Mouth/Throat:     Mouth: Mucous membranes are dry.  Eyes:     General: No  scleral icterus.       Right eye: No discharge.        Left eye: No discharge.     Extraocular Movements: Extraocular movements intact.     Pupils: Pupils are equal, round, and reactive to light.  Cardiovascular:     Rate and Rhythm: Regular rhythm. Tachycardia present.     Pulses: Normal pulses.     Heart sounds: Normal heart sounds.     No friction rub. No gallop.  Pulmonary:     Effort: Pulmonary effort is normal. Tachypnea present. No respiratory distress.     Breath sounds: Examination of the right-upper field reveals decreased breath sounds. Examination of the right-middle field reveals decreased breath sounds. Examination of the  right-lower field reveals decreased breath sounds. Decreased breath sounds present.  Abdominal:     General: Abdomen is flat. Bowel sounds are normal. There is no distension.     Palpations: Abdomen is soft.     Tenderness: There is no abdominal tenderness.  Skin:    General: Skin is warm and dry.     Coloration: Skin is not jaundiced.  Neurological:     Mental Status: She is alert. Mental status is at baseline.     Coordination: Coordination normal.     ED Results / Procedures / Treatments   Labs (all labs ordered are listed, but only abnormal results are displayed) Labs Reviewed  COMPREHENSIVE METABOLIC PANEL - Abnormal; Notable for the following components:      Result Value   Sodium 131 (*)    Potassium 2.4 (*)    Chloride 95 (*)    CO2 21 (*)    Glucose, Bld 150 (*)    Albumin 3.0 (*)    All other components within normal limits  CBC WITH DIFFERENTIAL/PLATELET - Abnormal; Notable for the following components:   WBC 18.8 (*)    RBC 5.25 (*)    Hemoglobin 15.2 (*)    Neutro Abs 15.7 (*)    Monocytes Absolute 1.5 (*)    Abs Immature Granulocytes 0.63 (*)    All other components within normal limits  TROPONIN I (HIGH SENSITIVITY) - Abnormal; Notable for the following components:   Troponin I (High Sensitivity) 23 (*)    All other components within normal limits  CULTURE, BLOOD (ROUTINE X 2)  CULTURE, BLOOD (ROUTINE X 2)  SARS CORONAVIRUS 2 BY RT PCR  LACTIC ACID, PLASMA  PROTIME-INR  LACTIC ACID, PLASMA  URINALYSIS, ROUTINE W REFLEX MICROSCOPIC  MAGNESIUM  TROPONIN I (HIGH SENSITIVITY)    EKG None  Radiology DG Chest Portable 1 View  Result Date: 05/25/2022 CLINICAL DATA:  Cough, short of breath EXAM: PORTABLE CHEST 1 VIEW COMPARISON:  10/21/2015 FINDINGS: Single frontal view of the chest demonstrates an unremarkable cardiac silhouette. There is bibasilar airspace disease, right greater than left, consistent with pneumonia. No significant effusion or  pneumothorax. No acute bony abnormalities. IMPRESSION: 1. Bibasilar airspace disease, right greater than left, consistent with pneumonia. Followup PA and lateral chest X-ray is recommended in 3-4 weeks following trial of antibiotic therapy to ensure resolution and exclude underlying malignancy. Electronically Signed   By: Sharlet Salina M.D.   On: 05/25/2022 14:10    Procedures Procedures    Medications Ordered in ED Medications  cefTRIAXone (ROCEPHIN) 2 g in sodium chloride 0.9 % 100 mL IVPB (2 g Intravenous New Bag/Given 05/25/22 1424)  azithromycin (ZITHROMAX) 500 mg in sodium chloride 0.9 % 250 mL IVPB (has no administration in time  range)  magnesium sulfate IVPB 2 g 50 mL (has no administration in time range)  potassium chloride 10 mEq in 100 mL IVPB (has no administration in time range)  atorvastatin (LIPITOR) tablet 20 mg (has no administration in time range)  triamterene-hydrochlorothiazide (MAXZIDE-25) 37.5-25 MG per tablet 1 tablet (has no administration in time range)  cefTRIAXone (ROCEPHIN) 2 g in sodium chloride 0.9 % 100 mL IVPB (has no administration in time range)  azithromycin (ZITHROMAX) 500 mg in sodium chloride 0.9 % 250 mL IVPB (has no administration in time range)  enoxaparin (LOVENOX) injection 40 mg (has no administration in time range)  sodium chloride flush (NS) 0.9 % injection 3 mL (has no administration in time range)  acetaminophen (TYLENOL) tablet 650 mg (has no administration in time range)    Or  acetaminophen (TYLENOL) suppository 650 mg (has no administration in time range)  polyethylene glycol (MIRALAX / GLYCOLAX) packet 17 g (has no administration in time range)  potassium chloride (KLOR-CON) packet 60 mEq (has no administration in time range)  lactated ringers bolus 1,000 mL (1,000 mLs Intravenous New Bag/Given 05/25/22 1423)    ED Course/ Medical Decision Making/ A&P                             Medical Decision Making Amount and/or Complexity of Data  Reviewed Labs: ordered. Radiology: ordered.  Risk Prescription drug management. Decision regarding hospitalization.   Patient is 77 year old female presenting to the emergency department due to cough and shortness of breath.  Differential includes pneumonia, PE, ACS, pericarditis, CHF, bronchitis, viral URI.  I have show medical records and previous urgent care visit where patient was hypoxic to 87% on room air.  Patient is in sinus tachycardia on cardiac monitoring without any specific ischemic changes.  Patient technically meets SIRS criteria of being tachycardic, tachypneic and with a white count of 18.8, sepsis activated and empiric Rocephin ordered for suspected respiratory source.  Troponin is slightly elevated at 24 which I think is likely secondary to demand ischemia given the absence of chest pain or EKG changes.  Patient lactic acid is within normal limits, blood cultures are pending.  Patient is notably very hypokalemic with a potassium of 2.4, mag levels checked and will empirically replace and supplement potassium.  Chest x-ray is notable for pneumonia worse in the right side but actually bilateral.  Azithromycin ordered in addition to the Rocephin.  Patient will need admission for acute respiratory failure with hypoxia secondary to pneumonia with failed outpatient trial of antibiotics and hypokalemia.  I will consult hospitalist for admission.        Final Clinical Impression(s) / ED Diagnoses Final diagnoses:  Community acquired pneumonia of right lung, unspecified part of lung  Acute respiratory failure with hypoxia (HCC)  Hypokalemia    Rx / DC Orders ED Discharge Orders     None         Theron Arista, PA-C 05/25/22 1454    Derwood Kaplan, MD 05/29/22 [phone]

## 2022-05-25 NOTE — H&P (Addendum)
History and Physical   Jacqueline Ortiz ZOX:[id number] DOB: [birth year] DOA: 05/25/2022  PCP: Geoffry Paradise, MD  For Patient coming from: Home/urgent care  Chief Complaint: Cough, shortness of breath  HPI: Jacqueline Ortiz is a 77 y.o. female with medical history significant of GERD, status post Nissen fundoplication, hypertension, hyperlipidemia, breast cancer, paralyzed vocal cord, scoliosis presenting with cough and shortness of breath.  Patient reports 1 week of cough.  Saw her PCP on Monday and was started on cefdinir and steroid taper.  She felt better for about a day but then her symptoms began to worsen again.  Has had worsening shortness of breath and generalized weakness.  Was seen in urgent care earlier today and noted to be saturating at 87% and tachycardic so she was sent to the ED for further evaluation.  She reports subjective fevers and chills but has not checked her temperature at home. Denies fevers, chills, chest pain, abdominal pain, constipation, diarrhea, nausea, vomiting.  ED Course: Signs in the ED notable for heart rate initially in the 110s but improved to the 90s with IV fluids.  Respiratory rate in the teens to 20s.  Saturating around 90 to 91% on room air.  Lab workup included CMP with sodium 131, potassium 3.4, chloride 95, bicarb 21, glucose 150, albumin 3.0.  CBC with leukocytosis to 18.8 and hemoglobin 15.2.  PT and INR within normal limits.  Lactic acid normal with repeat pending.  Troponin mildly elevated at 23 with repeat pending.  COVID screening pending.  Urinalysis and blood cultures also pending.  Chest x-ray showed bibasilar airspace disease right greater than left consistent with pneumonia.  Patient started on ceftriaxone and azithromycin.  She received a liter of IV fluids, 2 g magnesium, 30 mill equivalents IV potassium in the ED.  Review of Systems: As per HPI otherwise all other systems reviewed and are negative.  Past Medical History:  Diagnosis Date    Allergic rhinosinusitis 2008   usual daily sinus drainage and post nasal drip   Arthritis 2023   osteoarthritis   Breast cancer (HCC)    Diverticulosis    patient unsure of date of diagnosis   Dry eyes, bilateral 2000   Duodenal diverticulum    patient unsure of diagnosis date   Dyslipidemia 2008   Eczema 1993   Esophageal stricture 2008   Hemorrhoids 2003   internal and external- remains- treats as needed with topical gels   History of hiatal hernia    Hyperlipidemia 2008   Hypertension 2021   Nephrolithiasis 2017   Osteoporosis 2003   Panic disorder    patient reports anxiety most of her life   Paralyzed vocal cords 1993   30 years ago/ per pt unsure of cause" speaks in soft whispery tone is normal"   Scoliosis deformity of spine    very severe"can't lay flat with out cushion"- started as a young teenager   Tubular adenoma of colon 09/06/2004   Dr. Stan Head    Past Surgical History:  Procedure Laterality Date   BREAST BIOPSY Right 03/15/2021   BREAST BIOPSY Right 04/18/2021   BREAST LUMPECTOMY WITH RADIOACTIVE SEED LOCALIZATION Right 05/01/2021   Procedure: RIGHT BREAST LUMPECTOMY WITH RADIOACTIVE SEED LOCALIZATION X4;  Surgeon: Harriette Bouillon, MD;  Location: [facility] OR;  Service: General;  Laterality: Right;   CESAREAN SECTION     COLONOSCOPY  2022   Dr. Stan Head   DILATION AND CURETTAGE OF UTERUS     ESOPHAGOGASTRODUODENOSCOPY  02/02/2003  Dr. Stan Head   ESOPHAGOGASTRODUODENOSCOPY N/A 10/21/2015   Procedure: ESOPHAGOGASTRODUODENOSCOPY (EGD);  Surgeon: Rachael Fee, MD;  Location: [facility];  Service: Endoscopy;  Laterality: N/A;   HIATAL HERNIA REPAIR     KNEE SURGERY Right    right fx- retained hardware   NISSEN FUNDOPLICATION  2018   SENTINEL NODE BIOPSY Right 05/01/2021   Procedure: SENTINEL NODE BIOPSY;  Surgeon: Harriette Bouillon, MD;  Location: [facility] OR;  Service: General;  Laterality: Right;   TONSILLECTOMY  1953   age 36   TUBAL LIGATION      UPPER GASTROINTESTINAL ENDOSCOPY     vocal cord surgery     "did improve condition"    Social History  reports that she has never smoked. She has never used smokeless tobacco. She reports that she does not drink alcohol and does not use drugs.  No Known Allergies  Family History  Problem Relation Age of Onset   Heart disease Mother    Diabetes Mother    Melanoma Father    Heart disease Father    Arthritis Father    Heart disease Sister    Skin cancer Brother        patient unsure of what type of skin cancer brother had   Colon polyps Brother    Colon cancer Maternal Grandmother 17   Cancer Maternal Aunt 65       sinus cancer   Esophageal cancer Neg Hx    Rectal cancer Neg Hx    Stomach cancer Neg Hx    Breast cancer Neg Hx   Reviewed on admission  Prior to Admission medications   Medication Sig Start Date End Date Taking? Authorizing Provider  predniSONE (DELTASONE) 10 MG tablet 1 tablet Orally twice a day with food for 5 days 05/19/22  Yes [provider]  promethazine-dextromethorphan (PROMETHAZINE-DM) 6.25-15 MG/5ML syrup 5 mL as needed Orally every 6 hrs for cough , caution on sedation for 10 days 05/19/22  Yes [provider]  acetaminophen (TYLENOL) 325 MG tablet Take 325 mg by mouth 2 (two) times daily.    [provider]  atorvastatin (LIPITOR) 20 MG tablet Take 20 mg by mouth in the morning. 09/05/15   [provider]  Calcium-Magnesium-Vitamin D 500-50-100 MG-MG-UNIT CHEW Chew 2 capsules by mouth 2 (two) times daily.    [provider]  cefdinir (OMNICEF) 300 MG capsule as directed Orally at breakfast and dinner for 10 days 05/19/22   [provider]  estradiol (ESTRACE) 0.1 MG/GM vaginal cream Place 0.5 g vaginally 2 (two) times a week. Place 0.5g nightly for two weeks then twice a week after 01/06/22   Selmer Dominion, NP  Ketotifen Fumarate (REFRESH EYE ITCH RELIEF OP) Place 1 drop into both eyes 4 (four) times  daily as needed (eye irritation.).    [provider]  loratadine (CLARITIN) 10 MG tablet Take 10 mg by mouth in the morning.    [provider]  Phenylephrine-Witch Hazel (PREPARATION H) 0.25-50 % GEL Apply 1 application. topically as needed (irritation/discomfort).    [provider]  triamterene-hydrochlorothiazide (MAXZIDE-25) 37.5-25 MG tablet Take 1 tablet by mouth in the morning. 07/17/20   [provider]    Physical Exam: Vitals:   05/25/22 1240 05/25/22 1302 05/25/22 1304  BP: 120/81    Pulse: (!) 116    Resp: 18  (!) 30  Temp: 98.5 F (36.9 C)    TempSrc: Oral    SpO2: 90%  Weight:  60.2 kg   Height:  5' 1.5" (1.562 m)     Physical Exam Constitutional:      General: She is not in acute distress.    Appearance: Normal appearance.  HENT:     Head: Normocephalic and atraumatic.     Mouth/Throat:     Mouth: Mucous membranes are moist.     Pharynx: Oropharynx is clear.  Eyes:     Extraocular Movements: Extraocular movements intact.     Pupils: Pupils are equal, round, and reactive to light.  Cardiovascular:     Rate and Rhythm: Normal rate and regular rhythm.     Pulses: Normal pulses.     Heart sounds: Normal heart sounds.  Pulmonary:     Effort: Pulmonary effort is normal. No respiratory distress.     Breath sounds: Rhonchi present.  Abdominal:     General: Bowel sounds are normal. There is no distension.     Palpations: Abdomen is soft.     Tenderness: There is no abdominal tenderness.  Musculoskeletal:        General: No swelling or deformity.  Skin:    General: Skin is warm and dry.  Neurological:     General: No focal deficit present.     Mental Status: Mental status is at baseline.    Labs on Admission: I have personally reviewed following labs and imaging studies  CBC: Recent Labs  Lab 05/25/22 1318  WBC 18.8*  NEUTROABS 15.7*  HGB 15.2*  HCT 44.4  MCV 84.6  PLT 364    Basic Metabolic Panel: Recent  Labs  Lab 05/25/22 1318  NA 131*  K 2.4*  CL 95*  CO2 21*  GLUCOSE 150*  BUN 10  CREATININE 0.92  CALCIUM 9.5    GFR: Estimated Creatinine Clearance: 45.3 mL/min (by C-G formula based on SCr of 0.92 mg/dL).  Liver Function Tests: Recent Labs  Lab 05/25/22 1318  AST 30  ALT 44  ALKPHOS 91  BILITOT 0.9  PROT 7.6  ALBUMIN 3.0*    Urine analysis:    Component Value Date/Time   BILIRUBINUR Negative 12/05/2021 1501   PROTEINUR Negative 12/05/2021 1501   UROBILINOGEN 0.2 12/05/2021 1501   NITRITE Negative 12/05/2021 1501   LEUKOCYTESUR Large (3+) (A) 12/05/2021 1501    Radiological Exams on Admission: DG Chest Portable 1 View  Result Date: 05/25/2022 CLINICAL DATA:  Cough, short of breath EXAM: PORTABLE CHEST 1 VIEW COMPARISON:  10/21/2015 FINDINGS: Single frontal view of the chest demonstrates an unremarkable cardiac silhouette. There is bibasilar airspace disease, right greater than left, consistent with pneumonia. No significant effusion or pneumothorax. No acute bony abnormalities. IMPRESSION: 1. Bibasilar airspace disease, right greater than left, consistent with pneumonia. Followup PA and lateral chest X-ray is recommended in 3-4 weeks following trial of antibiotic therapy to ensure resolution and exclude underlying malignancy. Electronically Signed   By: Sharlet Salina M.D.   On: 05/25/2022 14:10    EKG: Independently reviewed.  Sinus tachycardia at 112 bpm.  Minimal baseline wander.  Assessment/Plan Principal Problem:   CAP (community acquired pneumonia) Active Problems:   GERD with stricture   S/P Nissen fundoplication (with gastrostomy tube placement) (HCC)   Malignant neoplasm of overlapping sites of right breast in female, estrogen receptor positive (HCC)   HTN (hypertension)   HLD (hyperlipidemia)   Continue acquired ammonia Generalized weakness > Presenting with worsening shortness of breath and general fatigue despite course of cefdinir and steroids  outpatient. > Evidence of  bilateral pneumonia right greater than left on chest x-ray.  Leukocytosis to 18.8.  Saturating 87% at urgent care but has been 90-91% on room air at rest in the ED. (May desaturate with exertion, not currently 9) > Received ceftriaxone and azithromycin and a liter of fluids in the ED. - Monitor on telemetry overnight as well as continuous pulse ox - Continue with ceftriaxone and azithromycin - Trend fever curve and WBC - Follow-up urine analysis, blood cultures - Supportive care - PT/OT eval and treat  Hypokalemia Hyponatremia > Noted to have potassium 2.4 in the ED.  Sodium 131 also noted. > Magnesium is pending in the ED, but did get empiric 2 g IV magnesium.  Also has been given 30 mill equivalents IV potassium. - Will add additional 60 mill equivalents p.o. potassium - Follow-up magnesium - Trend renal function and electrolytes  Hypertension - Continue triamterene-hydrochlorothiazide  Hyperlipidemia - Continue home atorvastatin  GERD > Status post Nissen fundoplication with gastrostomy tube - Noted  Breast cancer > Status post lumpectomy and radiation - Follows with oncology, last visit in February, on surveillance  History of paralyzed vocal cord Scoliosis - Noted  DVT prophylaxis: Lovenox Code Status:   Full Family Communication:  Updated at home  Disposition Plan:   Patient is from:  Home  Anticipated DC to:  Home  Anticipated DC date:  1 to 3 days  Anticipated DC barriers: None  Consults called:  None Admission status:  Observation, telemetry  Severity of Illness: The appropriate patient status for this patient is OBSERVATION. Observation status is judged to be reasonable and necessary in order to provide the required intensity of service to ensure the patient's safety. The patient's presenting symptoms, physical exam findings, and initial radiographic and laboratory data in the context of their medical condition is felt to place them  at decreased risk for further clinical deterioration. Furthermore, it is anticipated that the patient will be medically stable for discharge from the hospital within 2 midnights of admission.    Synetta Fail MD [facility]  How to contact the [facility] Attending or Consulting provider 7A - 7P or covering provider during after hours 7P -7A, for this patient?   Check the care team in [facility] and look for a) attending/consulting [facility] provider listed and b) the [facility] team listed Log into [email or web address] and use [facility] universal password to access. If you do not have the password, please contact the hospital operator. Locate the [facility] provider you are looking for under [facility] and page to a number that you can be directly reached. If you still have difficulty reaching the provider, please page the [facility] (Director on Call) for the Hospitalists listed on amion for assistance.  05/25/2022, 2:44 PM

## 2022-05-25 NOTE — ED Triage Notes (Signed)
Here for cough,congestion,fever and fatigue. Pt stated she was DX with Bronchitis 3 days ago.

## 2022-05-25 NOTE — ED Notes (Signed)
ED TO INPATIENT HANDOFF REPORT  ED Nurse Name and Phone #: Radene Journey Name/Age/Gender Jacqueline Ortiz 77 y.o. female Room/Bed: 5N32C/5N32C-01  Code Status   Code Status: Full Code  Home/SNF/Other Home Patient oriented to: self, place, time, and situation Is this baseline? Yes   Triage Complete: Triage complete  Chief Complaint Hypokalemia [E87.6] CAP (community acquired pneumonia) [J18.9] Acute respiratory failure with hypoxia (HCC) [J96.01] Community acquired pneumonia of right lung, unspecified part of lung [J18.9]  Triage Note Pt sent her from UC for further treatment of cough.  Monday, pt was prescribed and has been taking, cefdinir, steroids and phenergan. Pt states the last two days she has been getting worse.  Pt feels weak and coughing has increased.   Allergies No Known Allergies  Level of Care/Admitting Diagnosis ED Disposition     ED Disposition  Admit   Condition  --   Comment  Hospital Area: [facility] [[id number]]  Level of Care: Telemetry Medical [104]  May place patient in observation at [facility] or [facility] if equivalent level of care is available:: No  Covid Evaluation: Asymptomatic - no recent exposure (last 10 days) testing not required  Diagnosis: CAP (community acquired pneumonia) [161096]  Admitting Physician: Synetta Fail [[id number]]  Attending Physician: Synetta Fail [[id number]]          B Medical/Surgery History Past Medical History:  Diagnosis Date   Allergic rhinosinusitis 2008   usual daily sinus drainage and post nasal drip   Arthritis 2023   osteoarthritis   Breast cancer (HCC)    Diverticulosis    patient unsure of date of diagnosis   Dry eyes, bilateral 2000   Duodenal diverticulum    patient unsure of diagnosis date   Dyslipidemia 2008   Eczema 1993   Esophageal stricture 2008   Hemorrhoids 2003   internal and external- remains- treats as needed with topical gels   History of  hiatal hernia    Hyperlipidemia 2008   Hypertension 2021   Nephrolithiasis 2017   Osteoporosis 2003   Panic disorder    patient reports anxiety most of her life   Paralyzed vocal cords 1993   30 years ago/ per pt unsure of cause" speaks in soft whispery tone is normal"   Scoliosis deformity of spine    very severe"can't lay flat with out cushion"- started as a young teenager   Tubular adenoma of colon 09/06/2004   Dr. Stan Head   Past Surgical History:  Procedure Laterality Date   BREAST BIOPSY Right 03/15/2021   BREAST BIOPSY Right 04/18/2021   BREAST LUMPECTOMY WITH RADIOACTIVE SEED LOCALIZATION Right 05/01/2021   Procedure: RIGHT BREAST LUMPECTOMY WITH RADIOACTIVE SEED LOCALIZATION X4;  Surgeon: Harriette Bouillon, MD;  Location: [facility];  Service: General;  Laterality: Right;   CESAREAN SECTION     COLONOSCOPY  2022   Dr. Stan Head   DILATION AND CURETTAGE OF UTERUS     ESOPHAGOGASTRODUODENOSCOPY  02/02/2003   Dr. Stan Head   ESOPHAGOGASTRODUODENOSCOPY N/A 10/21/2015   Procedure: ESOPHAGOGASTRODUODENOSCOPY (EGD);  Surgeon: Rachael Fee, MD;  Location: [facility];  Service: Endoscopy;  Laterality: N/A;   HIATAL HERNIA REPAIR     KNEE SURGERY Right    right fx- retained hardware   NISSEN FUNDOPLICATION  2018   SENTINEL NODE BIOPSY Right 05/01/2021   Procedure: SENTINEL NODE BIOPSY;  Surgeon: Harriette Bouillon, MD;  Location: [facility] OR;  Service: General;  Laterality: Right;   TONSILLECTOMY  62   age 89   TUBAL LIGATION     UPPER GASTROINTESTINAL ENDOSCOPY     vocal cord surgery     "did improve condition"     A IV Location/Drains/Wounds Patient Lines/Drains/Airways Status     Active Line/Drains/Airways     Name Placement date Placement time Site Days   Peripheral IV 02/07/16 Right;Anterior Arm 02/07/16  1441  Arm  2299   Peripheral IV 05/25/22 Left Antecubital 05/25/22  1515  Antecubital  less than 1   Closed System Drain 1 Right;Lateral;Inferior Breast Bulb (JP)  19 Fr. 05/01/21  0956  Breast  389   Incision - 6 Ports Abdomen Right;Lateral Right;Lower;Lateral Mid;Umbilicus Left;Umbilicus Left;Lateral Upper;Mid 02/06/16  1300  -- 2300            Intake/Output Last 24 hours  Intake/Output Summary (Last 24 hours) at 05/25/2022 1654 Last data filed at 05/25/2022 1641 Gross per 24 hour  Intake 400 ml  Output --  Net 400 ml    Labs/Imaging Results for orders placed or performed during the hospital encounter of 05/25/22 (from the past 48 hour(s))  Lactic acid, plasma     Status: None   Collection Time: 05/25/22  1:15 PM  Result Value Ref Range   Lactic Acid, Venous 1.3 0.5 - 1.9 mmol/L    Comment: Performed at [facility], [address] [place], [place] [address]  Comprehensive metabolic panel     Status: Abnormal   Collection Time: 05/25/22  1:18 PM  Result Value Ref Range   Sodium 131 (L) 135 - 145 mmol/L   Potassium 2.4 (LL) 3.5 - 5.1 mmol/L    Comment: CRITICAL RESULT CALLED TO, READ BACK BY AND VERIFIED WITH J,BLUE RN @1419  05/25/22 E,BENTON   Chloride 95 (L) 98 - 111 mmol/L   CO2 21 (L) 22 - 32 mmol/L   Glucose, Bld 150 (H) 70 - 99 mg/dL    Comment: Glucose reference range applies only to samples taken after fasting for at least 8 hours.   BUN 10 8 - 23 mg/dL   Creatinine, Ser 6.04 0.44 - 1.00 mg/dL   Calcium 9.5 8.9 - 54.0 mg/dL   Total Protein 7.6 6.5 - 8.1 g/dL   Albumin 3.0 (L) 3.5 - 5.0 g/dL   AST 30 15 - 41 U/L   ALT 44 0 - 44 U/L   Alkaline Phosphatase 91 38 - 126 U/L   Total Bilirubin 0.9 0.3 - 1.2 mg/dL   GFR, Estimated >98 >11 mL/min    Comment: (NOTE) Calculated using the CKD-EPI Creatinine Equation (2021)    Anion gap 15 5 - 15    Comment: Performed at [facility], [address] [place], [place] [address]  CBC with Differential     Status: Abnormal   Collection Time: 05/25/22  1:18 PM  Result Value Ref Range   WBC 18.8 (H) 4.0 - 10.5 K/uL   RBC 5.25 (H) 3.87 - 5.11 MIL/uL   Hemoglobin 15.2 (H)  12.0 - 15.0 g/dL   HCT 29.5 62.1 - 30.8 %   MCV 84.6 80.0 - 100.0 fL   MCH 29.0 26.0 - 34.0 pg   MCHC 34.2 30.0 - 36.0 g/dL   RDW 65.7 84.6 - 96.2 %   Platelets 364 150 - 400 K/uL   nRBC 0.0 0.0 - 0.2 %   Neutrophils Relative % 83 %   Neutro Abs 15.7 (H) 1.7 - 7.7 K/uL   Lymphocytes Relative 5 %  Lymphs Abs 0.9 0.7 - 4.0 K/uL   Monocytes Relative 8 %   Monocytes Absolute 1.5 (H) 0.1 - 1.0 K/uL   Eosinophils Relative 0 %   Eosinophils Absolute 0.0 0.0 - 0.5 K/uL   Basophils Relative 1 %   Basophils Absolute 0.1 0.0 - 0.1 K/uL   Immature Granulocytes 3 %   Abs Immature Granulocytes 0.63 (H) 0.00 - 0.07 K/uL    Comment: Performed at [facility], [address] [place], [place] [address]  Protime-INR     Status: None   Collection Time: 05/25/22  1:18 PM  Result Value Ref Range   Prothrombin Time 15.0 11.4 - 15.2 seconds   INR 1.2 0.8 - 1.2    Comment: (NOTE) INR goal varies based on device and disease states. Performed at [facility], [address], [place], [place] [address]   Troponin I (High Sensitivity)     Status: Abnormal   Collection Time: 05/25/22  1:18 PM  Result Value Ref Range   Troponin I (High Sensitivity) 23 (H) <18 ng/L    Comment: (NOTE) Elevated high sensitivity troponin I (hsTnI) values and significant  changes across serial measurements may suggest ACS but many other  chronic and acute conditions are known to elevate hsTnI results.  Refer to the "Links" section for chest pain algorithms and additional  guidance. Performed at [facility], [address], [place], [place] [address]   Magnesium     Status: None   Collection Time: 05/25/22  2:23 PM  Result Value Ref Range   Magnesium 1.9 1.7 - 2.4 mg/dL    Comment: Performed at [facility], [address], [place], [place] [address]   DG Chest Portable 1 View  Result Date: 05/25/2022 CLINICAL DATA:  Cough, short of breath EXAM: PORTABLE CHEST 1 VIEW COMPARISON:  10/21/2015  FINDINGS: Single frontal view of the chest demonstrates an unremarkable cardiac silhouette. There is bibasilar airspace disease, right greater than left, consistent with pneumonia. No significant effusion or pneumothorax. No acute bony abnormalities. IMPRESSION: 1. Bibasilar airspace disease, right greater than left, consistent with pneumonia. Followup PA and lateral chest X-ray is recommended in 3-4 weeks following trial of antibiotic therapy to ensure resolution and exclude underlying malignancy. Electronically Signed   By: Sharlet Salina M.D.   On: 05/25/2022 14:10    Pending Labs Unresulted Labs (From admission, onward)     Start     Ordered   06/01/22 0500  Creatinine, serum  (enoxaparin (LOVENOX)    CrCl >/= 30 ml/min)  Weekly,   R     Comments: while on enoxaparin therapy    05/25/22 1444   05/26/22 0500  Basic metabolic panel  Tomorrow morning,   R        05/25/22 1444   05/26/22 0500  CBC  Tomorrow morning,   R        05/25/22 1444   05/25/22 1319  SARS Coronavirus 2 by RT PCR (hospital order, performed in [facility] lab) *cepheid single result test* Anterior Nasal Swab  (SARS Coronavirus 2 by RT PCR (hospital order, performed in [facility] hospital lab) *cepheid single result test*)  Once,   URGENT        05/25/22 1318   05/25/22 1309  Lactic acid, plasma  Now then every 2 hours,   R (with STAT occurrences)      05/25/22 1309   05/25/22 1309  Culture, blood (Routine x  2)  BLOOD CULTURE X 2,   R (with STAT occurrences)      05/25/22 1309   05/25/22 1309  Urinalysis, Routine w reflex microscopic -Urine, Clean Catch  Once,   URGENT       Question:  Specimen Source  Answer:  Urine, Clean Catch   05/25/22 1309            Vitals/Pain Today's Vitals   05/25/22 1415 05/25/22 1420 05/25/22 1425 05/25/22 1642  BP:      Pulse: (!) 102 100 97   Resp: (!) 27 (!) 32 (!) 21   Temp:      TempSrc:      SpO2: 93% 92% 93%   Weight:      Height:      PainSc:    0-No pain     Isolation Precautions No active isolations  Medications Medications  potassium chloride 10 mEq in 100 mL IVPB (0 mEq Intravenous Stopped 05/25/22 1641)  atorvastatin (LIPITOR) tablet 20 mg (has no administration in time range)  triamterene-hydrochlorothiazide (MAXZIDE-25) 37.5-25 MG per tablet 1 tablet (has no administration in time range)  cefTRIAXone (ROCEPHIN) 2 g in sodium chloride 0.9 % 100 mL IVPB (has no administration in time range)  azithromycin (ZITHROMAX) 500 mg in sodium chloride 0.9 % 250 mL IVPB (has no administration in time range)  enoxaparin (LOVENOX) injection 40 mg (has no administration in time range)  sodium chloride flush (NS) 0.9 % injection 3 mL (has no administration in time range)  acetaminophen (TYLENOL) tablet 650 mg (has no administration in time range)    Or  acetaminophen (TYLENOL) suppository 650 mg (has no administration in time range)  polyethylene glycol (MIRALAX / GLYCOLAX) packet 17 g (has no administration in time range)  guaiFENesin (MUCINEX) 12 hr tablet 600 mg (600 mg Oral Given 05/25/22 1528)  lactated ringers bolus 1,000 mL (0 mLs Intravenous Stopped 05/25/22 1523)  cefTRIAXone (ROCEPHIN) 2 g in sodium chloride 0.9 % 100 mL IVPB (0 g Intravenous Stopped 05/25/22 1522)  azithromycin (ZITHROMAX) 500 mg in sodium chloride 0.9 % 250 mL IVPB (0 mg Intravenous Stopped 05/25/22 1641)  magnesium sulfate IVPB 2 g 50 mL (0 g Intravenous Stopped 05/25/22 1641)  potassium chloride (KLOR-CON) packet 60 mEq (60 mEq Oral Given 05/25/22 1528)    Mobility walks with device     Focused Assessments Cardiac Assessment Handoff:    Lab Results  Component Value Date   TROPONINI <0.03 10/21/2015   No results found for: "DDIMER" Does the Patient currently have chest pain? No    R Recommendations: See Admitting Provider Note  Report given to:   Additional Notes: meds due

## 2022-05-25 NOTE — ED Provider Notes (Signed)
[facility]    CSN: [id number] Arrival date & time: 05/25/22  1202      History   Chief Complaint Chief Complaint  Patient presents with   Cough   Fever   Fatigue    HPI Jacqueline Ortiz is a 77 y.o. female.   Patient presents to clinic in wheelchair, she is hypoxic, tachycardic and fatigued.  Reports compliance with cefdinir.  Husband reports she has been getting worse.  The history is provided by the patient and medical records.  Cough Associated symptoms: fever and shortness of breath   Fever Associated symptoms: cough     Past Medical History:  Diagnosis Date   Allergic rhinosinusitis 2008   usual daily sinus drainage and post nasal drip   Arthritis 2023   osteoarthritis   Breast cancer (HCC)    Diverticulosis    patient unsure of date of diagnosis   Dry eyes, bilateral 2000   Duodenal diverticulum    patient unsure of diagnosis date   Dyslipidemia 2008   Eczema 1993   Esophageal stricture 2008   Hemorrhoids 2003   internal and external- remains- treats as needed with topical gels   History of hiatal hernia    Hyperlipidemia 2008   Hypertension 2021   Nephrolithiasis 2017   Osteoporosis 2003   Panic disorder    patient reports anxiety most of her life   Paralyzed vocal cords 1993   30 years ago/ per pt unsure of cause" speaks in soft whispery tone is normal"   Scoliosis deformity of spine    very severe"can't lay flat with out cushion"- started as a young teenager   Tubular adenoma of colon 09/06/2004   Dr. Stan Head    Patient Active Problem List   Diagnosis Date Noted   Genetic testing 04/12/2021   Family history of melanoma 03/28/2021   Family history of colon cancer 03/28/2021   Malignant neoplasm of overlapping sites of right breast in female, estrogen receptor positive (HCC) 03/05/2021   S/P Nissen fundoplication (with gastrostomy tube placement) (HCC) 02/06/2016   GERD with stricture 06/26/2011   Personal history of  adenomatous colonic polyp 06/26/2011    Past Surgical History:  Procedure Laterality Date   BREAST BIOPSY Right 03/15/2021   BREAST BIOPSY Right 04/18/2021   BREAST LUMPECTOMY WITH RADIOACTIVE SEED LOCALIZATION Right 05/01/2021   Procedure: RIGHT BREAST LUMPECTOMY WITH RADIOACTIVE SEED LOCALIZATION X4;  Surgeon: Harriette Bouillon, MD;  Location: [facility] OR;  Service: General;  Laterality: Right;   CESAREAN SECTION     COLONOSCOPY  2022   Dr. Stan Head   DILATION AND CURETTAGE OF UTERUS     ESOPHAGOGASTRODUODENOSCOPY  02/02/2003   Dr. Stan Head   ESOPHAGOGASTRODUODENOSCOPY N/A 10/21/2015   Procedure: ESOPHAGOGASTRODUODENOSCOPY (EGD);  Surgeon: Rachael Fee, MD;  Location: [facility];  Service: Endoscopy;  Laterality: N/A;   HIATAL HERNIA REPAIR     KNEE SURGERY Right    right fx- retained hardware   NISSEN FUNDOPLICATION  2018   SENTINEL NODE BIOPSY Right 05/01/2021   Procedure: SENTINEL NODE BIOPSY;  Surgeon: Harriette Bouillon, MD;  Location: [facility] OR;  Service: General;  Laterality: Right;   TONSILLECTOMY  1953   age 89   TUBAL LIGATION     UPPER GASTROINTESTINAL ENDOSCOPY     vocal cord surgery     "did improve condition"    OB History     Gravida  2   Para  1   Term  Preterm  1   AB  1   Living  1      SAB  1   IAB      Ectopic      Multiple      Live Births  1            Home Medications    Prior to Admission medications   Medication Sig Start Date End Date Taking? Authorizing Provider  atorvastatin (LIPITOR) 20 MG tablet Take 20 mg by mouth in the morning. 09/05/15  Yes [provider]  Calcium-Magnesium-Vitamin D 500-50-100 MG-MG-UNIT CHEW Chew 2 capsules by mouth 2 (two) times daily.   Yes [provider]  cefdinir (OMNICEF) 300 MG capsule as directed Orally at breakfast and dinner for 10 days 05/19/22  Yes [provider]  estradiol (ESTRACE) 0.1 MG/GM vaginal cream Place 0.5 g vaginally 2 (two) times a week. Place  0.5g nightly for two weeks then twice a week after 01/06/22  Yes Zuleta, Joan Mayans, NP  loratadine (CLARITIN) 10 MG tablet Take 10 mg by mouth in the morning.   Yes [provider]  acetaminophen (TYLENOL) 325 MG tablet Take 325 mg by mouth 2 (two) times daily.    [provider]  Ketotifen Fumarate (REFRESH EYE ITCH RELIEF OP) Place 1 drop into both eyes 4 (four) times daily as needed (eye irritation.).    [provider]  Phenylephrine-Witch Hazel (PREPARATION H) 0.25-50 % GEL Apply 1 application. topically as needed (irritation/discomfort).    [provider]  triamterene-hydrochlorothiazide (MAXZIDE-25) 37.5-25 MG tablet Take 1 tablet by mouth in the morning. 07/17/20   [provider]    Family History Family History  Problem Relation Age of Onset   Heart disease Mother    Diabetes Mother    Melanoma Father    Heart disease Father    Arthritis Father    Heart disease Sister    Skin cancer Brother        patient unsure of what type of skin cancer brother had   Colon polyps Brother    Colon cancer Maternal Grandmother 55   Cancer Maternal Aunt 65       sinus cancer   Esophageal cancer Neg Hx    Rectal cancer Neg Hx    Stomach cancer Neg Hx    Breast cancer Neg Hx     Social History Social History   Tobacco Use   Smoking status: Never   Smokeless tobacco: Never  Vaping Use   Vaping Use: Never used  Substance Use Topics   Alcohol use: No   Drug use: No     Allergies   Patient has no known allergies.   Review of Systems Review of Systems  Constitutional:  Positive for fatigue and fever.  Respiratory:  Positive for cough and shortness of breath.      Physical Exam Triage Vital Signs ED Triage Vitals  Enc Vitals Group     BP 05/25/22 1222 116/78     Pulse Rate 05/25/22 1222 (!) 112     Resp 05/25/22 1224 20     Temp 05/25/22 1222 98.3 F (36.8 C)     Temp Source 05/25/22 1222 Oral     SpO2 05/25/22 1222 (!) 87 %      Weight --      Height --      Head Circumference --      Peak Flow --      Pain Score --  Pain Loc --      Pain Edu? --      Excl. in GC? --    No data found.  Updated Vital Signs BP 116/78 (BP Location: Left Arm)   Pulse (!) 112   Temp 98.3 F (36.8 C) (Oral)   Resp 20   SpO2 (!) 87%   Visual Acuity Right Eye Distance:   Left Eye Distance:   Bilateral Distance:    Right Eye Near:   Left Eye Near:    Bilateral Near:     Physical Exam Vitals and nursing note reviewed.  Constitutional:      Appearance: She is ill-appearing and toxic-appearing.  HENT:     Head: Normocephalic and atraumatic.     Right Ear: External ear normal.     Left Ear: External ear normal.  Cardiovascular:     Rate and Rhythm: Regular rhythm. Tachycardia present.     Heart sounds: Normal heart sounds. No murmur heard. Pulmonary:     Breath sounds: Examination of the right-upper field reveals decreased breath sounds. Examination of the right-middle field reveals decreased breath sounds. Examination of the right-lower field reveals decreased breath sounds. Decreased breath sounds present.     Comments: Severely diminished breath sounds on right side. Psychiatric:        Behavior: Behavior is cooperative.      UC Treatments / Results  Labs (all labs ordered are listed, but only abnormal results are displayed) Labs Reviewed - No data to display  EKG   Radiology No results found.  Procedures Procedures (including critical care time)  Medications Ordered in UC Medications - No data to display  Initial Impression / Assessment and Plan / UC Course  I have reviewed the triage vital signs and the nursing notes.  Pertinent labs & imaging results that were available during my care of the patient were reviewed by me and considered in my medical decision making (see chart for details).  Vitals and triage reviewed, patient is hemodynamically unstable.  She is hypoxic and tachycardic.   Fatigue and fever have been ongoing despite cefdinir treatment for upper respiratory infection.  On auscultation, lung sounds are severely diminished on her right side.  Advised transfer to the emergency department for further evaluation, labs and workup.  Suspect sepsis/pneumonia/bacterial infection causing respiratory compromise.  Patient husband verbalized understanding, declined ambulance transport.  Will head POV to [facility] Emergency Department.     Final Clinical Impressions(s) / UC Diagnoses   Final diagnoses:  Acute respiratory failure with hypoxia [facility])   Discharge Instructions   None    ED Prescriptions   None    PDMP not reviewed this encounter.   Somnang Mahan, [place] N, [place] 05/25/22 [phone]

## 2022-05-26 DIAGNOSIS — E860 Dehydration: Secondary | ICD-10-CM | POA: Diagnosis present

## 2022-05-26 DIAGNOSIS — J189 Pneumonia, unspecified organism: Secondary | ICD-10-CM | POA: Diagnosis not present

## 2022-05-26 DIAGNOSIS — E876 Hypokalemia: Secondary | ICD-10-CM | POA: Diagnosis present

## 2022-05-26 DIAGNOSIS — K222 Esophageal obstruction: Secondary | ICD-10-CM | POA: Diagnosis not present

## 2022-05-26 DIAGNOSIS — Z8249 Family history of ischemic heart disease and other diseases of the circulatory system: Secondary | ICD-10-CM | POA: Diagnosis not present

## 2022-05-26 DIAGNOSIS — E871 Hypo-osmolality and hyponatremia: Secondary | ICD-10-CM | POA: Diagnosis present

## 2022-05-26 DIAGNOSIS — K219 Gastro-esophageal reflux disease without esophagitis: Secondary | ICD-10-CM | POA: Diagnosis present

## 2022-05-26 DIAGNOSIS — Z931 Gastrostomy status: Secondary | ICD-10-CM | POA: Diagnosis not present

## 2022-05-26 DIAGNOSIS — Z802 Family history of malignant neoplasm of other respiratory and intrathoracic organs: Secondary | ICD-10-CM | POA: Diagnosis not present

## 2022-05-26 DIAGNOSIS — M810 Age-related osteoporosis without current pathological fracture: Secondary | ICD-10-CM | POA: Diagnosis present

## 2022-05-26 DIAGNOSIS — Z808 Family history of malignant neoplasm of other organs or systems: Secondary | ICD-10-CM | POA: Diagnosis not present

## 2022-05-26 DIAGNOSIS — I10 Essential (primary) hypertension: Secondary | ICD-10-CM | POA: Diagnosis present

## 2022-05-26 DIAGNOSIS — Z853 Personal history of malignant neoplasm of breast: Secondary | ICD-10-CM | POA: Diagnosis not present

## 2022-05-26 DIAGNOSIS — E785 Hyperlipidemia, unspecified: Secondary | ICD-10-CM | POA: Diagnosis present

## 2022-05-26 DIAGNOSIS — R0602 Shortness of breath: Secondary | ICD-10-CM | POA: Diagnosis present

## 2022-05-26 DIAGNOSIS — Z170 Estrogen receptor positive status [ER+]: Secondary | ICD-10-CM | POA: Diagnosis not present

## 2022-05-26 DIAGNOSIS — J159 Unspecified bacterial pneumonia: Secondary | ICD-10-CM | POA: Diagnosis present

## 2022-05-26 DIAGNOSIS — Z923 Personal history of irradiation: Secondary | ICD-10-CM | POA: Diagnosis not present

## 2022-05-26 DIAGNOSIS — Z1152 Encounter for screening for COVID-19: Secondary | ICD-10-CM | POA: Diagnosis not present

## 2022-05-26 DIAGNOSIS — J154 Pneumonia due to other streptococci: Secondary | ICD-10-CM | POA: Diagnosis not present

## 2022-05-26 DIAGNOSIS — Z800 Family history of malignant neoplasm of digestive organs: Secondary | ICD-10-CM | POA: Diagnosis not present

## 2022-05-26 DIAGNOSIS — Z833 Family history of diabetes mellitus: Secondary | ICD-10-CM | POA: Diagnosis not present

## 2022-05-26 LAB — CBC
HCT: 35.7 % — ABNORMAL LOW (ref 36.0–46.0)
Hemoglobin: 12.5 g/dL (ref 12.0–15.0)
MCH: 29.3 pg (ref 26.0–34.0)
MCHC: 35 g/dL (ref 30.0–36.0)
MCV: 83.6 fL (ref 80.0–100.0)
Platelets: 295 10*3/uL (ref 150–400)
RBC: 4.27 MIL/uL (ref 3.87–5.11)
RDW: 14.1 % (ref 11.5–15.5)
WBC: 13.6 10*3/uL — ABNORMAL HIGH (ref 4.0–10.5)
nRBC: 0 % (ref 0.0–0.2)

## 2022-05-26 LAB — STREP PNEUMONIAE URINARY ANTIGEN: Strep Pneumo Urinary Antigen: NEGATIVE

## 2022-05-26 LAB — BASIC METABOLIC PANEL
Anion gap: 9 (ref 5–15)
BUN: 7 mg/dL — ABNORMAL LOW (ref 8–23)
CO2: 22 mmol/L (ref 22–32)
Calcium: 8.3 mg/dL — ABNORMAL LOW (ref 8.9–10.3)
Chloride: 103 mmol/L (ref 98–111)
Creatinine, Ser: 0.8 mg/dL (ref 0.44–1.00)
GFR, Estimated: >60 mL/min (ref >60)
Glucose, Bld: 137 mg/dL — ABNORMAL HIGH (ref 70–99)
Potassium: 2.9 mmol/L — ABNORMAL LOW (ref 3.5–5.1)
Sodium: 134 mmol/L — ABNORMAL LOW (ref 135–145)

## 2022-05-26 LAB — URINALYSIS, ROUTINE W REFLEX MICROSCOPIC
Bilirubin Urine: NEGATIVE
Glucose, UA: NEGATIVE mg/dL
Hgb urine dipstick: NEGATIVE
Ketones, ur: NEGATIVE mg/dL
Leukocytes,Ua: NEGATIVE
Nitrite: NEGATIVE
Protein, ur: NEGATIVE mg/dL
Specific Gravity, Urine: 1.01 (ref 1.005–1.030)
pH: 7 (ref 5.0–8.0)

## 2022-05-26 LAB — EXPECTORATED SPUTUM ASSESSMENT W GRAM STAIN, RFLX TO RESP C: Special Requests: NORMAL

## 2022-05-26 LAB — CULTURE, RESPIRATORY W GRAM STAIN: Special Requests: NORMAL

## 2022-05-26 MED ORDER — POTASSIUM CHLORIDE CRYS ER 20 MEQ PO TBCR
20.0000 meq | EXTENDED_RELEASE_TABLET | Freq: Three times a day (TID) | ORAL | Status: DC
  Administered 2022-05-26 – 2022-05-27 (×3): 20 meq via ORAL
  Filled 2022-05-26 (×3): qty 1

## 2022-05-26 MED ORDER — POTASSIUM CHLORIDE 20 MEQ PO PACK
60.0000 meq | PACK | Freq: Two times a day (BID) | ORAL | Status: DC
  Administered 2022-05-26: 60 meq via ORAL
  Filled 2022-05-26: qty 3

## 2022-05-26 MED ORDER — ONDANSETRON 4 MG PO TBDP
4.0000 mg | ORAL_TABLET | Freq: Three times a day (TID) | ORAL | Status: DC | PRN
  Administered 2022-05-26 – 2022-05-27 (×2): 4 mg via ORAL
  Filled 2022-05-26 (×2): qty 1

## 2022-05-26 MED ORDER — ATORVASTATIN CALCIUM 10 MG PO TABS
20.0000 mg | ORAL_TABLET | Freq: Every day | ORAL | Status: DC
  Administered 2022-05-26: 20 mg via ORAL
  Filled 2022-05-26: qty 2

## 2022-05-26 NOTE — Evaluation (Signed)
Occupational Therapy Evaluation Patient Details Name: Jacqueline Ortiz MRN: [id number] DOB: 05/01/1945 Today's Date: 05/26/2022   History of Present Illness The pt is a 77 yo female presenting 4/28 with cough and weakness. Found to have CAP with hypoxia. PMH includes: HLD, HTN, anxiety, scoliosis, and breast cancer s/p lumpectomy.   Clinical Impression   Patient admitted for the diagnosis above.  PTA she lives at home with her spouse and was independent with ADL,iADL and mobility.  Currently she presents as weak with poor activity tolerance impacting balance and ADL independence.  Patient is needing supervision for in room mobility and ADL completion.  She should progress back to baseline as she feels better.  OT is indicated in the acute setting to address deficits, but no post acute OT is anticipated.         Recommendations for follow up therapy are one component of a multi-disciplinary discharge planning process, led by the attending physician.  Recommendations may be updated based on patient status, additional functional criteria and insurance authorization.   Assistance Recommended at Discharge PRN  Patient can return home with the following Assist for transportation;Assistance with cooking/housework    Functional Status Assessment  Patient has had a recent decline in their functional status and demonstrates the ability to make significant improvements in function in a reasonable and predictable amount of time.  Equipment Recommendations  None recommended by OT    Recommendations for Other Services       Precautions / Restrictions Precautions Precautions: None Restrictions Weight Bearing Restrictions: No      Mobility Bed Mobility Overal bed mobility: Modified Independent                  Transfers Overall transfer level: Modified independent Equipment used: None Transfers: Sit to/from Stand Sit to Stand: Supervision, Min guard           General transfer  comment: initial unsteadiness      Balance Overall balance assessment: Needs assistance Sitting-balance support: No upper extremity supported Sitting balance-Leahy Scale: Good     Standing balance support: No upper extremity supported Standing balance-Leahy Scale: Fair                             ADL either performed or assessed with clinical judgement   ADL Overall ADL's : Needs assistance/impaired     Grooming: Wash/dry hands;Wash/dry face;Supervision/safety;Standing           Upper Body Dressing : Set up;Sitting   Lower Body Dressing: Supervision/safety;Sit to/from stand   Toilet Transfer: Retail banker;Ambulation                   Vision Patient Visual Report: No change from baseline       Perception     Praxis      Pertinent Vitals/Pain Pain Assessment Pain Assessment: No/denies pain     Hand Dominance Right   Extremity/Trunk Assessment Upper Extremity Assessment Upper Extremity Assessment: Overall WFL for tasks assessed   Lower Extremity Assessment Lower Extremity Assessment: Defer to PT evaluation       Communication Communication Communication: No difficulties   Cognition Arousal/Alertness: Awake/alert Behavior During Therapy: WFL for tasks assessed/performed Overall Cognitive Status: Within Functional Limits for tasks assessed  General Comments   HR to 107 with mobility.  96% O2 sats on RA    Exercises     Shoulder Instructions      Home Living Family/patient expects to be discharged to:: Private residence Living Arrangements: Spouse/significant other Available Help at Discharge: Family;Available 24 hours/day Type of Home: House Home Access: Stairs to enter [employer] of Steps: 3-4 Entrance Stairs-Rails: Right;Left Home Layout: One level     Bathroom Shower/Tub: Chief Strategy Officer: Standard Bathroom  Accessibility: Yes   Home Equipment: None          Prior Functioning/Environment Prior Level of Function : Independent/Modified Independent;Driving               ADLs Comments: independent        OT Problem List: Decreased activity tolerance;Impaired balance (sitting and/or standing);Decreased strength      OT Treatment/Interventions: Self-care/ADL training;Therapeutic activities;Patient/family education;Balance training    OT Goals(Current goals can be found in the care plan section) Acute Rehab OT Goals Patient Stated Goal: Feel better and return home OT Goal Formulation: With patient Time For Goal Achievement: 06/09/22 ADL Goals Pt Will Perform Grooming: Independently;standing Pt Will Perform Lower Body Dressing: Independently;sit to/from stand Pt Will Transfer to Toilet: Independently;ambulating;regular height toilet  OT Frequency: Min 2X/week    Co-evaluation              AM-PAC OT "6 Clicks" Daily Activity     Outcome Measure Help from another person eating meals?: None Help from another person taking care of personal grooming?: A Little Help from another person toileting, which includes using toliet, bedpan, or urinal?: A Little Help from another person bathing (including washing, rinsing, drying)?: A Little Help from another person to put on and taking off regular upper body clothing?: None Help from another person to put on and taking off regular lower body clothing?: A Little 6 Click Score: 20   End of Session Equipment Utilized During Treatment: Gait belt Nurse Communication: Mobility status  Activity Tolerance: Patient tolerated treatment well Patient left: in bed;with call bell/phone within reach;with family/visitor present  OT Visit Diagnosis: Unsteadiness on feet (R26.81)                Time: 1610-9604 OT Time Calculation (min): 18 min Charges:  OT General Charges $OT Visit: 1 Visit OT Evaluation $OT Eval Moderate Complexity: 1  Mod  05/26/2022  RP, OTR/L  Acute Rehabilitation Services  Office:  [phone]   Suzanna Obey 05/26/2022, 4:22 PM

## 2022-05-26 NOTE — Plan of Care (Signed)
  Problem: Activity: Goal: Ability to tolerate increased activity will improve Outcome: Progressing   Problem: Respiratory: Goal: Ability to maintain adequate ventilation will improve Outcome: Progressing Goal: Ability to maintain a clear airway will improve Outcome: Progressing   Problem: Education: Goal: Knowledge of General Education information will improve Description: Including pain rating scale, medication(s)/side effects and non-pharmacologic comfort measures Outcome: Progressing   Problem: Pain Managment: Goal: General experience of comfort will improve Outcome: Progressing   Problem: Safety: Goal: Ability to remain free from injury will improve Outcome: Progressing   Problem: Skin Integrity: Goal: Risk for impaired skin integrity will decrease Outcome: Progressing   Problem: Activity: Goal: Ability to tolerate increased activity will improve Outcome: Progressing   Problem: Respiratory: Goal: Ability to maintain a clear airway will improve Outcome: Progressing

## 2022-05-26 NOTE — Progress Notes (Signed)
PROGRESS NOTE    RHODESIA STANGER  ZOX:[id number] DOB: 1945-12-19 DOA: 05/25/2022 PCP: Geoffry Paradise, MD    Brief Narrative:  77 year old with history of GERD, status post Nissen fundoplication, hypertension, hyperlipidemia, vocal cord paralysis and scoliosis presented with 1 week of cough and shortness of breath.  Seen at PCP office and treated with cefdinir and steroid taper, initially felt better than symptoms worse.  Seen in urgent care with saturation 87% and tachycardic so sent to ER. In the emergency room heart rate 110, respiratory rate 20.  90% on room air.  CBC with leukocytosis 18.8.  Lactic acid normal.  COVID-19 negative.  Chest x-ray with bilateral lower lobe infiltrate right more than left.  Admitted due to significant symptoms.   Assessment & Plan:   Bilateral bacterial pneumonia: Agree with admission given severity of symptoms. Antibiotics to treat bacterial pneumonia.  Continue Rocephin and azithromycin. Chest physiotherapy, incentive spirometry, deep breathing exercises, sputum induction, mucolytic's and bronchodilators. Sputum cultures, blood cultures, Legionella and streptococcal antigen. Supplemental oxygen to keep saturations more than 90%.  Essential hypertension: Blood pressures low normal.  Hold diuretics today.  Hypokalemia: Persistent.  Patient on triamterene hydrochlorothiazide at home.  Hold diuretics.  Replace aggressively.  Magnesium was replaced.  Will recheck levels tomorrow morning.  Chronic medical issues including Hyperlipidemia, on statin GERD, stable History of breast cancer, on surveillance.    DVT prophylaxis: enoxaparin (LOVENOX) injection 40 mg Start: 05/25/22 1445   Code Status: Full code Family Communication: Husband at the bedside Disposition Plan: Status is: Observation The patient will require care spanning > 2 midnights and should be moved to inpatient because: Significant pneumonia, needs IV antibiotics     Consultants:   None  Procedures:  None  Antimicrobials:  Rocephin and azithromycin 4/28---   Subjective: Patient seen and examined.  Has dry cough.  Short of breath on walking to the bathroom.  Afebrile. Complains of nausea after taking potassium pills.  Objective: Vitals:   05/25/22 1706 05/25/22 1729 05/26/22 0456 05/26/22 0758  BP:  139/80 106/69 (!) 93/59  Pulse: 93 93 84 88  Resp:  18 18   Temp:  98 F (36.7 C) 99.3 F (37.4 C) 98 F (36.7 C)  TempSrc:   Oral Oral  SpO2:   94% 93%  Weight:      Height:        Intake/Output Summary (Last 24 hours) at 05/26/2022 1251 Last data filed at 05/26/2022 1200 Gross per 24 hour  Intake 400 ml  Output 300 ml  Net 100 ml   Filed Weights   05/25/22 1302  Weight: 60.2 kg    Examination:  General exam: Appears calm and comfortable, slightly anxious. Respiratory system: Mostly conducted upper airway sounds. Cardiovascular system: S1 & S2 heard, RRR. No JVD, murmurs, rubs, gallops or clicks. No pedal edema. Gastrointestinal system: Abdomen is nondistended, soft and nontender. No organomegaly or masses felt. Normal bowel sounds heard. Central nervous system: Alert and oriented. No focal neurological deficits. Extremities: Symmetric 5 x 5 power. Skin: No rashes, lesions or ulcers Psychiatry: Judgement and insight appear normal. Mood & affect appropriate.     Data Reviewed: I have personally reviewed following labs and imaging studies  CBC: Recent Labs  Lab 05/25/22 1318 05/26/22 0123  WBC 18.8* 13.6*  NEUTROABS 15.7*  --   HGB 15.2* 12.5  HCT 44.4 35.7*  MCV 84.6 83.6  PLT 364 295   Basic Metabolic Panel: Recent Labs  Lab 05/25/22 1318 05/25/22 1423  05/26/22 0123  NA 131*  --  134*  K 2.4*  --  2.9*  CL 95*  --  103  CO2 21*  --  22  GLUCOSE 150*  --  137*  BUN 10  --  7*  CREATININE 0.92  --  0.80  CALCIUM 9.5  --  8.3*  MG  --  1.9  --    GFR: Estimated Creatinine Clearance: 52.1 mL/min (by C-G formula based  on SCr of 0.8 mg/dL). Liver Function Tests: Recent Labs  Lab 05/25/22 1318  AST 30  ALT 44  ALKPHOS 91  BILITOT 0.9  PROT 7.6  ALBUMIN 3.0*   No results for input(s): "LIPASE", "AMYLASE" in the last 168 hours. No results for input(s): "AMMONIA" in the last 168 hours. Coagulation Profile: Recent Labs  Lab 05/25/22 1318  INR 1.2   Cardiac Enzymes: No results for input(s): "CKTOTAL", "CKMB", "CKMBINDEX", "TROPONINI" in the last 168 hours. BNP (last 3 results) No results for input(s): "PROBNP" in the last 8760 hours. HbA1C: No results for input(s): "HGBA1C" in the last 72 hours. CBG: No results for input(s): "GLUCAP" in the last 168 hours. Lipid Profile: No results for input(s): "CHOL", "HDL", "LDLCALC", "TRIG", "CHOLHDL", "LDLDIRECT" in the last 72 hours. Thyroid Function Tests: No results for input(s): "TSH", "T4TOTAL", "FREET4", "T3FREE", "THYROIDAB" in the last 72 hours. Anemia Panel: No results for input(s): "VITAMINB12", "FOLATE", "FERRITIN", "TIBC", "IRON", "RETICCTPCT" in the last 72 hours. Sepsis Labs: Recent Labs  Lab 05/25/22 1315 05/25/22 1730  LATICACIDVEN 1.3 1.1    Recent Results (from the past 240 hour(s))  Culture, blood (Routine x 2)     Status: None (Preliminary result)   Collection Time: 05/25/22  1:18 PM   Specimen: BLOOD  Result Value Ref Range Status   Specimen Description BLOOD RIGHT ANTECUBITAL  Final   Special Requests   Final    BOTTLES DRAWN AEROBIC AND ANAEROBIC Blood Culture adequate volume   Culture   Final    NO GROWTH < 24 HOURS Performed at [facility], [address] [place], [place] [address]    Report Status PENDING  Incomplete  Culture, blood (Routine x 2)     Status: None (Preliminary result)   Collection Time: 05/25/22  2:20 PM   Specimen: BLOOD RIGHT FOREARM  Result Value Ref Range Status   Specimen Description BLOOD RIGHT FOREARM  Final   Special Requests   Final    BOTTLES DRAWN AEROBIC AND ANAEROBIC Blood  Culture results may not be optimal due to an excessive volume of blood received in culture bottles   Culture   Final    NO GROWTH < 24 HOURS Performed at [facility], [address], [place], [place] [address]    Report Status PENDING  Incomplete  SARS Coronavirus 2 by RT PCR (hospital order, performed in [facility] hospital lab) *cepheid single result test* Anterior Nasal Swab     Status: None   Collection Time: 05/25/22  4:00 PM   Specimen: Anterior Nasal Swab  Result Value Ref Range Status   SARS Coronavirus 2 by RT PCR NEGATIVE NEGATIVE Final    Comment: Performed at [facility], [address], [place], [place] [address]  Expectorated Sputum Assessment w Gram Stain, Rflx to Resp Cult     Status: None   Collection Time: 05/26/22 10:17 AM   Specimen: Sputum  Result Value Ref Range Status   Specimen Description SPU  Final  Special Requests  EXPECTORATED  Final   Sputum evaluation   Final    Sputum specimen not acceptable for testing.  Please recollect.    Roselyn Meier, RN 05/26/22 1242 A. LAFRANCE Performed at [facility], [address] [place], [place] [address]    Report Status 05/26/2022 FINAL  Final         Radiology Studies: DG Chest Portable 1 View  Result Date: 05/25/2022 CLINICAL DATA:  Cough, short of breath EXAM: PORTABLE CHEST 1 VIEW COMPARISON:  10/21/2015 FINDINGS: Single frontal view of the chest demonstrates an unremarkable cardiac silhouette. There is bibasilar airspace disease, right greater than left, consistent with pneumonia. No significant effusion or pneumothorax. No acute bony abnormalities. IMPRESSION: 1. Bibasilar airspace disease, right greater than left, consistent with pneumonia. Followup PA and lateral chest X-ray is recommended in 3-4 weeks following trial of antibiotic therapy to ensure resolution and exclude underlying malignancy. Electronically Signed   By: Sharlet Salina M.D.   On: 05/25/2022 14:10        Scheduled  Meds:  atorvastatin  20 mg Oral QHS   enoxaparin (LOVENOX) injection  40 mg Subcutaneous Q24H   guaiFENesin  600 mg Oral BID   potassium chloride  20 mEq Oral TID   sodium chloride flush  3 mL Intravenous Q12H   Continuous Infusions:  azithromycin 500 mg (05/26/22 0830)   cefTRIAXone (ROCEPHIN)  IV 2 g (05/26/22 0828)     LOS: 0 days    Time spent: 35 minutes    Dorcas Carrow, MD [facility] Pager [phone]

## 2022-05-26 NOTE — Plan of Care (Signed)
  Problem: Activity: Goal: Ability to tolerate increased activity will improve Outcome: Progressing   Problem: Respiratory: Goal: Ability to maintain adequate ventilation will improve Outcome: Progressing   Problem: Activity: Goal: Ability to tolerate increased activity will improve Outcome: Progressing   

## 2022-05-26 NOTE — Evaluation (Signed)
Physical Therapy Evaluation Patient Details Name: Jacqueline Ortiz MRN: [id number] DOB: 1945/09/25 Today's Date: 05/26/2022  History of Present Illness  The pt is a 77 yo female presenting 4/28 with cough and weakness. Found to have CAP with hypoxia. PMH includes: HLD, HTN, anxiety, scoliosis, and breast cancer s/p lumpectomy.   Clinical Impression  Pt in bed upon arrival of PT, agreeable to evaluation at this time. Prior to admission the pt was independent with all mobility without falls or need for DME. The pt now presents with minor limitations in endurance, power, and dynamic stability due to dx, and will continue to benefit from skilled PT to address these deficits. The pt was able to complete sit-stand transfers without need for UE support and short-distance hallway ambulation with minG and RA this session. She is moving with slower gait speed and less stability than baseline, but did not need physical assist at this time. Suspect she will continue to progress with endurance and stability, and be able to return home with support from her spouse when medically stable for d/c.         Recommendations for follow up therapy are one component of a multi-disciplinary discharge planning process, led by the attending physician.  Recommendations may be updated based on patient status, additional functional criteria and insurance authorization.  Follow Up Recommendations       Assistance Recommended at Discharge Intermittent Supervision/Assistance  Patient can return home with the following  Assistance with cooking/housework;Assist for transportation;Help with stairs or ramp for entrance    Equipment Recommendations None recommended by PT  Recommendations for Other Services       Functional Status Assessment Patient has had a recent decline in their functional status and demonstrates the ability to make significant improvements in function in a reasonable and predictable amount of time.      Precautions / Restrictions Precautions Precautions: None Restrictions Weight Bearing Restrictions: No      Mobility  Bed Mobility Overal bed mobility: Needs Assistance Bed Mobility: Supine to Sit     Supine to sit: Supervision     General bed mobility comments: increased time, no physical assist given    Transfers Overall transfer level: Needs assistance Equipment used: None Transfers: Sit to/from Stand Sit to Stand: Min guard           General transfer comment: no UE support, slightly unsteady but no assist needed    Ambulation/Gait Ambulation/Gait assistance: Min guard Gait Distance (Feet): 75 Feet Assistive device: None Gait Pattern/deviations: Step-through pattern, Decreased stride length Gait velocity: decreased Gait velocity interpretation: <1.31 ft/sec, indicative of household ambulator   General Gait Details: slowed but steady gait, no UE support. SpO2 93 or above on RA     Balance Overall balance assessment: Needs assistance Sitting-balance support: No upper extremity supported Sitting balance-Leahy Scale: Good     Standing balance support: No upper extremity supported, During functional activity Standing balance-Leahy Scale: Fair                               Pertinent Vitals/Pain Pain Assessment Pain Assessment: No/denies pain    Home Living Family/patient expects to be discharged to:: Private residence Living Arrangements: Spouse/significant other Available Help at Discharge: Family;Available 24 hours/day Type of Home: House Home Access: Stairs to enter Entrance Stairs-Rails: Doctor, general practice of Steps: 3-4   Home Layout: One level Home Equipment: None      Prior Function Prior  Level of Function : Independent/Modified Independent;Driving             Mobility Comments: no DME, no falls, uses exercise bike 20 min 5days/week ADLs Comments: independent     Hand Dominance   Dominant Hand:  Right    Extremity/Trunk Assessment   Upper Extremity Assessment Upper Extremity Assessment: Overall WFL for tasks assessed    Lower Extremity Assessment Lower Extremity Assessment: Overall WFL for tasks assessed    Cervical / Trunk Assessment Cervical / Trunk Assessment: Other exceptions Cervical / Trunk Exceptions: scoliosis  Communication   Communication: No difficulties  Cognition Arousal/Alertness: Awake/alert Behavior During Therapy: WFL for tasks assessed/performed Overall Cognitive Status: Within Functional Limits for tasks assessed                                 General Comments: slightly flat and soft spoken, but able to follow all instructions and judge safet appropriately        General Comments General comments (skin integrity, edema, etc.): VSS on RA with all mobility, HR to 113bpm, Spo2 to 93%    Exercises     Assessment/Plan    PT Assessment Patient needs continued PT services  PT Problem List Cardiopulmonary status limiting activity;Decreased activity tolerance       PT Treatment Interventions Gait training;DME instruction;Stair training;Functional mobility training;Therapeutic activities;Therapeutic exercise;Balance training;Patient/family education    PT Goals (Current goals can be found in the Care Plan section)  Acute Rehab PT Goals Patient Stated Goal: return home PT Goal Formulation: With patient Time For Goal Achievement: 06/09/22 Potential to Achieve Goals: Good    Frequency Min 3X/week        AM-PAC PT "6 Clicks" Mobility  Outcome Measure Help needed turning from your back to your side while in a flat bed without using bedrails?: None Help needed moving from lying on your back to sitting on the side of a flat bed without using bedrails?: A Little Help needed moving to and from a bed to a chair (including a wheelchair)?: A Little Help needed standing up from a chair using your arms (e.g., wheelchair or bedside chair)?:  A Little Help needed to walk in hospital room?: A Little Help needed climbing 3-5 steps with a railing? : A Little 6 Click Score: 19    End of Session Equipment Utilized During Treatment: Gait belt Activity Tolerance: Patient tolerated treatment well Patient left: in chair;with call bell/phone within reach;with chair alarm set Nurse Communication: Mobility status PT Visit Diagnosis: Other abnormalities of gait and mobility (R26.89)    Time: 4098-1191 PT Time Calculation (min) (ACUTE ONLY): 29 min   Charges:   PT Evaluation $PT Eval Low Complexity: 1 Low PT Treatments $Gait Training: 8-22 mins        Vickki Muff, PT, DPT   Acute Rehabilitation Department Office [phone] Secure Chat Communication Preferred  Ronnie Derby 05/26/2022, 10:24 AM

## 2022-05-26 NOTE — Plan of Care (Signed)
  Problem: Activity: Goal: Ability to tolerate increased activity will improve Outcome: Progressing   Problem: Respiratory: Goal: Ability to maintain a clear airway will improve Outcome: Progressing   Problem: Activity: Goal: Risk for activity intolerance will decrease Outcome: Progressing

## 2022-05-27 DIAGNOSIS — J154 Pneumonia due to other streptococci: Secondary | ICD-10-CM

## 2022-05-27 DIAGNOSIS — K222 Esophageal obstruction: Secondary | ICD-10-CM | POA: Diagnosis not present

## 2022-05-27 DIAGNOSIS — K219 Gastro-esophageal reflux disease without esophagitis: Secondary | ICD-10-CM | POA: Diagnosis not present

## 2022-05-27 DIAGNOSIS — Z931 Gastrostomy status: Secondary | ICD-10-CM | POA: Diagnosis not present

## 2022-05-27 LAB — CBC WITH DIFFERENTIAL/PLATELET
Abs Immature Granulocytes: 0.4 10*3/uL — ABNORMAL HIGH (ref 0.00–0.07)
Basophils Absolute: 0 10*3/uL (ref 0.0–0.1)
Basophils Relative: 0 %
Eosinophils Absolute: 0.4 10*3/uL (ref 0.0–0.5)
Eosinophils Relative: 4 %
HCT: 37.3 % (ref 36.0–46.0)
Hemoglobin: 12.3 g/dL (ref 12.0–15.0)
Lymphocytes Relative: 16 %
Lymphs Abs: 1.6 10*3/uL (ref 0.7–4.0)
MCH: 28.7 pg (ref 26.0–34.0)
MCHC: 33 g/dL (ref 30.0–36.0)
MCV: 86.9 fL (ref 80.0–100.0)
Metamyelocytes Relative: 1 %
Monocytes Absolute: 0.7 10*3/uL (ref 0.1–1.0)
Monocytes Relative: 7 %
Myelocytes: 3 %
Neutro Abs: 6.8 10*3/uL (ref 1.7–7.7)
Neutrophils Relative %: 69 %
Platelets: 280 10*3/uL (ref 150–400)
RBC: 4.29 MIL/uL (ref 3.87–5.11)
RDW: 14.6 % (ref 11.5–15.5)
WBC: 9.9 10*3/uL (ref 4.0–10.5)
nRBC: 0 % (ref 0.0–0.2)
nRBC: 0 /100 WBC

## 2022-05-27 LAB — BASIC METABOLIC PANEL
Anion gap: 5 (ref 5–15)
BUN: 6 mg/dL — ABNORMAL LOW (ref 8–23)
CO2: 24 mmol/L (ref 22–32)
Calcium: 8.6 mg/dL — ABNORMAL LOW (ref 8.9–10.3)
Chloride: 104 mmol/L (ref 98–111)
Creatinine, Ser: 0.78 mg/dL (ref 0.44–1.00)
GFR, Estimated: >60 mL/min (ref >60)
Glucose, Bld: 99 mg/dL (ref 70–99)
Potassium: 3.7 mmol/L (ref 3.5–5.1)
Sodium: 133 mmol/L — ABNORMAL LOW (ref 135–145)

## 2022-05-27 LAB — LEGIONELLA PNEUMOPHILA SEROGP 1 UR AG: L. pneumophila Serogp 1 Ur Ag: NEGATIVE

## 2022-05-27 LAB — CULTURE, RESPIRATORY W GRAM STAIN

## 2022-05-27 LAB — PHOSPHORUS: Phosphorus: 2.8 mg/dL (ref 2.5–4.6)

## 2022-05-27 LAB — MAGNESIUM: Magnesium: 2 mg/dL (ref 1.7–2.4)

## 2022-05-27 MED ORDER — AMOXICILLIN-POT CLAVULANATE 875-125 MG PO TABS: 1.0000 | ORAL_TABLET | Freq: Two times a day (BID) | ORAL | 0 refills | Status: AC

## 2022-05-27 MED ORDER — ALBUTEROL SULFATE HFA 108 (90 BASE) MCG/ACT IN AERS: 2.0000 | INHALATION_SPRAY | Freq: Four times a day (QID) | RESPIRATORY_TRACT | 0 refills | Status: DC | PRN

## 2022-05-27 MED ORDER — POTASSIUM CHLORIDE CRYS ER 10 MEQ PO TBCR: 20.0000 meq | EXTENDED_RELEASE_TABLET | Freq: Every day | ORAL | 0 refills | Status: DC

## 2022-05-27 MED ORDER — GUAIFENESIN ER 600 MG PO TB12: 600.0000 mg | ORAL_TABLET | Freq: Two times a day (BID) | ORAL | 0 refills | Status: AC

## 2022-05-27 NOTE — Discharge Summary (Signed)
Physician Discharge Summary  LUCILLIE KIESEL NWG:[id number] DOB: 08-07-1945 DOA: 05/25/2022  PCP: Geoffry Paradise, MD  Admit date: 05/25/2022 Discharge date: 05/27/2022  Admitted From: Home Disposition: Home  Recommendations for Outpatient Follow-up:  Follow up with PCP in 1-2 weeks Please obtain BMP/CBC in one week Follow-up with oncology for cancer surveillance.  Home Health: N/A Equipment/Devices: N/A  Discharge Condition: Stable CODE STATUS: Full code Diet recommendation: Low-salt diet  Discharge summary: 77 year old with history of GERD, status post Nissen fundoplication, hypertension, hyperlipidemia, vocal cord paralysis and scoliosis presented with 1 week of cough and shortness of breath.  Seen at PCP office and treated with cefdinir and steroid taper, initially felt better than symptoms worse.  Seen in urgent care with saturation 87% and tachycardic so sent to ER. In the emergency room heart rate 110, respiratory rate 20.  90% on room air.  CBC with leukocytosis 18.8.  Lactic acid normal.  COVID-19 negative.  Chest x-ray with bilateral lower lobe infiltrate right more than left.  Admitted due to significant symptoms.     Bilateral bacterial pneumonia: Adequately treated in the hospital.  Received 2 days of IV antibiotics. Blood cultures negative so far.  Sputum cultures negative so far. Legionella and streptococcal antigen pending. Currently on room air and symptomatically improved. With patient's clinical improvement, will discharge patient with Augmentin for additional 5 days to complete 7 days of therapy. Scheduled Mucinex, as needed over-the-counter cough medications.  She may also benefit with albuterol inhaler and will prescribe this. Patient will take incentive spirometry and flutter valve at home and continue to work on it.   Essential hypertension: Blood pressures stable and low normal.  At home also reported blood pressures were adequate.  Currently she does not need  any antihypertensives.  Will discontinue triamterene hydrochlorothiazide.  She will keep a logbook of blood pressures at home and bring it to doctors visit for follow-up.   Hypokalemia: Persistent.  Patient on triamterene hydrochlorothiazide at home.  Hold diuretics.  Replace aggressively.  Patient will be prescribed additional potassium supplementation on discharge.   Chronic medical issues including Hyperlipidemia, on statin GERD, stable History of breast cancer, on surveillance.  Stable for discharge.   Discharge Diagnoses:  Principal Problem:   CAP (community acquired pneumonia) Active Problems:   GERD with stricture   S/P Nissen fundoplication (with gastrostomy tube placement) (HCC)   Malignant neoplasm of overlapping sites of right breast in female, estrogen receptor positive (HCC)   HTN (hypertension)   HLD (hyperlipidemia)    Discharge Instructions  Discharge Instructions     Diet - low sodium heart healthy   Complete by: As directed    Increase activity slowly   Complete by: As directed       Allergies as of 05/27/2022   No Known Allergies      Medication List     STOP taking these medications    cefdinir 300 MG capsule Commonly known as: OMNICEF   predniSONE 10 MG tablet Commonly known as: DELTASONE   triamterene-hydrochlorothiazide 37.5-25 MG tablet Commonly known as: MAXZIDE-25       TAKE these medications    acetaminophen 325 MG tablet Commonly known as: TYLENOL Take 325 mg by mouth every 6 (six) hours as needed for moderate pain.   albuterol 108 (90 Base) MCG/ACT inhaler Commonly known as: VENTOLIN HFA Inhale 2 puffs into the lungs every 6 (six) hours as needed for wheezing or shortness of breath.   amoxicillin-clavulanate 875-125 MG tablet Commonly known as:  AUGMENTIN Take 1 tablet by mouth 2 (two) times daily for 5 days.   atorvastatin 20 MG tablet Commonly known as: LIPITOR Take 20 mg by mouth in the morning.    Calcium-Magnesium-Vitamin D 500-50-100 MG-MG-UNIT Chew Chew 2 capsules by mouth 2 (two) times daily.   estradiol 0.1 MG/GM vaginal cream Commonly known as: ESTRACE Place 0.5 g vaginally 2 (two) times a week. Place 0.5g nightly for two weeks then twice a week after   guaiFENesin 600 MG 12 hr tablet Commonly known as: MUCINEX Take 1 tablet (600 mg total) by mouth 2 (two) times daily for 14 days.   loratadine 10 MG tablet Commonly known as: CLARITIN Take 10 mg by mouth in the morning.   potassium chloride 10 MEQ tablet Commonly known as: KLOR-CON M Take 2 tablets (20 mEq total) by mouth daily for 7 days.   Preparation H 0.25-50 % Gel Generic drug: Phenylephrine-Witch [place]el Apply 1 application  topically daily as needed (irritation/discomfort).   promethazine-dextromethorphan 6.25-15 MG/5ML syrup Commonly known as: PROMETHAZINE-DM Take 5 mLs by mouth daily as needed for cough.   REFRESH EYE ITCH RELIEF OP Place 1 drop into both eyes 4 (four) times daily as needed (eye irritation.).        No Known Allergies  Consultations: None   Procedures/Studies: DG Chest Portable 1 View  Result Date: 05/25/2022 CLINICAL DATA:  Cough, short of breath EXAM: PORTABLE CHEST 1 VIEW COMPARISON:  10/21/2015 FINDINGS: Single frontal view of the chest demonstrates an unremarkable cardiac silhouette. There is bibasilar airspace disease, right greater than left, consistent with pneumonia. No significant effusion or pneumothorax. No acute bony abnormalities. IMPRESSION: 1. Bibasilar airspace disease, right greater than left, consistent with pneumonia. Followup PA and lateral chest X-ray is recommended in 3-4 weeks following trial of antibiotic therapy to ensure resolution and exclude underlying malignancy. Electronically Signed   By: Sharlet Salina M.D.   On: 05/25/2022 14:10   (Echo, Carotid, EGD, Colonoscopy, ERCP)    Subjective: Patient seen and examined.  Has episodes of cough but mostly dry.   Getting up and walking around.  On room air.  Feels much better.  Afebrile.  Wants to go home.   Discharge Exam: Vitals:   05/26/22 2226 05/27/22 0759  BP: 110/74 107/70  Pulse: 79 75  Resp: 18   Temp: 97.9 F (36.6 C) (!) 97.5 F (36.4 C)  SpO2: 96% 97%   Vitals:   05/26/22 0758 05/26/22 1441 05/26/22 2226 05/27/22 0759  BP: (!) 93/59 107/61 110/74 107/70  Pulse: 88 94 79 75  Resp:   18   Temp: 98 F (36.7 C) 99.1 F (37.3 C) 97.9 F (36.6 C) (!) 97.5 F (36.4 C)  TempSrc: Oral Oral Oral Oral  SpO2: 93% 93% 96% 97%  Weight:      Height:        General: Pt is alert, awake, not in acute distress Cardiovascular: RRR, S1/S2 +, no rubs, no gallops Respiratory: CTA bilaterally, no wheezing, no rhonchi, induces cough on deep breathing. Abdominal: Soft, NT, ND, bowel sounds + Extremities: no edema, no cyanosis    The results of significant diagnostics from this hospitalization (including imaging, microbiology, ancillary and laboratory) are listed below for reference.     Microbiology: Recent Results (from the past 240 hour(s))  Culture, blood (Routine x 2)     Status: None (Preliminary result)   Collection Time: 05/25/22  1:18 PM   Specimen: BLOOD  Result Value Ref Range Status  Specimen Description BLOOD RIGHT ANTECUBITAL  Final   Special Requests   Final    BOTTLES DRAWN AEROBIC AND ANAEROBIC Blood Culture adequate volume   Culture   Final    NO GROWTH 2 DAYS Performed at [facility], [address], [place], [place] [address]    Report Status PENDING  Incomplete  Culture, blood (Routine x 2)     Status: None (Preliminary result)   Collection Time: 05/25/22  2:20 PM   Specimen: BLOOD RIGHT FOREARM  Result Value Ref Range Status   Specimen Description BLOOD RIGHT FOREARM  Final   Special Requests   Final    BOTTLES DRAWN AEROBIC AND ANAEROBIC Blood Culture results may not be optimal due to an excessive volume of blood received in culture bottles    Culture   Final    NO GROWTH 2 DAYS Performed at [facility], [address], [place], [place] [address]    Report Status PENDING  Incomplete  SARS Coronavirus 2 by RT PCR (hospital order, performed in [facility] hospital lab) *cepheid single result test* Anterior Nasal Swab     Status: None   Collection Time: 05/25/22  4:00 PM   Specimen: Anterior Nasal Swab  Result Value Ref Range Status   SARS Coronavirus 2 by RT PCR NEGATIVE NEGATIVE Final    Comment: Performed at [facility], [address], [place], [place] [address]  Expectorated Sputum Assessment w Gram Stain, Rflx to Resp Cult     Status: None   Collection Time: 05/26/22 10:17 AM   Specimen: Sputum  Result Value Ref Range Status   Specimen Description SPU  Final   Special Requests  EXPECTORATED  Final   Sputum evaluation   Final    Sputum specimen not acceptable for testing.  Please recollect.    Roselyn Meier, RN 05/26/22 1242 A. LAFRANCE Performed at [facility], [address] [place], [place] [address]    Report Status 05/26/2022 FINAL  Final  Expectorated Sputum Assessment w Gram Stain, Rflx to Resp Cult     Status: None   Collection Time: 05/26/22 12:45 PM   Specimen: Expectorated Sputum  Result Value Ref Range Status   Specimen Description EXPECTORATED SPUTUM  Final   Special Requests Normal  Final   Sputum evaluation   Final    THIS SPECIMEN IS ACCEPTABLE FOR SPUTUM CULTURE Performed at [facility], [address] [place], [place] [address]    Report Status 05/26/2022 FINAL  Final  Culture, Respiratory w Gram Stain     Status: None (Preliminary result)   Collection Time: 05/26/22 12:45 PM  Result Value Ref Range Status   Specimen Description EXPECTORATED SPUTUM  Final   Special Requests Normal Reflexed from [id number]  Final   Gram Stain   Final    ABUNDANT WBC PRESENT, PREDOMINANTLY PMN NO ORGANISMS SEEN Performed at [facility], [address] [place], [place] [address]     Culture PENDING  Incomplete   Report Status PENDING  Incomplete     Labs: BNP (last 3 results) No results for input(s): "BNP" in the last 8760 hours. Basic Metabolic Panel: Recent Labs  Lab 05/25/22 1318 05/25/22 1423 05/26/22 0123 05/27/22 0420  NA 131*  --  134* 133*  K 2.4*  --  2.9* 3.7  CL 95*  --  103 104  CO2 21*  --  22 24  GLUCOSE 150*  --  137* 99  BUN 10  --  7* 6*  CREATININE 0.92  --  0.80 0.78  CALCIUM 9.5  --  8.3* 8.6*  MG  --  1.9  --  2.0  PHOS  --   --   --  2.8   Liver Function Tests: Recent Labs  Lab 05/25/22 1318  AST 30  ALT 44  ALKPHOS 91  BILITOT 0.9  PROT 7.6  ALBUMIN 3.0*   No results for input(s): "LIPASE", "AMYLASE" in the last 168 hours. No results for input(s): "AMMONIA" in the last 168 hours. CBC: Recent Labs  Lab 05/25/22 1318 05/26/22 0123 05/27/22 0420  WBC 18.8* 13.6* 9.9  NEUTROABS 15.7*  --  6.8  HGB 15.2* 12.5 12.3  HCT 44.4 35.7* 37.3  MCV 84.6 83.6 86.9  PLT 364 295 280   Cardiac Enzymes: No results for input(s): "CKTOTAL", "CKMB", "CKMBINDEX", "TROPONINI" in the last 168 hours. BNP: Invalid input(s): "POCBNP" CBG: No results for input(s): "GLUCAP" in the last 168 hours. D-Dimer No results for input(s): "DDIMER" in the last 72 hours. Hgb A1c No results for input(s): "HGBA1C" in the last 72 hours. Lipid Profile No results for input(s): "CHOL", "HDL", "LDLCALC", "TRIG", "CHOLHDL", "LDLDIRECT" in the last 72 hours. Thyroid function studies No results for input(s): "TSH", "T4TOTAL", "T3FREE", "THYROIDAB" in the last 72 hours.  Invalid input(s): "FREET3" Anemia work up No results for input(s): "VITAMINB12", "FOLATE", "FERRITIN", "TIBC", "IRON", "RETICCTPCT" in the last 72 hours. Urinalysis    Component Value Date/Time   COLORURINE STRAW (A) 05/26/2022 1203   APPEARANCEUR CLEAR 05/26/2022 1203   LABSPEC 1.010 05/26/2022 1203   PHURINE 7.0 05/26/2022 1203   GLUCOSEU NEGATIVE 05/26/2022 1203   HGBUR  NEGATIVE 05/26/2022 1203   BILIRUBINUR NEGATIVE 05/26/2022 1203   BILIRUBINUR Negative 12/05/2021 1501   KETONESUR NEGATIVE 05/26/2022 1203   PROTEINUR NEGATIVE 05/26/2022 1203   UROBILINOGEN 0.2 12/05/2021 1501   NITRITE NEGATIVE 05/26/2022 1203   LEUKOCYTESUR NEGATIVE 05/26/2022 1203   Sepsis Labs Recent Labs  Lab 05/25/22 1318 05/26/22 0123 05/27/22 0420  WBC 18.8* 13.6* 9.9   Microbiology Recent Results (from the past 240 hour(s))  Culture, blood (Routine x 2)     Status: None (Preliminary result)   Collection Time: 05/25/22  1:18 PM   Specimen: BLOOD  Result Value Ref Range Status   Specimen Description BLOOD RIGHT ANTECUBITAL  Final   Special Requests   Final    BOTTLES DRAWN AEROBIC AND ANAEROBIC Blood Culture adequate volume   Culture   Final    NO GROWTH 2 DAYS Performed at [facility], [address] [place], [place] [address]    Report Status PENDING  Incomplete  Culture, blood (Routine x 2)     Status: None (Preliminary result)   Collection Time: 05/25/22  2:20 PM   Specimen: BLOOD RIGHT FOREARM  Result Value Ref Range Status   Specimen Description BLOOD RIGHT FOREARM  Final   Special Requests   Final    BOTTLES DRAWN AEROBIC AND ANAEROBIC Blood Culture results may not be optimal due to an excessive volume of blood received in culture bottles   Culture   Final    NO GROWTH 2 DAYS Performed at [facility], [address], [place], [place] [address]    Report Status PENDING  Incomplete  SARS Coronavirus 2 by RT PCR (hospital order, performed in [facility] hospital lab) *cepheid single result test* Anterior Nasal Swab     Status:  None   Collection Time: 05/25/22  4:00 PM   Specimen: Anterior Nasal Swab  Result Value Ref Range Status   SARS Coronavirus 2 by RT PCR NEGATIVE NEGATIVE Final    Comment: Performed at [facility], [address] [place], [place] [address]  Expectorated Sputum Assessment w Gram Stain, Rflx to Resp Cult      Status: None   Collection Time: 05/26/22 10:17 AM   Specimen: Sputum  Result Value Ref Range Status   Specimen Description SPU  Final   Special Requests  EXPECTORATED  Final   Sputum evaluation   Final    Sputum specimen not acceptable for testing.  Please recollect.    Roselyn Meier, RN 05/26/22 1242 A. LAFRANCE Performed at [facility], [address] [place], [place] [address]    Report Status 05/26/2022 FINAL  Final  Expectorated Sputum Assessment w Gram Stain, Rflx to Resp Cult     Status: None   Collection Time: 05/26/22 12:45 PM   Specimen: Expectorated Sputum  Result Value Ref Range Status   Specimen Description EXPECTORATED SPUTUM  Final   Special Requests Normal  Final   Sputum evaluation   Final    THIS SPECIMEN IS ACCEPTABLE FOR SPUTUM CULTURE Performed at [facility], [address], [place], [place] [address]    Report Status 05/26/2022 FINAL  Final  Culture, Respiratory w Gram Stain     Status: None (Preliminary result)   Collection Time: 05/26/22 12:45 PM  Result Value Ref Range Status   Specimen Description EXPECTORATED SPUTUM  Final   Special Requests Normal Reflexed from [id number]  Final   Gram Stain   Final    ABUNDANT WBC PRESENT, PREDOMINANTLY PMN NO ORGANISMS SEEN Performed at [facility], [address] [place], [place] [address]    Culture PENDING  Incomplete   Report Status PENDING  Incomplete     Time coordinating discharge: 35 minutes  SIGNED:   Dorcas Carrow, MD  [facility] 05/27/2022, 10:28 AM

## 2022-05-27 NOTE — Plan of Care (Signed)
  Problem: Activity: Goal: Ability to tolerate increased activity will improve Outcome: Adequate for Discharge   Problem: Clinical Measurements: Goal: Ability to maintain a body temperature in the normal range will improve Outcome: Adequate for Discharge   Problem: Respiratory: Goal: Ability to maintain adequate ventilation will improve Outcome: Adequate for Discharge Goal: Ability to maintain a clear airway will improve Outcome: Adequate for Discharge   Problem: Education: Goal: Knowledge of General Education information will improve Description: Including pain rating scale, medication(s)/side effects and non-pharmacologic comfort measures Outcome: Adequate for Discharge   Problem: Health Behavior/Discharge Planning: Goal: Ability to manage health-related needs will improve Outcome: Adequate for Discharge   Problem: Clinical Measurements: Goal: Ability to maintain clinical measurements within normal limits will improve Outcome: Adequate for Discharge Goal: Will remain free from infection Outcome: Adequate for Discharge Goal: Diagnostic test results will improve Outcome: Adequate for Discharge Goal: Respiratory complications will improve Outcome: Adequate for Discharge Goal: Cardiovascular complication will be avoided Outcome: Adequate for Discharge   Problem: Activity: Goal: Risk for activity intolerance will decrease Outcome: Adequate for Discharge   Problem: Nutrition: Goal: Adequate nutrition will be maintained Outcome: Adequate for Discharge   Problem: Coping: Goal: Level of anxiety will decrease Outcome: Adequate for Discharge   Problem: Elimination: Goal: Will not experience complications related to bowel motility Outcome: Adequate for Discharge Goal: Will not experience complications related to urinary retention Outcome: Adequate for Discharge   Problem: Pain Managment: Goal: General experience of comfort will improve Outcome: Adequate for Discharge    Problem: Safety: Goal: Ability to remain free from injury will improve Outcome: Adequate for Discharge   Problem: Skin Integrity: Goal: Risk for impaired skin integrity will decrease Outcome: Adequate for Discharge   Problem: Activity: Goal: Ability to tolerate increased activity will improve Outcome: Adequate for Discharge   Problem: Respiratory: Goal: Ability to maintain a clear airway will improve Outcome: Adequate for Discharge   Problem: Activity: Goal: Ability to tolerate increased activity will improve Outcome: Adequate for Discharge   Problem: Clinical Measurements: Goal: Ability to maintain a body temperature in the normal range will improve Outcome: Adequate for Discharge   Problem: Respiratory: Goal: Ability to maintain adequate ventilation will improve Outcome: Adequate for Discharge Goal: Ability to maintain a clear airway will improve Outcome: Adequate for Discharge

## 2022-05-27 NOTE — Plan of Care (Signed)
  Problem: Activity: Goal: Ability to tolerate increased activity will improve Outcome: Progressing   Problem: Activity: Goal: Ability to tolerate increased activity will improve Outcome: Progressing   Problem: Health Behavior/Discharge Planning: Goal: Ability to manage health-related needs will improve Outcome: Progressing

## 2022-05-27 NOTE — Progress Notes (Signed)
Physical Therapy Treatment Patient Details Name: Jacqueline Ortiz MRN: [id number] DOB: 25-Oct-1945 Today's Date: 05/27/2022   History of Present Illness Pt is a 77 yo female presenting 05/25/22 with cough and weakness; workup for hypoxia secondary to CAP. PMH includes HLD, HTN, anxiety, scoliosis, breast cancer s/p lumpectomy.   PT Comments    Pt progressing with mobility. Today's session focused on ambulation for improving strength and activity tolerance; pt with significant productive cough with mobility, able to clear secretions well. Increased time educ re: activity recommendations, pulmonary hygiene (including flutter valve/IS use) and importance of frequent mobility. Pt preparing for d/c home, reports no further questions or concerns; will have necessary assist from husband. If to remain admitted, will continue to follow acutely.   HR 105, SpO2 91% on RA with ambulation    Recommendations for follow up therapy are one component of a multi-disciplinary discharge planning process, led by the attending physician.  Recommendations may be updated based on patient status, additional functional criteria and insurance authorization.  Follow Up Recommendations       Assistance Recommended at Discharge Intermittent Supervision/Assistance  Patient can return home with the following Assistance with cooking/housework;Assist for transportation;Help with stairs or ramp for entrance   Equipment Recommendations  None recommended by PT    Recommendations for Other Services       Precautions / Restrictions Precautions Precautions: None Restrictions Weight Bearing Restrictions: No     Mobility  Bed Mobility Overal bed mobility: Modified Independent Bed Mobility: Supine to Sit                Transfers Overall transfer level: Independent Equipment used: None Transfers: Sit to/from Stand                  Ambulation/Gait Ambulation/Gait assistance: Chief Operating Officer  (Feet): 400 Feet Assistive device: None Gait Pattern/deviations: Step-through pattern, Decreased stride length Gait velocity: Decreased     General Gait Details: slow, guarded gait without DME, supervision for safety; frequent productive cough with ambulation; 1x seated rest break secondary to fatigue and SOB with significant coughing   Stairs             Wheelchair Mobility    Modified Rankin (Stroke Patients Only)       Balance Overall balance assessment: Needs assistance Sitting-balance support: No upper extremity supported Sitting balance-Leahy Scale: Good     Standing balance support: No upper extremity supported, During functional activity Standing balance-Leahy Scale: Good                              Cognition Arousal/Alertness: Awake/alert Behavior During Therapy: WFL for tasks assessed/performed Overall Cognitive Status: Within Functional Limits for tasks assessed                                          Exercises      General Comments General comments (skin integrity, edema, etc.): productive cough with upright mobility and incentive spirometer use this session, pt able to clear secretions well. pt's husband present and supportive; preparing for d/c home today. increased time educating activity recmomendations and pulmonary hygiene, including frequent mobility/ambulation at home, flutter valve and IS use      Pertinent Vitals/Pain Pain Assessment Pain Assessment: No/denies pain    Home Living  Prior Function            PT Goals (current goals can now be found in the care plan section) Progress towards PT goals: Progressing toward goals    Frequency    Min 3X/week      PT Plan Current plan remains appropriate    Co-evaluation              AM-PAC PT "6 Clicks" Mobility   Outcome Measure  Help needed turning from your back to your side while in a flat bed without  using bedrails?: None Help needed moving from lying on your back to sitting on the side of a flat bed without using bedrails?: None Help needed moving to and from a bed to a chair (including a wheelchair)?: None Help needed standing up from a chair using your arms (e.g., wheelchair or bedside chair)?: None Help needed to walk in hospital room?: A Little Help needed climbing 3-5 steps with a railing? : A Little 6 Click Score: 22    End of Session   Activity Tolerance: Patient tolerated treatment well Patient left: with call bell/phone within reach;with family/visitor present (seated EOB) Nurse Communication: Mobility status PT Visit Diagnosis: Other abnormalities of gait and mobility (R26.89)     Time: 1036-1100 PT Time Calculation (min) (ACUTE ONLY): 24 min  Charges:  $Therapeutic Exercise: 8-22 mins $Self Care/Home Management: 8-22                      Ina Homes, PT, DPT Acute Rehabilitation Services  Personal: Secure Chat Rehab Office: [phone]  Malachy Chamber 05/27/2022, 12:27 PM

## 2022-05-28 LAB — CULTURE, RESPIRATORY W GRAM STAIN: Culture: NORMAL

## 2022-05-30 LAB — CULTURE, BLOOD (ROUTINE X 2)
Culture: NO GROWTH
Culture: NO GROWTH
Special Requests: ADEQUATE

## 2022-06-04 ENCOUNTER — Telehealth: Payer: Self-pay

## 2022-06-04 NOTE — Telephone Encounter (Signed)
Jacqueline Ortiz is a 77 y.o. female called in requesting a new XL inflatoball pessary. Pt said she left hers out and it has turned black. I explained we do not have any in stock so it has to be ordered and we will call her when it arrives.

## 2022-06-10 NOTE — Telephone Encounter (Signed)
XL inflatoball has been ordered. I will call patient when it becomes available.

## 2022-06-12 ENCOUNTER — Other Ambulatory Visit: Payer: Self-pay | Admitting: Internal Medicine

## 2022-06-12 ENCOUNTER — Ambulatory Visit
Admission: RE | Admit: 2022-06-12 | Discharge: 2022-06-12 | Disposition: A | Discharge status: Home / Self Care | Payer: Medicare Other | Source: Ambulatory Visit | Attending: Internal Medicine | Admitting: Internal Medicine

## 2022-06-12 DIAGNOSIS — K5900 Constipation, unspecified: Secondary | ICD-10-CM

## 2022-06-23 IMAGING — MG MM BREAST SURGICAL SPECIMEN
1 series · 1 of 1 positions shown · non-contrast
Comparison: Previous exam(s).

CLINICAL DATA: Status post seed localized lumpectomy of the RIGHT
breast.

EXAM:
SPECIMEN RADIOGRAPH OF THE RIGHT BREAST

[R]
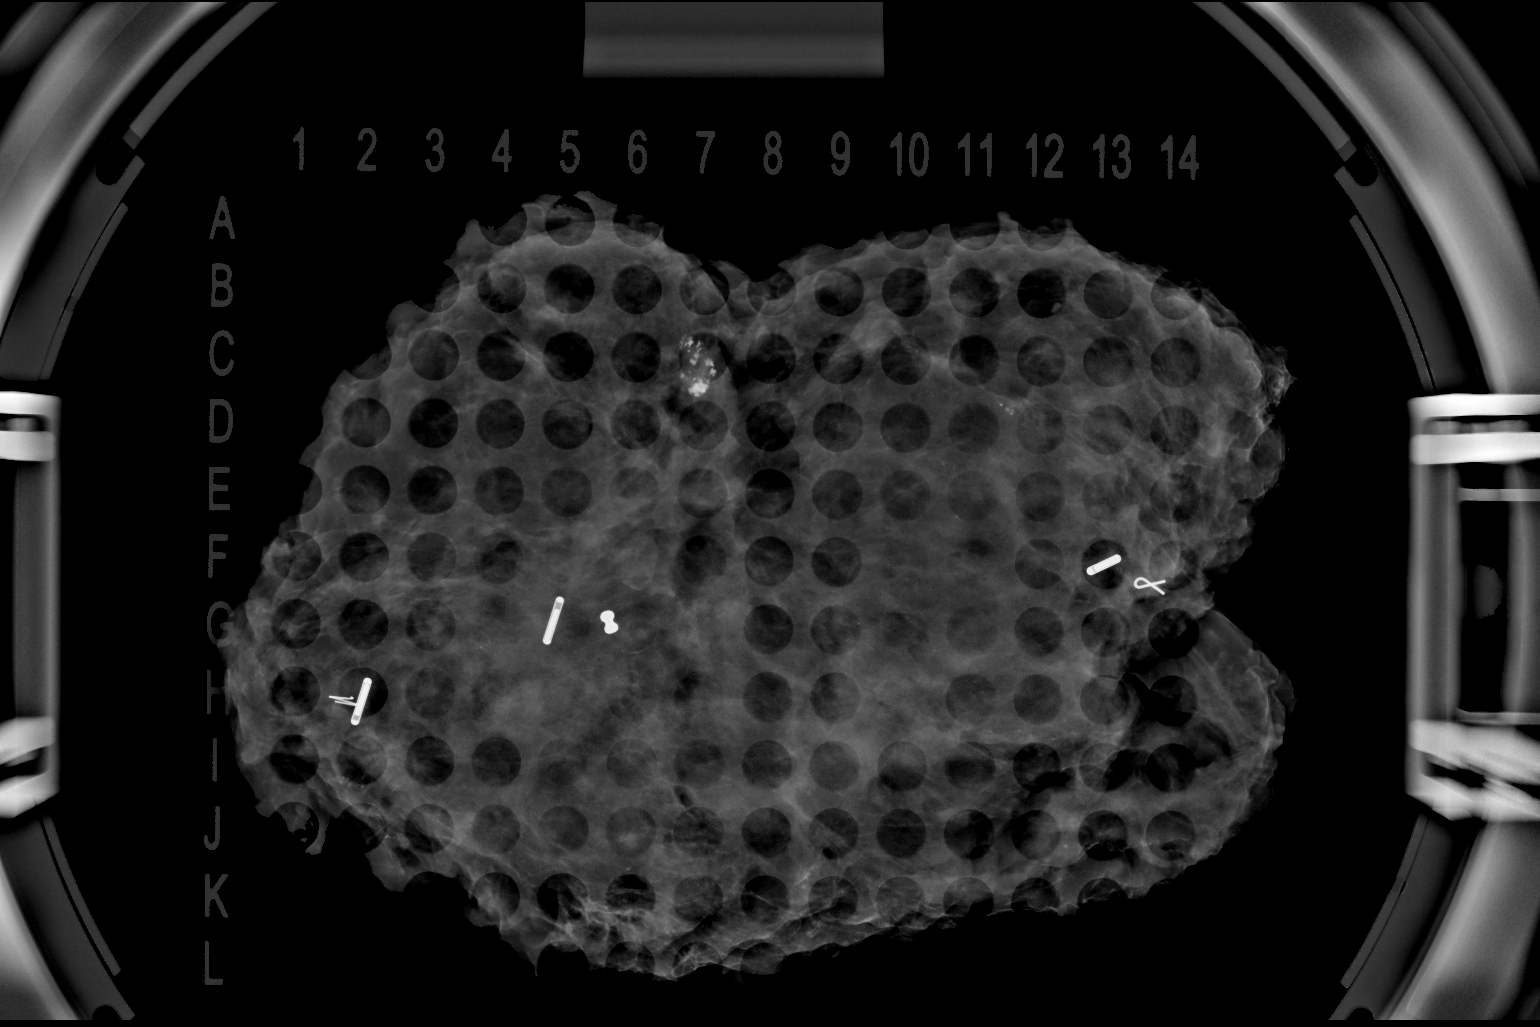

[1 of 1 positions shown; findings below may reference images not displayed]

FINDINGS: Status post excision of the RIGHT breast. Within the specimen, there
are 3 radioactive seeds and 3 tissue marker clips.

The heart shaped clip is identified adjacent to a radioactive seed.

The barbell shaped clip is identified adjacent to a radioactive
seed.

The ribbon shaped clip is adjacent to a radioactive seed. These
lesions are marked for pathology.
IMPRESSION: Specimen radiograph of the right breast.

## 2022-10-09 ENCOUNTER — Ambulatory Visit: Payer: Medicare Other | Admitting: Obstetrics and Gynecology

## 2022-10-09 ENCOUNTER — Encounter: Payer: Self-pay | Admitting: Obstetrics and Gynecology

## 2022-10-09 VITALS — BP 130/83 | HR 89

## 2022-10-09 DIAGNOSIS — N813 Complete uterovaginal prolapse: Secondary | ICD-10-CM | POA: Diagnosis not present

## 2022-10-09 DIAGNOSIS — N8110 Cystocele, unspecified: Secondary | ICD-10-CM

## 2022-10-09 DIAGNOSIS — N816 Rectocele: Secondary | ICD-10-CM

## 2022-10-09 NOTE — Progress Notes (Signed)
[facility]   Subjective:     Chief Complaint:  Chief Complaint  Patient presents with   Pessary Check   6 month pessary check   History of Present Illness: Jacqueline Ortiz is a 77 y.o. female with stage IV pelvic organ prolapse who presents for a pessary check. She is using a size XL Inflatoball  pessary. The pessary has been working well but it has started to turn a different color. She is using vaginal estrogen. She denies vaginal bleeding.  Past Medical History: Patient  has a past medical history of Allergic rhinosinusitis (2008), Arthritis (2023), Breast cancer (HCC), Diverticulosis, Dry eyes, bilateral (2000), Duodenal diverticulum, Dyslipidemia (2008), Eczema (1993), Esophageal stricture (2008), Hemorrhoids (2003), History of hiatal hernia, Hyperlipidemia (2008), Hypertension (2021), Nephrolithiasis (2017), Osteoporosis (2003), Panic disorder, Paralyzed vocal cords (1993), Scoliosis deformity of spine, and Tubular adenoma of colon (09/06/2004).   Past Surgical History: She  has a past surgical history that includes Tubal ligation; Colonoscopy (2022); Cesarean section; Dilation and curettage of uterus; Knee surgery (Right); vocal cord surgery; Esophagogastroduodenoscopy (02/02/2003); Tonsillectomy (1953); Esophagogastroduodenoscopy (N/A, 10/21/2015); Upper gastrointestinal endoscopy; Breast biopsy (Right, 03/15/2021); Breast biopsy (Right, 04/18/2021); Hiatal hernia repair; Nissen fundoplication (2018); Breast lumpectomy with radioactive seed localization (Right, 05/01/2021); and Sentinel node biopsy (Right, 05/01/2021).   Medications: She has a current medication list which includes the following prescription(s): atorvastatin, calcium-magnesium-vitamin d, estradiol, ketotifen fumarate, loratadine, and preparation h.   Allergies: Patient has No Known Allergies.   Social History: Patient  reports that she has never smoked. She has never used smokeless tobacco. She reports  that she does not drink alcohol and does not use drugs.      Objective:    Physical Exam: BP 130/83 (BP Location: Left Arm, Patient Position: Sitting, Cuff Size: Normal)   Pulse 89   SpO2 94%  Gen: No apparent distress, A&O x 3. Detailed Urogynecologic Evaluation:  Pelvic Exam: Normal external female genitalia; Bartholin's and Skene's glands normal in appearance; urethral meatus normal in appearance, no urethral masses or discharge. The pessary was noted to be in place. It was removed and Speculum exam revealed no lesions in the vagina. A new inflatoball pessary (LOT [id number]) was placed. It was comfortable to the patient and fit well.    Assessment/Plan:    Assessment: [name] is a 77 y.o. with stage IV pelvic organ prolapse here for a pessary check. She is doing well.  Plan: She will remove once a week . She will continue to use estrogen. She will follow-up in 1 years for a pessary check or sooner as needed.  All questions were answered.

## 2022-11-17 ENCOUNTER — Ambulatory Visit: Payer: Medicare Other | Admitting: Obstetrics and Gynecology

## 2022-11-18 ENCOUNTER — Encounter: Payer: Self-pay | Admitting: Obstetrics and Gynecology

## 2022-11-18 ENCOUNTER — Ambulatory Visit: Payer: Medicare Other | Admitting: Obstetrics and Gynecology

## 2022-11-18 VITALS — BP 121/77 | HR 91

## 2022-11-18 DIAGNOSIS — N8110 Cystocele, unspecified: Secondary | ICD-10-CM

## 2022-11-18 DIAGNOSIS — R159 Full incontinence of feces: Secondary | ICD-10-CM

## 2022-11-18 DIAGNOSIS — K623 Rectal prolapse: Secondary | ICD-10-CM

## 2022-11-18 DIAGNOSIS — N816 Rectocele: Secondary | ICD-10-CM

## 2022-11-18 DIAGNOSIS — N813 Complete uterovaginal prolapse: Secondary | ICD-10-CM

## 2022-11-18 NOTE — Progress Notes (Signed)
[facility]   Subjective:     Chief Complaint:  Chief Complaint  Patient presents with   Pessary issue    Jacqueline Ortiz is a 77 y.o. female here for pessary issues.   History of Present Illness: Jacqueline Ortiz is a 77 y.o. female with stage IV pelvic organ prolapse who presents for a pessary check. She is using a size XL Inflatoball pessary. The pessary has been working well but recently the pump stopped being able to inflate the pessary. She is using vaginal estrogen. She denies vaginal bleeding.  Past Medical History: Patient  has a past medical history of Allergic rhinosinusitis (2008), Arthritis (2023), Breast cancer (HCC), Diverticulosis, Dry eyes, bilateral (2000), Duodenal diverticulum, Dyslipidemia (2008), Eczema (1993), Esophageal stricture (2008), Hemorrhoids (2003), History of hiatal hernia, Hyperlipidemia (2008), Hypertension (2021), Nephrolithiasis (2017), Osteoporosis (2003), Panic disorder, Paralyzed vocal cords (1993), Scoliosis deformity of spine, and Tubular adenoma of colon (09/06/2004).   Past Surgical History: She  has a past surgical history that includes Tubal ligation; Colonoscopy (2022); Cesarean section; Dilation and curettage of uterus; Knee surgery (Right); vocal cord surgery; Esophagogastroduodenoscopy (02/02/2003); Tonsillectomy (1953); Esophagogastroduodenoscopy (N/A, 10/21/2015); Upper gastrointestinal endoscopy; Breast biopsy (Right, 03/15/2021); Breast biopsy (Right, 04/18/2021); Hiatal hernia repair; Nissen fundoplication (2018); Breast lumpectomy with radioactive seed localization (Right, 05/01/2021); and Sentinel node biopsy (Right, 05/01/2021).   Medications: She has a current medication list which includes the following prescription(s): atorvastatin, calcium-magnesium-vitamin d, estradiol, ketotifen fumarate, loratadine, and preparation h.   Allergies: Patient has No Known Allergies.   Social History: Patient  reports that she has never  smoked. She has never used smokeless tobacco. She reports that she does not drink alcohol and does not use drugs.      Objective:    Physical Exam: BP 121/77   Pulse 91  Gen: No apparent distress, A&O x 3. Detailed Urogynecologic Evaluation:  Pelvic Exam: Normal external female genitalia; Bartholin's and Skene's glands normal in appearance; urethral meatus normal in appearance, no urethral masses or discharge. The pessary was noted to be dislodged. Attempted to trouble-shoot pessary and a small black piece had fallen from the pump. As the pump does not come separate from the pessary, a new pessary was provided for patient today.   Of note, during assist to insert pessary, patient had fecal loss and there was rectal prolapse visible on exam. She reports she has never seen GI for this.   Assessment/Plan:    Assessment: Ms. Helgesen is a 77 y.o. with stage IV pelvic organ prolapse here for a pessary check. She is doing well.  Plan: She will remove daily . She will continue to use estrogen. She will follow-up in 6 months for a pessary check or sooner as needed.   Ambulatory referral placed to GI for rectal prolapse visible on exam today.   All questions were answered.   Time Spent:

## 2022-11-19 ENCOUNTER — Telehealth: Payer: Self-pay

## 2022-11-19 NOTE — Telephone Encounter (Signed)
Patient calls today with concerns with her pessary. She indicates she would like to discuss surgical options as current her pessary keeps falling out. Would you like her to schedule a follow up to discuss?

## 2022-11-19 NOTE — Telephone Encounter (Signed)
Please schedule patient with Dr. Florian Buff to discuss surgical options. She will need urodynamics as well.

## 2022-11-19 NOTE — Telephone Encounter (Signed)
Patient has been notified and aware of appointment. Thank you Amy for getting her scheduled.

## 2022-12-02 ENCOUNTER — Telehealth: Payer: Self-pay | Admitting: Internal Medicine

## 2022-12-02 NOTE — Telephone Encounter (Signed)
Patient called regarding a referral for rectal prolapse and full incontinence of feces. Offered next appointment available and declined she requested to speak with a nurse.

## 2022-12-03 NOTE — Telephone Encounter (Signed)
Referral sent to [place] from patient's OB/GYN for rectal prolapse & incontinence of feces. Patient requesting to be seen sooner with Dr. Leone Payor. Offered sooner appt time with an APP, but pt prefers to be seen with MD. Scheduled OV for 12/17/22 at 8:50 am. Pt advised to be in touch with PCP & OB/GYN if symptoms worsen in the meantime, and to call back with any questions. Pt verbalized all understanding.

## 2022-12-16 ENCOUNTER — Other Ambulatory Visit: Payer: Self-pay | Admitting: Obstetrics and Gynecology

## 2022-12-16 ENCOUNTER — Encounter: Payer: Self-pay | Admitting: Obstetrics and Gynecology

## 2022-12-16 ENCOUNTER — Ambulatory Visit (INDEPENDENT_AMBULATORY_CARE_PROVIDER_SITE_OTHER): Payer: Medicare Other | Admitting: Obstetrics and Gynecology

## 2022-12-16 VITALS — BP 132/77 | HR 85

## 2022-12-16 DIAGNOSIS — N3281 Overactive bladder: Secondary | ICD-10-CM

## 2022-12-16 DIAGNOSIS — R948 Abnormal results of function studies of other organs and systems: Secondary | ICD-10-CM | POA: Diagnosis not present

## 2022-12-16 DIAGNOSIS — N393 Stress incontinence (female) (male): Secondary | ICD-10-CM

## 2022-12-16 DIAGNOSIS — R350 Frequency of micturition: Secondary | ICD-10-CM

## 2022-12-16 LAB — POCT URINALYSIS DIPSTICK
Bilirubin, UA: NEGATIVE
Blood, UA: NEGATIVE
Glucose, UA: NEGATIVE
Ketones, UA: NEGATIVE
Nitrite, UA: NEGATIVE
Protein, UA: NEGATIVE
Spec Grav, UA: <=1.005 — AB (ref 1.010–1.025)
Urobilinogen, UA: 0.2 U/dL
pH, UA: 7 (ref 5.0–8.0)

## 2022-12-16 MED ORDER — MIRABEGRON ER 50 MG PO TB24: 50.0000 mg | ORAL_TABLET | Freq: Every day | ORAL | 5 refills | Status: DC

## 2022-12-16 NOTE — Progress Notes (Signed)
[facility] Urodynamics Procedure  Referring Physician: Geoffry Paradise, MD Date of Procedure: 12/16/2022  Jacqueline Ortiz is a 77 y.o. female who presents for urodynamic evaluation. Indication(s) for study: SUI and OAB  Vital Signs: BP 132/77   Pulse 85   Laboratory Results: A clean catch urine specimen revealed:  Lab Results  Component Value Date   COLORU yellow 12/16/2022   CLARITYU clear 12/16/2022   GLUCOSEUR Negative 12/16/2022   BILIRUBINUR negative 12/16/2022   KETONESU negative 12/16/2022   SPECGRAV <=1.005 (A) 12/16/2022   RBCUR negative 12/16/2022   PHUR 7.0 12/16/2022   PROTEINUR Negative 12/16/2022   UROBILINOGEN 0.2 12/16/2022   LEUKOCYTESUR Trace (A) 12/16/2022      Voiding Diary: Not Done  Procedure Timeout:  The correct patient was verified and the correct procedure was verified. The patient was in the correct position and safety precautions were reviewed based on at the patient's history.  Urodynamic Procedure A 363F dual lumen urodynamics catheter was placed under sterile conditions into the patient's bladder. A 363F catheter was placed into the rectum in order to measure abdominal pressure. EMG patches were placed in the appropriate position.  All connections were confirmed and calibrations/adjusted made. Saline was instilled into the bladder through the dual lumen catheters.  Cough/valsalva pressures were measured periodically during filling.  Patient was allowed to void.  The bladder was then emptied of its residual.  UROFLOW: Patient unable to void for uroflow. PVR on initial cath was   CMG: This was performed with sterile water in the sitting position at a fill rate of 20-30 mL/min.    First sensation of fullness was 49 mLs,  First urge was 77 mLs,  Strong urge was 221 mLs and  Capacity was 281 mLs  Stress incontinence was demonstrated Highest positive Barrier CLPP was 55 cmH20 at 200 ml. Highest positive Barrier VLPP was 9 cmH20  at 200 ml.  Detrusor function was overactive, with phasic contractions seen.  The first occurred at 23 mL to 6 cm of water and was associated with urge.  Compliance:  Normal. End fill detrusor pressure was 9.1cmH20.  Calculated compliance was 70mL/cmH20  UPP: MUCP with barrier reduction was 30 cm of water.    MICTURITION STUDY: Voiding was performed with reduction using scopettes in the sitting position.  Pdet at Qmax was 16.6 cm of water.  Qmax was 5 mL/sec.  It was a normal pattern.  She voided 7 mL and had a residual of 275 mL.  It was a volitional void, sustained detrusor contraction was present and abdominal straining was not present  EMG: This was performed with patches.  She had voluntary contractions, recruitment with fill was present and urethral sphincter was not relaxed with void.  The details of the procedure with the study tracings have been scanned into EPIC.   Urodynamic Impression:  1. Sensation was increased; capacity was reduced 2. Stress Incontinence was demonstrated at normal pressures; 3. Detrusor Overactivity was demonstrated with leakage. 4. Emptying was dysfunctional with a elevated PVR (275), a sustained detrusor contraction present,  abdominal straining not present, dyssynergic urethral sphincter activity on EMG.  Plan: - The patient will follow up  to discuss the findings and treatment options.

## 2022-12-17 ENCOUNTER — Encounter: Payer: Self-pay | Admitting: Internal Medicine

## 2022-12-17 ENCOUNTER — Ambulatory Visit: Payer: Medicare Other | Admitting: Internal Medicine

## 2022-12-17 VITALS — BP 118/70 | HR 100 | Ht 61.5 in | Wt 124.0 lb

## 2022-12-17 DIAGNOSIS — R151 Fecal smearing: Secondary | ICD-10-CM

## 2022-12-17 DIAGNOSIS — K623 Rectal prolapse: Secondary | ICD-10-CM | POA: Diagnosis not present

## 2022-12-17 DIAGNOSIS — N814 Uterovaginal prolapse, unspecified: Secondary | ICD-10-CM

## 2022-12-17 NOTE — Patient Instructions (Signed)
Please keep your upcoming appointment with Dr Lanetta Inch .   _______________________________________________________  If your blood pressure at your visit was 140/90 or greater, please contact your primary care physician to follow up on this.  _______________________________________________________  If you are age 75 or older, your body mass index should be between 23-30. Your Body mass index is 23.05 kg/m. If this is out of the aforementioned range listed, please consider follow up with your Primary Care Provider.  If you are age 62 or younger, your body mass index should be between 19-25. Your Body mass index is 23.05 kg/m. If this is out of the aformentioned range listed, please consider follow up with your Primary Care Provider.   ________________________________________________________  The [facility] providers would like to encourage you to use [facility] to communicate with providers for non-urgent requests or questions.  Due to long hold times on the telephone, sending your provider a message by [facility] may be a faster and more efficient way to get a response.  Please allow 48 business hours for a response.  Please remember that this is for non-urgent requests.  _______________________________________________________  I appreciate the opportunity to care for you. Stan Head, MD, [facility]

## 2022-12-17 NOTE — Progress Notes (Signed)
Chief Complaint: Rectal prolapse and full incontinence of feces  HPI: Patient is a 77 year old female patient with medical history of chronic GERD, status post Nissan fundoplication, hypertension, hyperlipidemia, vocal cord paralysis, and scoliosis who was referred  on 11/18/2022 for a complaint of rectal prolapse and incontinence of feces.  On 09/04/2020 patient was last seen in our office for surveillance colonoscopy with Dr. Leone Payor which revealed internal hemorrhoids and diverticular disease, otherwise unremarkable.  On 12/17/2022 patient was seen by OB/GYN for urodynamic evaluation which showed stress incontinence, detrussor overactivity with leakage, emptying was dysfunctional. Pending appt with Dr. Florian Buff to discuss surgical options in 2 weeks.  On 11/18/2022 patient had appointment with OB/GYN for stage IV pelvic organ prolapse and pessary check.  At that time it was noted that patient may also have rectal prolapse on exam, referral for GI.   Interval history: Patient reports for the last several months she has had issues stool incontinence and pelvic fullness.  She reports she has a longstanding history of chronic constipation and at one time was using daily stool softeners.  She is currently having 3-4 formed stools per day and states recently she was experiencing diarrhea which stopped 2-3 weeks ago. Patient does wear depends as she states she will leak small amount of stool without warning.  Patient states at times she will have urgency with bowel movements and will not be able to to make it to the bathroom in time.  Patient does have history of hemorrhoids and states she will intermittently have bright red blood with wiping that resolves on own.  She is not currently on any fiber supplementation or medication for diarrhea or constipation.  Patient does have issues with urinary incontinence as well. Denies abdominal pain or rectal bleeding. Patient also has history of GERD complicated  with dysphagia which has required endoscopic dilatation, however patient states currently she is not having any issues.  Denies reflux symptoms.  Not currently on any antiacid medication.  Wt Readings from Last 3 Encounters:  12/17/22 124 lb (56.2 kg)  05/25/22 132 lb 11.5 oz (60.2 kg)  03/27/22 132 lb 12.8 oz (60.2 kg)    Past Medical History:  Diagnosis Date   Allergic rhinosinusitis 2008   usual daily sinus drainage and post nasal drip   Arthritis 2023   osteoarthritis   Breast cancer (HCC)    Diverticulosis    patient unsure of date of diagnosis   Dry eyes, bilateral 2000   Duodenal diverticulum    patient unsure of diagnosis date   Dyslipidemia 2008   Eczema 1993   Esophageal stricture 2008   Hemorrhoids 2003   internal and external- remains- treats as needed with topical gels   History of hiatal hernia    Hyperlipidemia 2008   Hypertension 2021   Nephrolithiasis 2017   Osteoporosis 2003   Panic disorder    patient reports anxiety most of her life   Paralyzed vocal cords 1993   30 years ago/ per pt unsure of cause" speaks in soft whispery tone is normal"   Scoliosis deformity of spine    very severe"can't lay flat with out cushion"- started as a young teenager   Tubular adenoma of colon 09/06/2004   Dr. Stan Head    Past Surgical History:  Procedure Laterality Date   BREAST BIOPSY Right 03/15/2021   BREAST BIOPSY Right 04/18/2021   BREAST LUMPECTOMY WITH RADIOACTIVE SEED LOCALIZATION Right 05/01/2021   Procedure: RIGHT BREAST LUMPECTOMY WITH RADIOACTIVE SEED LOCALIZATION X4;  Surgeon: Harriette Bouillon, MD;  Location: [facility] OR;  Service: General;  Laterality: Right;   CESAREAN SECTION     COLONOSCOPY  2022   Dr. Stan Head   DILATION AND CURETTAGE OF UTERUS     ESOPHAGOGASTRODUODENOSCOPY  02/02/2003   Dr. Stan Head   ESOPHAGOGASTRODUODENOSCOPY N/A 10/21/2015   Procedure: ESOPHAGOGASTRODUODENOSCOPY (EGD);  Surgeon: Rachael Fee, MD;  Location: [facility];   Service: Endoscopy;  Laterality: N/A;   HIATAL HERNIA REPAIR     KNEE SURGERY Right    right fx- retained hardware   NISSEN FUNDOPLICATION  2018   SENTINEL NODE BIOPSY Right 05/01/2021   Procedure: SENTINEL NODE BIOPSY;  Surgeon: Harriette Bouillon, MD;  Location: [facility] OR;  Service: General;  Laterality: Right;   TONSILLECTOMY  1953   age 3   TUBAL LIGATION     UPPER GASTROINTESTINAL ENDOSCOPY     vocal cord surgery     "did improve condition"    Current Outpatient Medications  Medication Sig Dispense Refill   atorvastatin (LIPITOR) 20 MG tablet Take 20 mg by mouth in the morning.     Calcium-Magnesium-Vitamin D 500-50-100 MG-MG-UNIT CHEW Chew 2 capsules by mouth 2 (two) times daily.     escitalopram (LEXAPRO) 10 MG tablet 1 tablet Orally Once a day for 90 days     estradiol (ESTRACE) 0.1 MG/GM vaginal cream Place 0.5 g vaginally 2 (two) times a week. Place 0.5g nightly for two weeks then twice a week after 30 g 11   Ketotifen Fumarate (REFRESH EYE ITCH RELIEF OP) Place 1 drop into both eyes 4 (four) times daily as needed (eye irritation.).     loratadine (CLARITIN) 10 MG tablet Take 10 mg by mouth in the morning.     phenylephrine (SUDAFED PE) 10 MG TABS tablet Take 10 mg by mouth every 4 (four) hours as needed.     Phenylephrine-Witch Hazel (PREPARATION H) 0.25-50 % GEL Apply 1 application  topically daily as needed (irritation/discomfort).     triamterene-hydrochlorothiazide (MAXZIDE-25) 37.5-25 MG tablet Take 1 tablet by mouth daily.     mirabegron ER (MYRBETRIQ) 50 MG TB24 tablet Take 1 tablet (50 mg total) by mouth daily. (Patient not taking: Reported on 12/17/2022) 30 tablet 5   No current facility-administered medications for this visit.    Allergies as of 12/17/2022   (No Known Allergies)    Family History  Problem Relation Age of Onset   Heart disease Mother    Diabetes Mother    Melanoma Father    Heart disease Father    Arthritis Father    Heart disease Sister    Skin  cancer Brother        patient unsure of what type of skin cancer brother had   Colon polyps Brother    Colon cancer Maternal Grandmother 52   Cancer Maternal Aunt 65       sinus cancer   Esophageal cancer Neg Hx    Rectal cancer Neg Hx    Stomach cancer Neg Hx    Breast cancer Neg Hx     Social History   Socioeconomic History   Marital status: Married    Spouse name: Not on file   Number of children: 1   Years of education: Not on file   Highest education level: Not on file  Occupational History   Not on file  Tobacco Use   Smoking status: Never   Smokeless tobacco: Never  Vaping Use   Vaping status: Never  Used  Substance and Sexual Activity   Alcohol use: No   Drug use: No   Sexual activity: Yes    Birth control/protection: None    Comment: Post menopause  Other Topics Concern   Not on file  Social History Narrative   Not on file   Social Determinants of Health   Financial Resource Strain: Not on file  Food Insecurity: Not on file  Transportation Needs: Not on file  Physical Activity: Not on file  Stress: Not on file  Social Connections: Not on file  Intimate Partner Violence: Not on file    Review of Systems:    Constitutional: No weight loss, fever, chills, weakness or fatigue HEENT: Eyes: No change in vision               Ears, Nose, Throat:  No change in hearing or congestion Skin: No rash or itching Cardiovascular: No chest pain, chest pressure or palpitations   Respiratory: No SOB or cough Gastrointestinal: See HPI and otherwise negative Genitourinary: No dysuria or change in urinary frequency Neurological: No headache, dizziness or syncope Musculoskeletal: No new muscle or joint pain Hematologic: No bleeding or bruising Psychiatric: No history of depression or anxiety    Physical Exam:  Vital signs: BP 118/70   Pulse 100   Ht 5' 1.5" (1.562 m)   Wt 124 lb (56.2 kg)   BMI 23.05 kg/m   Wt Readings from Last 3 Encounters:  12/17/22 124 lb  (56.2 kg)  05/25/22 132 lb 11.5 oz (60.2 kg)  03/27/22 132 lb 12.8 oz (60.2 kg)     Constitutional:   Pleasant Caucasian female appears to be in NAD, Well developed, Well nourished, alert and cooperative Throat: Oral cavity and pharynx without inflammation, swelling or lesion.  Respiratory: Respirations even and unlabored. Lungs clear to auscultation bilaterally.   No wheezes, crackles, or rhonchi.  Cardiovascular: Normal S1, S2. No MRG. Regular rate and rhythm. No peripheral edema, cyanosis or pallor.  Gastrointestinal:  Soft, nondistended, nontender. No rebound or guarding. Normal bowel sounds. No appreciable masses or hepatomegaly. GYN: ureterovaginal prolapse noted on exam Rectal: prolapse rectum noted in defecation position, internal hemorrhoids with normal DRE. Msk:  Symmetrical without gross deformities. Without edema, no deformity or joint abnormality.   RELEVANT LABS AND IMAGING: CBC    Latest Ref Rng & Units 05/27/2022    4:20 AM 05/26/2022    1:23 AM 05/25/2022    1:18 PM  CBC  WBC 4.0 - 10.5 K/uL 9.9  13.6  18.8   Hemoglobin 12.0 - 15.0 g/dL 95.6  21.3  08.6   Hematocrit 36.0 - 46.0 % 37.3  35.7  44.4   Platelets 150 - 400 K/uL 280  295  364     CMP     Component Value Date/Time   NA 133 (L) 05/27/2022 0420   K 3.7 05/27/2022 0420   CL 104 05/27/2022 0420   CO2 24 05/27/2022 0420   GLUCOSE 99 05/27/2022 0420   BUN 6 (L) 05/27/2022 0420   CREATININE 0.78 05/27/2022 0420   CREATININE 0.94 03/27/2021 1238   CALCIUM 8.6 (L) 05/27/2022 0420   PROT 7.6 05/25/2022 1318   ALBUMIN 3.0 (L) 05/25/2022 1318   AST 30 05/25/2022 1318   AST 24 03/27/2021 1238   ALT 44 05/25/2022 1318   ALT 22 03/27/2021 1238   ALKPHOS 91 05/25/2022 1318   BILITOT 0.9 05/25/2022 1318   BILITOT 0.4 03/27/2021 1238   GFRNONAA >60 05/27/2022 0420  GFRNONAA >60 03/27/2021 1238   GFRAA >60 02/07/2016 0458   Imaging/Labs:  09/04/2020 colonoscopy. Recall 09/2023 Impression:  - Hemorrhoids  found on perianal exam.  - Diverticulosis in the sigmoid colon and in the descending colon. - External and internal hemorrhoids.  - The examination was otherwise normal on direct and retroflexion views. - No specimens collected.  - Personal history of colonic polyps. diminutive adenoma 2006, 7 mm TV adenoma 2013, no polyps 2016  11/12/2025 EGD Impression:  - Benign appearing esophageal stenosis. Dilated.  - Large hiatal hernia.  - Acquired duodenal stenosis.  - The examination was otherwise normal.  - No specimens collected.  12/22/2021 abdominal x-ray IMPRESSION: Nonobstructed gas pattern with moderate stool burden. Moderate fecal impaction at the rectum    05/27/2022 labs show: Normal magnesium and phosphorus level.  WBC 9.9.  Hemoglobin 12.3.  Platelets 280.  Sodium 133.  BUN 6/creatinine 0.78.  Calcium 8.6.  Assessment:  77 year old female patient that has longstanding history of chronic constipation who presents with issues with stool incontinence and fullness in pelvic region. On physical examination it is noted that she has stage IV pelvic organ prolapse and rectal prolapse in the defecation position. She has appt with Dr. Florian Buff to discuss surgical options in 2 weeks. She would probably benefit from surgical procedure that repairs both.    Encounter Diagnoses  Name Primary?   Rectal prolapse Yes   Prolapse, uterovaginal    Fecal smearing     Plan: -Defer to urogynecologist as this is something where she will probably benefit from a surgical procedure that simultaneously repairs both.  Deanna May, FNP-[facility] 12/17/2022, 11:22 AM    I saw the patient in conjunction with Ms. May and she served as a Neurosurgeon for this visit. Iva Boop, MD, [facility]  Copy to Lanetta Inch, MD

## 2022-12-30 ENCOUNTER — Ambulatory Visit: Payer: Medicare Other | Admitting: Obstetrics and Gynecology

## 2022-12-30 ENCOUNTER — Encounter: Payer: Self-pay | Admitting: Obstetrics and Gynecology

## 2022-12-30 VITALS — BP 130/77 | HR 96

## 2022-12-30 DIAGNOSIS — N813 Complete uterovaginal prolapse: Secondary | ICD-10-CM

## 2022-12-30 DIAGNOSIS — K623 Rectal prolapse: Secondary | ICD-10-CM | POA: Diagnosis not present

## 2022-12-30 NOTE — Progress Notes (Signed)
[facility] Return Visit  SUBJECTIVE  History of Present Illness: Jacqueline Ortiz is a 77 y.o. female seen in follow-up for stage IV prolapse. She underwent urodynamic testing.  Had been using an inflatoball pessary but no longer working well for her and she is interested in surgery. She had surgery a few years ago for a hiatal hernia and after the surgery she had severe back pain due to her scoliosis. So she is concerned about having another surgery.   Currently having large amounts of leakage. Not emptying bladder well.   Urodynamic Impression:  1. Sensation was increased; capacity was reduced 2. Stress Incontinence was demonstrated at normal pressures; 3. Detrusor Overactivity was demonstrated with leakage. 4. Emptying was dysfunctional with a elevated PVR (275), a sustained detrusor contraction present,  abdominal straining not present, dyssynergic urethral sphincter activity on EMG.  Past Medical History: Patient  has a past medical history of Allergic rhinosinusitis (2008), Arthritis (2023), Breast cancer (HCC), Diverticulosis, Dry eyes, bilateral (2000), Duodenal diverticulum, Dyslipidemia (2008), Eczema (1993), Esophageal stricture (2008), Hemorrhoids (2003), History of hiatal hernia, Hyperlipidemia (2008), Hypertension (2021), Nephrolithiasis (2017), Osteoporosis (2003), Panic disorder, Paralyzed vocal cords (1993), Scoliosis deformity of spine, and Tubular adenoma of colon (09/06/2004).   Past Surgical History: She  has a past surgical history that includes Tubal ligation; Colonoscopy (2022); Cesarean section; Dilation and curettage of uterus; Knee surgery (Right); vocal cord surgery; Esophagogastroduodenoscopy (02/02/2003); Tonsillectomy (1953); Esophagogastroduodenoscopy (N/A, 10/21/2015); Upper gastrointestinal endoscopy; Breast biopsy (Right, 03/15/2021); Breast biopsy (Right, 04/18/2021); Hiatal hernia repair; Nissen fundoplication (2018); Breast lumpectomy with  radioactive seed localization (Right, 05/01/2021); and Sentinel node biopsy (Right, 05/01/2021).   Medications: She has a current medication list which includes the following prescription(s): atorvastatin, calcium-magnesium-vitamin d, escitalopram, estradiol, ketotifen fumarate, loratadine, mirabegron er, phenylephrine, preparation h, and triamterene-hydrochlorothiazide.   Allergies: Patient has No Known Allergies.   Social History: Patient  reports that she has never smoked. She has never used smokeless tobacco. She reports that she does not drink alcohol and does not use drugs.      OBJECTIVE     Physical Exam: Vitals:   12/30/22 1020  BP: 130/77  Pulse: 96   Gen: No apparent distress, A&O x 3.  Detailed Urogynecologic Evaluation:  Deferred. Prior exam showed:  POP-Q   3                                            Aa   6                                           Ba   6                                              C    6                                            Gh   4  Pb   8                                            tvl    3                                            Ap   6                                            Bp   -3                                              D      ASSESSMENT AND PLAN    Jacqueline Ortiz is a 77 y.o. with:  1. Uterovaginal prolapse, complete   2. Rectal prolapse     Uterovaginal prolapse, complete  Rectal prolapse -     Ambulatory referral to Colorectal Surgery  - rectal prolapse previously noted and has seen GI. We discussed that she would benefit from a referral to Colorectal surgery. Can potentially plan for a joint procedure to fix both vaginal and rectal prolapse.   Plan for surgery: Exam under anesthesia, robotic assisted total laparoscopic hysterectomy with bilateral salpingo-oophorectomy, sacrocolpopexy, perineorrhaphy, cystoscopy, possible posterior repair  - We reviewed the  patient's specific anatomic and functional findings, with the assistance of diagrams, and together finalized the above procedure. The planned surgical procedures were discussed along with the surgical risks outlined below, which were also provided on a detailed handout. Additional treatment options including expectant management, conservative management, medical management were discussed where appropriate.  We reviewed the benefits and risks of each treatment option.   General Surgical Risks: For all procedures, there are risks of bleeding, infection, damage to surrounding organs including but not limited to bowel, bladder, blood vessels, ureters and nerves, and need for further surgery if an injury were to occur. These risks are all low with minimally invasive surgery.   There are risks of numbness and weakness at any body site or buttock/rectal pain.  It is possible that baseline pain can be worsened by surgery, either with or without mesh. If surgery is vaginal, there is also a low risk of possible conversion to laparoscopy or open abdominal incision where indicated. Very rare risks include blood transfusion, blood clot, heart attack, pneumonia, or death.   There is also a risk of short-term postoperative urinary retention with need to use a catheter. About half of patients need to go home from surgery with a catheter, which is then later removed in the office. The risk of long-term need for a catheter is very low. There is also a risk of worsening of overactive bladder.   Prolapse (with or without mesh): Risk factors for surgical failure  include things that put pressure on your pelvis and the surgical repair, including obesity, chronic cough, and heavy lifting or straining (including lifting children or adults, straining on the toilet, or lifting heavy objects such as furniture or anything weighing >25 lbs. Risks  of recurrence is 20-30% with vaginal native tissue repair and a less than 10% with  sacrocolpopexy with mesh.    Sacrocolpopexy: Mesh implants may provide more prolapse support, but do have some unique risks to consider. It is important to understand that mesh is permanent and cannot be easily removed. Risks of abdominal sacrocolpopexy mesh include mesh exposure (~3-6%), painful intercourse (recent studies show lower rates after surgery compared to before, with ~5-8% risk of new onset), and very rare risks of bowel or bladder injury or infection (<1%). The risk of mesh exposure is more likely in a woman with risks for poor healing (prior radiation, poorly controlled diabetes, or immunocompromised). The risk of new or worsened chronic pain after mesh implant is more common in women with baseline chronic pain and/or poorly controlled anxiety or depression. There is an FDA safety notification on vaginal mesh procedures for prolapse but NOT abdominal mesh procedures and therefore does not apply to your surgery. We have extensive experience and training with mesh placement and we have close postoperative follow up to identify any potential complications from mesh.    - For preop Visit:  She is required to have a visit within 30 days of her surgery.    - Medical clearance: not required  - Anticoagulant use: No - Medicaid Hysterectomy form: No - Accepts blood transfusion: Yes - Expected length of stay: outpatient  Request sent for surgery scheduling.   Marguerita Beards, MD

## 2023-01-07 ENCOUNTER — Encounter: Payer: Self-pay | Admitting: Obstetrics and Gynecology

## 2023-01-26 ENCOUNTER — Other Ambulatory Visit: Payer: Self-pay | Admitting: Adult Health

## 2023-01-26 DIAGNOSIS — Z9889 Other specified postprocedural states: Secondary | ICD-10-CM

## 2023-02-05 ENCOUNTER — Other Ambulatory Visit: Payer: Self-pay | Admitting: Obstetrics and Gynecology

## 2023-02-05 DIAGNOSIS — N952 Postmenopausal atrophic vaginitis: Secondary | ICD-10-CM

## 2023-02-17 ENCOUNTER — Other Ambulatory Visit: Payer: Self-pay | Admitting: Adult Health

## 2023-02-17 ENCOUNTER — Ambulatory Visit
Admission: RE | Admit: 2023-02-17 | Discharge: 2023-02-17 | Disposition: A | Discharge status: Home / Self Care | Payer: Medicare Other | Source: Ambulatory Visit | Attending: Adult Health | Admitting: Adult Health

## 2023-02-17 DIAGNOSIS — Z9889 Other specified postprocedural states: Secondary | ICD-10-CM

## 2023-03-05 ENCOUNTER — Telehealth: Payer: Self-pay

## 2023-03-05 NOTE — Telephone Encounter (Signed)
 Patient states she had her appointment with Dr. Teresa, his notes are in EPIC, He doesn't want to do any surgery at this time. Jacqueline Ortiz is wanting to proceed with the hysterectomy as previously discussed . She would like to get on the next available surgery date.

## 2023-03-26 NOTE — Progress Notes (Unsigned)
 [facility] Cancer Follow up:    Jacqueline Paradise, MD [facility] [place] [address]   DIAGNOSIS: Cancer Staging  Malignant neoplasm of overlapping sites of right breast in female, estrogen receptor positive (HCC) Staging form: Breast, AJCC 8th Edition - Clinical stage from 03/15/2021: Stage 0 (cTis (DCIS), cN0, cM0, G2, ER+, PR+, HER2: Not Assessed) - Signed by Malachy Mood, MD on 03/26/2021 Stage prefix: Initial diagnosis Histologic grading system: 3 grade system - Pathologic stage from 05/01/2021: Stage IA (pT1a, pN0, cM0, G2, ER+, PR+, HER2-) - Signed by Loa Socks, NP on 03/27/2022 Stage prefix: Initial diagnosis Histologic grading system: 3 grade system Residual tumor (R): R0   SUMMARY OF ONCOLOGIC HISTORY: Oncology History Overview Note   Cancer Staging  Ductal carcinoma in situ (DCIS) of right breast Staging form: Breast, AJCC 8th Edition - Clinical stage from 03/15/2021: Stage 0 (cTis (DCIS), cN0, cM0, G2, ER+, PR+, HER2: Not Assessed) - Signed by Malachy Mood, MD on 03/26/2021    Malignant neoplasm of overlapping sites of right breast in female, estrogen receptor positive (HCC)  03/05/2021 Mammogram   CLINICAL DATA:  Bilateral screening recall for a right breast distortion and possible masses and a possible left breast mass.   EXAM: DIGITAL DIAGNOSTIC BILATERAL MAMMOGRAM WITH TOMOSYNTHESIS AND CAD; ULTRASOUND LEFT BREAST LIMITED; ULTRASOUND RIGHT BREAST LIMITED  IMPRESSION: 1. There is a prominent distortion in the superior retroareolar right breast with a sonographic correlate at 12 o'clock. Cine images through this region demonstrates that the actual area of abnormality may be broader than the 1.5 cm ultrasound measurement.   2. There is an indeterminate 5 mm mass in the right breast at 3 o'clock.   3.  There is an indeterminate 5 mm mass in the right breast at 9:30.   4.  No evidence of bilateral axillary lymphadenopathy.   5.   The mass in the left breast corresponds with a benign cyst.   03/15/2021 Cancer Staging   Staging form: Breast, AJCC 8th Edition - Clinical stage from 03/15/2021: Stage 0 (cTis (DCIS), cN0, cM0, G2, ER+, PR+, HER2: Not Assessed) - Signed by Malachy Mood, MD on 03/26/2021 Stage prefix: Initial diagnosis Histologic grading system: 3 grade system   03/15/2021 Initial Biopsy   Diagnosis 1. Breast, right, needle core biopsy, 12 o'clock, 1cmfn, ribbon -DUCTAL CARCINOMA IN SITU, INTERMEDIATE NUCLEAR GRADE, SOLID TYPE WITHOUT NECROSIS -NEGATIVE FOR INVASIVE CARCINOMA -COMPLEX SCLEROSING LESION WITH MICROCALCIFICATIONS -PROLIFERATIVE FIBROCYSTIC CHANGES INCLUDING EPITHELIAL HYPERPLASIA WITHOUT ATYPIA 2. Breast, right, needle core biopsy, 3 o'clock, 5cmfn, coil -RADIAL SCAR/COMPLEX SCLEROSING LESION -NEGATIVE FOR MICROCALCIFICATIONS -NEGATIVE FOR CARCINOMA 3. Breast, right, needle core biopsy, 9:30 o'clock, 5cmfn, heart -HIGH-GRADE DUCTAL CARCINOMA IN SITU, SOLID TYPE WITHOUT NECROSIS INVOLVING A FIBROADENOMA -NEGATIVE FOR INVASIVE CARCINOMA -NEGATIVE FOR CALCIFICATIONS  1. PROGNOSTIC INDICATORS Results: Estrogen Receptor: 90%, POSITIVE, STRONG STAINING INTENSITY Progesterone Receptor: 40%, POSITIVE, STRONG STAINING INTENSITY  3. PROGNOSTIC INDICATORS Results: Estrogen Receptor: 100%, POSITIVE, STRONG STAINING INTENSITY Progesterone Receptor: 10%, POSITIVE, STRONG STAINING INTENSITY   03/21/2021 Initial Diagnosis   Ductal carcinoma in situ (DCIS) of right breast    Genetic Testing   [facility] CancerNext Panel was Negative. Report date is 04/08/2021.  The CancerNext gene panel offered by [employer] includes sequencing, rearrangement analysis, and RNA analysis for the following 36 genes:   APC, ATM, AXIN2, BARD1, BMPR1A, BRCA1, BRCA2, BRIP1, CDH1, CDK4, CDKN2A, CHEK2, DICER1, HOXB13, EPCAM, GREM1, MLH1, MSH2, MSH3, MSH6, MUTYH, NBN, NF1, NTHL1, PALB2, PMS2, POLD1, POLE, PTEN, RAD51C,  RAD51D,  RECQL, SMAD4, SMARCA4, STK11, and TP53.    04/18/2021 Pathology Results   Diagnosis Breast, right, needle core biopsy, posterior central FOCAL INVASIVE MODERATELY DIFFERENTIATED DUCTAL CARCINOMA, GRADE 2 (3+2+1) EXTENSIVE HIGH-GRADE DUCTAL CARCINOMA IN SITU, SOLID TYPE WITH NECROSIS INVASIVE TUMOR MEASURES 1.5 MM COMPLEX SCLEROSING LESION AND FIBROCYSTIC CHANGES INCLUDING EPITHELIAL HYPERPLASIA WITHOUT ATYPIA  PROGNOSTIC INDICATORS Results: The tumor cells are EQUIVOCAL for Her2 (2+). Her2 by FISH will be performed and the results reported separately. Estrogen Receptor: 100%, POSITIVE, STRONG STAINING INTENSITY Progesterone Receptor: 90%, POSITIVE, MODERATE STAINING INTENSITY Proliferation Marker Ki67: 10%  FLUORESCENCE IN-SITU HYBRIDIZATION Results: GROUP 4: HER2 **NEGATIVE**   05/01/2021 Cancer Staging   Staging form: Breast, AJCC 8th Edition - Pathologic stage from 05/01/2021: Stage IA (pT1a, pN0, cM0, G2, ER+, PR+, HER2-) - Signed by Loa Socks, NP on 03/27/2022 Stage prefix: Initial diagnosis Histologic grading system: 3 grade system Residual tumor (R): R0 - None   05/01/2021 Definitive Surgery   FINAL MICROSCOPIC DIAGNOSIS:   A. BREAST, RIGHT MEDIAL, LUMPECTOMY:  A small focus of residual DCIS in the vicinity of healed biopsy site  with clear margins of resection.   B. BREAST, RIGHT LATERAL, LUMPECTOMY:  Residual foci of intermediate grade DCIS with solid growth pattern  adjacent to organized blood clots with clear margins of resection.  Invasive carcinoma is not identified.  Please see the synoptic report for specimen E.   C. LYMPH NODE, RIGHT AXILLARY, SENTINEL, EXCISION:  A lymph node with fatty infiltration which is negative for metastatic  carcinoma.   D. LYMPH NODE, RIGHT AXILLARY, SENTINEL, EXCISION:  A lymph node with fatty infiltration which is negative for metastatic  carcinoma.   E. LYMPH NODE, RIGHT AXILLARY, SENTINEL, EXCISION:  A lymph node  negative for metastatic carcinoma.    05/28/2021 - 06/26/2021 Radiation Therapy   Site Technique Total Dose (Gy) Dose per Fx (Gy) Completed Fx Beam Energies  Breast, Right: Breast_R 3D 42.56/42.56 2.66 16/16 6XFFF  Breast, Right: Breast_R_Bst 3D 8/8 2 4/4 6X, 10X       CURRENT THERAPY: observation  INTERVAL HISTORY:  Discussed the use of AI scribe software for clinical note transcription with the patient, who gave verbal consent to proceed.  Jacqueline Ortiz 78 y.o. female returns for    Patient Active Problem List   Diagnosis Date Noted   HTN (hypertension) 05/25/2022   HLD (hyperlipidemia) 05/25/2022   CAP (community acquired pneumonia) 05/25/2022   Genetic testing 04/12/2021   Family history of melanoma 03/28/2021   Family history of colon cancer 03/28/2021   Malignant neoplasm of overlapping sites of right breast in female, estrogen receptor positive (HCC) 03/05/2021   S/P Nissen fundoplication (with gastrostomy tube placement) (HCC) 02/06/2016   GERD with stricture 06/26/2011   History of colonic polyps 06/26/2011    has no known allergies.  MEDICAL HISTORY: Past Medical History:  Diagnosis Date   Allergic rhinosinusitis 2008   usual daily sinus drainage and post nasal drip   Arthritis 2023   osteoarthritis   Breast cancer [facility])    Diverticulosis    patient unsure of date of diagnosis   Dry eyes, bilateral 2000   Duodenal diverticulum    patient unsure of diagnosis date   Dyslipidemia 2008   Eczema 1993   Esophageal stricture 2008   Hemorrhoids 2003   internal and external- remains- treats as needed with topical gels   History of hiatal hernia    Hyperlipidemia 2008   Hypertension 2021  Nephrolithiasis 2017   Osteoporosis 2003   Panic disorder    patient reports anxiety most of her life   Paralyzed vocal cords 1993   30 years ago/ per pt unsure of cause" speaks in soft whispery tone is normal"   Scoliosis deformity of spine    very severe"can't lay flat  with out cushion"- started as a young teenager   Tubular adenoma of colon 09/06/2004   Dr. Stan Head    SURGICAL HISTORY: Past Surgical History:  Procedure Laterality Date   BREAST BIOPSY Right 03/15/2021   BREAST BIOPSY Right 04/18/2021   BREAST LUMPECTOMY WITH RADIOACTIVE SEED LOCALIZATION Right 05/01/2021   Procedure: RIGHT BREAST LUMPECTOMY WITH RADIOACTIVE SEED LOCALIZATION X4;  Surgeon: Harriette Bouillon, MD;  Location: [facility] OR;  Service: General;  Laterality: Right;   CESAREAN SECTION     COLONOSCOPY  2022   Dr. Stan Head   DILATION AND CURETTAGE OF UTERUS     ESOPHAGOGASTRODUODENOSCOPY  02/02/2003   Dr. Stan Head   ESOPHAGOGASTRODUODENOSCOPY N/A 10/21/2015   Procedure: ESOPHAGOGASTRODUODENOSCOPY (EGD);  Surgeon: Rachael Fee, MD;  Location: [facility];  Service: Endoscopy;  Laterality: N/A;   HIATAL HERNIA REPAIR     KNEE SURGERY Right    right fx- retained hardware   NISSEN FUNDOPLICATION  2018   SENTINEL NODE BIOPSY Right 05/01/2021   Procedure: SENTINEL NODE BIOPSY;  Surgeon: Harriette Bouillon, MD;  Location: [facility] OR;  Service: General;  Laterality: Right;   TONSILLECTOMY  1953   age 58   TUBAL LIGATION     UPPER GASTROINTESTINAL ENDOSCOPY     vocal cord surgery     "did improve condition"    SOCIAL HISTORY: Social History   Socioeconomic History   Marital status: Married    Spouse name: Not on file   Number of children: 1   Years of education: Not on file   Highest education level: Not on file  Occupational History   Not on file  Tobacco Use   Smoking status: Never   Smokeless tobacco: Never  Vaping Use   Vaping status: Never Used  Substance and Sexual Activity   Alcohol use: No   Drug use: No   Sexual activity: Yes    Birth control/protection: None    Comment: Post menopause  Other Topics Concern   Not on file  Social History Narrative   Not on file   Social Drivers of Health   Financial Resource Strain: Not on file  Food Insecurity: Not  on file  Transportation Needs: Not on file  Physical Activity: Not on file  Stress: Not on file  Social Connections: Not on file  Intimate Partner Violence: Not on file    FAMILY HISTORY: Family History  Problem Relation Age of Onset   Heart disease Mother    Diabetes Mother    Melanoma Father    Heart disease Father    Arthritis Father    Heart disease Sister    Skin cancer Brother        patient unsure of what type of skin cancer brother had   Colon polyps Brother    Colon cancer Maternal Grandmother 31   Cancer Maternal Aunt 65       sinus cancer   Esophageal cancer Neg Hx    Rectal cancer Neg Hx    Stomach cancer Neg Hx    Breast cancer Neg Hx     Review of Systems  Constitutional:  Negative for appetite change, chills, fatigue,  fever and unexpected weight change.  HENT:   Negative for hearing loss, lump/mass and trouble swallowing.   Eyes:  Negative for eye problems and icterus.  Respiratory:  Negative for chest tightness, cough and shortness of breath.   Cardiovascular:  Negative for chest pain, leg swelling and palpitations.  Gastrointestinal:  Negative for abdominal distention, abdominal pain, constipation, diarrhea, nausea and vomiting.  Endocrine: Negative for hot flashes.  Genitourinary:  Negative for difficulty urinating.   Musculoskeletal:  Negative for arthralgias.  Skin:  Negative for itching and rash.  Neurological:  Negative for dizziness, extremity weakness, headaches and numbness.  Hematological:  Negative for adenopathy. Does not bruise/bleed easily.  Psychiatric/Behavioral:  Negative for depression. The patient is not nervous/anxious.       PHYSICAL EXAMINATION    There were no vitals filed for this visit.  Physical Exam Constitutional:      General: She is not in acute distress.    Appearance: Normal appearance. She is not toxic-appearing.  HENT:     Head: Normocephalic and atraumatic.     Mouth/Throat:     Mouth: Mucous membranes are  moist.     Pharynx: Oropharynx is clear. No oropharyngeal exudate or posterior oropharyngeal erythema.  Eyes:     General: No scleral icterus. Cardiovascular:     Rate and Rhythm: Normal rate and regular rhythm.     Pulses: Normal pulses.     Heart sounds: Normal heart sounds.  Pulmonary:     Effort: Pulmonary effort is normal.     Breath sounds: Normal breath sounds.  Abdominal:     General: Abdomen is flat. Bowel sounds are normal. There is no distension.     Palpations: Abdomen is soft.     Tenderness: There is no abdominal tenderness.  Musculoskeletal:        General: No swelling.     Cervical back: Neck supple.  Lymphadenopathy:     Cervical: No cervical adenopathy.  Skin:    General: Skin is warm and dry.     Findings: No rash.  Neurological:     General: No focal deficit present.     Mental Status: She is alert.  Psychiatric:        Mood and Affect: Mood normal.        Behavior: Behavior normal.        ASSESSMENT and THERAPY PLAN:   No problem-specific Assessment & Plan notes found for this encounter.   No orders of the defined types were placed in this encounter.   All questions were answered. The patient knows to call the clinic with any problems, questions or concerns. We can certainly see the patient much sooner if necessary. This note was electronically signed. Noreene Filbert, NP 03/26/2023

## 2023-03-27 ENCOUNTER — Encounter: Payer: Self-pay | Admitting: Adult Health

## 2023-03-27 ENCOUNTER — Inpatient Hospital Stay: Payer: Medicare Other | Attending: Adult Health | Admitting: Adult Health

## 2023-03-27 ENCOUNTER — Inpatient Hospital Stay: Payer: Medicare Other | Admitting: Adult Health

## 2023-03-27 ENCOUNTER — Telehealth: Payer: Self-pay | Admitting: Adult Health

## 2023-03-27 VITALS — BP 130/59 | HR 71 | Temp 97.5°F | Resp 18 | Ht 61.5 in | Wt 126.1 lb

## 2023-03-27 DIAGNOSIS — C50811 Malignant neoplasm of overlapping sites of right female breast: Secondary | ICD-10-CM

## 2023-03-27 DIAGNOSIS — Z853 Personal history of malignant neoplasm of breast: Secondary | ICD-10-CM | POA: Diagnosis present

## 2023-03-27 DIAGNOSIS — N8189 Other female genital prolapse: Secondary | ICD-10-CM | POA: Insufficient documentation

## 2023-03-27 DIAGNOSIS — Z808 Family history of malignant neoplasm of other organs or systems: Secondary | ICD-10-CM | POA: Diagnosis not present

## 2023-03-27 DIAGNOSIS — Z170 Estrogen receptor positive status [ER+]: Secondary | ICD-10-CM | POA: Diagnosis not present

## 2023-03-27 DIAGNOSIS — Z800 Family history of malignant neoplasm of digestive organs: Secondary | ICD-10-CM | POA: Insufficient documentation

## 2023-03-27 NOTE — Assessment & Plan Note (Signed)
 Jacqueline Ortiz is a 78 year old woman with stage Ia ER/PR positive right-sided breast cancer status post lumpectomy followed by adjuvant radiation and opted to forego antiestrogen therapy.  Breast Cancer Follow-up No new breast symptoms. Physical exam unremarkable. No clinical or radiographic signs of recurrence. -Continue annual follow-up visits. -Contact office with any new breast changes or concerns. -Continue regular mammograms as recommended by primary care provider.  Pelvic Organ Prolapse Patient reports discomfort and limitation in physical activity due to prolapse. Hysterectomy planned with another provider. No need for rectal surgery as initially suspected. -Continue with planned hysterectomy. -Resume physical activity post-surgery as tolerated.  RTC in 1 year for continued long-term surveillance

## 2023-03-27 NOTE — Telephone Encounter (Signed)
 Scheduled appointment per 2/28 los. Patient is aware of the made appointment.

## 2023-04-06 ENCOUNTER — Ambulatory Visit (INDEPENDENT_AMBULATORY_CARE_PROVIDER_SITE_OTHER): Admitting: Obstetrics and Gynecology

## 2023-04-06 ENCOUNTER — Encounter: Payer: Self-pay | Admitting: Obstetrics and Gynecology

## 2023-04-06 ENCOUNTER — Other Ambulatory Visit: Payer: Self-pay | Admitting: Obstetrics and Gynecology

## 2023-04-06 VITALS — BP 125/76 | HR 86 | Wt 124.8 lb

## 2023-04-06 DIAGNOSIS — N816 Rectocele: Secondary | ICD-10-CM

## 2023-04-06 DIAGNOSIS — N8110 Cystocele, unspecified: Secondary | ICD-10-CM

## 2023-04-06 DIAGNOSIS — Z01818 Encounter for other preprocedural examination: Secondary | ICD-10-CM

## 2023-04-06 DIAGNOSIS — N813 Complete uterovaginal prolapse: Secondary | ICD-10-CM

## 2023-04-06 DIAGNOSIS — K623 Rectal prolapse: Secondary | ICD-10-CM

## 2023-04-06 MED ORDER — POLYETHYLENE GLYCOL 3350 17 GM/SCOOP PO POWD: 17.0000 g | Freq: Every day | ORAL | 0 refills | Status: DC

## 2023-04-06 MED ORDER — TRAMADOL HCL 50 MG PO TABS: 50.0000 mg | ORAL_TABLET | ORAL | 0 refills | Status: AC | PRN

## 2023-04-06 MED ORDER — IBUPROFEN 600 MG PO TABS: 600.0000 mg | ORAL_TABLET | Freq: Four times a day (QID) | ORAL | 0 refills | Status: DC | PRN

## 2023-04-06 MED ORDER — ACETAMINOPHEN 500 MG PO TABS: 500.0000 mg | ORAL_TABLET | Freq: Four times a day (QID) | ORAL | 0 refills | Status: DC | PRN

## 2023-04-06 NOTE — H&P (Signed)
  Urogynecology H&P  Subjective Chief Complaint: Jacqueline Ortiz presents for a preoperative encounter.   History of Present Illness: Jacqueline Ortiz is a 78 y.o. female who presents for preoperative visit.  She is scheduled to undergo Exam under anesthesia, robotic assisted total laparoscopic hysterectomy with bilateral salpingo-oophorectomy, sacrocolpopexy, perineorrhaphy, cystoscopy, possible posterior repair  on 04/15/23.  Her symptoms include pelvic organ prolapse, and she was was found to have Stage IV anterior, Stage IV posterior, Stage IV apical prolapse.   Urodynamics showed: 1. Sensation was increased; capacity was reduced 2. Stress Incontinence was demonstrated at normal pressures; 3. Detrusor Overactivity was demonstrated with leakage. 4. Emptying was dysfunctional with a elevated PVR (275), a sustained detrusor contraction present,  abdominal straining not present, dyssynergic urethral sphincter activity on EMG.  Past Medical History:  Diagnosis Date   Allergic rhinosinusitis 2008   usual daily sinus drainage and post nasal drip   Arthritis 2023   osteoarthritis   Breast cancer (HCC)    Diverticulosis    patient unsure of date of diagnosis   Dry eyes, bilateral 2000   Duodenal diverticulum    patient unsure of diagnosis date   Dyslipidemia 2008   Eczema 1993   Esophageal stricture 2008   Hemorrhoids 2003   internal and external- remains- treats as needed with topical gels   History of hiatal hernia    Hyperlipidemia 2008   Hypertension 2021   Nephrolithiasis 2017   Osteoporosis 2003   Panic disorder    patient reports anxiety most of her life   Paralyzed vocal cords 1993   30 years ago/ per pt unsure of cause" speaks in soft whispery tone is normal"   Scoliosis deformity of spine    very severe"can't lay flat with out cushion"- started as a young teenager   Tubular adenoma of colon 09/06/2004   Dr. Stan Head     Past Surgical History:  Procedure  Laterality Date   BREAST BIOPSY Right 03/15/2021   BREAST BIOPSY Right 04/18/2021   BREAST LUMPECTOMY WITH RADIOACTIVE SEED LOCALIZATION Right 05/01/2021   Procedure: RIGHT BREAST LUMPECTOMY WITH RADIOACTIVE SEED LOCALIZATION X4;  Surgeon: Harriette Bouillon, MD;  Location: [facility] OR;  Service: General;  Laterality: Right;   CESAREAN SECTION     COLONOSCOPY  2022   Dr. Stan Head   DILATION AND CURETTAGE OF UTERUS     ESOPHAGOGASTRODUODENOSCOPY  02/02/2003   Dr. Stan Head   ESOPHAGOGASTRODUODENOSCOPY N/A 10/21/2015   Procedure: ESOPHAGOGASTRODUODENOSCOPY (EGD);  Surgeon: Rachael Fee, MD;  Location: [facility];  Service: Endoscopy;  Laterality: N/A;   HIATAL HERNIA REPAIR     KNEE SURGERY Right    right fx- retained hardware   NISSEN FUNDOPLICATION  2018   SENTINEL NODE BIOPSY Right 05/01/2021   Procedure: SENTINEL NODE BIOPSY;  Surgeon: Harriette Bouillon, MD;  Location: [facility] OR;  Service: General;  Laterality: Right;   TONSILLECTOMY  1953   age 48   TUBAL LIGATION     UPPER GASTROINTESTINAL ENDOSCOPY     vocal cord surgery     "did improve condition"    has no known allergies.   Family History  Problem Relation Age of Onset   Heart disease Mother    Diabetes Mother    Melanoma Father    Heart disease Father    Arthritis Father    Heart disease Sister    Skin cancer Brother        patient unsure of what type of skin  cancer brother had   Colon polyps Brother    Colon cancer Maternal Grandmother 36   Cancer Maternal Aunt 65       sinus cancer   Esophageal cancer Neg Hx    Rectal cancer Neg Hx    Stomach cancer Neg Hx    Breast cancer Neg Hx     Social History   Tobacco Use   Smoking status: Never   Smokeless tobacco: Never  Vaping Use   Vaping status: Never Used  Substance Use Topics   Alcohol use: No   Drug use: No     Review of Systems was negative for a full 10 system review except as noted in the History of Present Illness.  No current facility-administered  medications for this encounter.  Current Outpatient Medications:    acetaminophen (TYLENOL) 500 MG tablet, Take 1 tablet (500 mg total) by mouth every 6 (six) hours as needed (pain)., Disp: 30 tablet, Rfl: 0   atorvastatin (LIPITOR) 20 MG tablet, Take 20 mg by mouth in the morning., Disp: , Rfl:    Calcium-Magnesium-Vitamin D 500-50-100 MG-MG-UNIT CHEW, Chew 2 capsules by mouth 2 (two) times daily., Disp: , Rfl:    escitalopram (LEXAPRO) 10 MG tablet, 1 tablet Orally Once a day for 90 days, Disp: , Rfl:    estradiol (ESTRACE) 0.1 MG/GM vaginal cream, PLACE 0.5 GRAMS VAGINALLY AT BEDTIME FOR 14 DAYS THEN USE TWICE WEEKLY, Disp: 42.5 g, Rfl: 0   ibuprofen (ADVIL) 600 MG tablet, Take 1 tablet (600 mg total) by mouth every 6 (six) hours as needed., Disp: 30 tablet, Rfl: 0   Ketotifen Fumarate (REFRESH EYE ITCH RELIEF OP), Place 1 drop into both eyes 4 (four) times daily as needed (eye irritation.)., Disp: , Rfl:    loratadine (CLARITIN) 10 MG tablet, Take 10 mg by mouth in the morning., Disp: , Rfl:    phenylephrine (SUDAFED PE) 10 MG TABS tablet, Take 10 mg by mouth every 4 (four) hours as needed., Disp: , Rfl:    Phenylephrine-Witch Hazel (PREPARATION H) 0.25-50 % GEL, Apply 1 application  topically daily as needed (irritation/discomfort)., Disp: , Rfl:    polyethylene glycol powder (GLYCOLAX/MIRALAX) 17 GM/SCOOP powder, Take 17 g by mouth daily. Drink 17g (1 scoop) dissolved in water per day., Disp: 255 g, Rfl: 0   traMADol (ULTRAM) 50 MG tablet, Take 1 tablet (50 mg total) by mouth every 4 (four) hours as needed for up to 5 days for severe pain (pain score 7-10)., Disp: 15 tablet, Rfl: 0   triamterene-hydrochlorothiazide (MAXZIDE-25) 37.5-25 MG tablet, Take 1 tablet by mouth daily., Disp: , Rfl:    Objective There were no vitals filed for this visit.   Gen: NAD CV: S1 S2 RRR Lungs: Clear to auscultation bilaterally Abd: soft, nontender   Previous Pelvic Exam showed: POP-Q   3                                             Aa   6                                           Ba   6  C    6                                            Gh   4                                            Pb   8                                            tvl    3                                            Ap   6                                            Bp   -3                                           Assessment/ Plan  Assessment: The patient is a 78 y.o. year old scheduled to undergo Exam under anesthesia, robotic assisted total laparoscopic hysterectomy with bilateral salpingo-oophorectomy, sacrocolpopexy, perineorrhaphy, cystoscopy, possible posterior repair. Verbal consent was obtained for these procedures.

## 2023-04-06 NOTE — Progress Notes (Signed)
 [facility] Pre-Operative Exam  Subjective Chief Complaint: Jacqueline Ortiz presents for a preoperative encounter.   History of Present Illness: Jacqueline Ortiz is a 78 y.o. female who presents for preoperative visit.  She is scheduled to undergo Exam under anesthesia, robotic assisted total laparoscopic hysterectomy with bilateral salpingo-oophorectomy, sacrocolpopexy, perineorrhaphy, cystoscopy, possible posterior repair  on 04/15/23.  Her symptoms include pelvic organ prolapse, and she was was found to have Stage IV anterior, Stage IV posterior, Stage IV apical prolapse.   Urodynamics showed: 1. Sensation was increased; capacity was reduced 2. Stress Incontinence was demonstrated at normal pressures; 3. Detrusor Overactivity was demonstrated with leakage. 4. Emptying was dysfunctional with a elevated PVR (275), a sustained detrusor contraction present,  abdominal straining not present, dyssynergic urethral sphincter activity on EMG.  Past Medical History:  Diagnosis Date   Allergic rhinosinusitis 2008   usual daily sinus drainage and post nasal drip   Arthritis 2023   osteoarthritis   Breast cancer (HCC)    Diverticulosis    patient unsure of date of diagnosis   Dry eyes, bilateral 2000   Duodenal diverticulum    patient unsure of diagnosis date   Dyslipidemia 2008   Eczema 1993   Esophageal stricture 2008   Hemorrhoids 2003   internal and external- remains- treats as needed with topical gels   History of hiatal hernia    Hyperlipidemia 2008   Hypertension 2021   Nephrolithiasis 2017   Osteoporosis 2003   Panic disorder    patient reports anxiety most of her life   Paralyzed vocal cords 1993   30 years ago/ per pt unsure of cause" speaks in soft whispery tone is normal"   Scoliosis deformity of spine    very severe"can't lay flat with out cushion"- started as a young teenager   Tubular adenoma of colon 09/06/2004   Dr. Stan Head     Past Surgical  History:  Procedure Laterality Date   BREAST BIOPSY Right 03/15/2021   BREAST BIOPSY Right 04/18/2021   BREAST LUMPECTOMY WITH RADIOACTIVE SEED LOCALIZATION Right 05/01/2021   Procedure: RIGHT BREAST LUMPECTOMY WITH RADIOACTIVE SEED LOCALIZATION X4;  Surgeon: Harriette Bouillon, MD;  Location: [facility] OR;  Service: General;  Laterality: Right;   CESAREAN SECTION     COLONOSCOPY  2022   Dr. Stan Head   DILATION AND CURETTAGE OF UTERUS     ESOPHAGOGASTRODUODENOSCOPY  02/02/2003   Dr. Stan Head   ESOPHAGOGASTRODUODENOSCOPY N/A 10/21/2015   Procedure: ESOPHAGOGASTRODUODENOSCOPY (EGD);  Surgeon: Rachael Fee, MD;  Location: [facility];  Service: Endoscopy;  Laterality: N/A;   HIATAL HERNIA REPAIR     KNEE SURGERY Right    right fx- retained hardware   NISSEN FUNDOPLICATION  2018   SENTINEL NODE BIOPSY Right 05/01/2021   Procedure: SENTINEL NODE BIOPSY;  Surgeon: Harriette Bouillon, MD;  Location: [facility] OR;  Service: General;  Laterality: Right;   TONSILLECTOMY  1953   age 51   TUBAL LIGATION     UPPER GASTROINTESTINAL ENDOSCOPY     vocal cord surgery     "did improve condition"    has no known allergies.   Family History  Problem Relation Age of Onset   Heart disease Mother    Diabetes Mother    Melanoma Father    Heart disease Father    Arthritis Father    Heart disease Sister    Skin cancer Brother        patient unsure of what type of  skin cancer brother had   Colon polyps Brother    Colon cancer Maternal Grandmother 18   Cancer Maternal Aunt 65       sinus cancer   Esophageal cancer Neg Hx    Rectal cancer Neg Hx    Stomach cancer Neg Hx    Breast cancer Neg Hx     Social History   Tobacco Use   Smoking status: Never   Smokeless tobacco: Never  Vaping Use   Vaping status: Never Used  Substance Use Topics   Alcohol use: No   Drug use: No     Review of Systems was negative for a full 10 system review except as noted in the History of Present Illness.   Current  Outpatient Medications:    acetaminophen (TYLENOL) 500 MG tablet, Take 1 tablet (500 mg total) by mouth every 6 (six) hours as needed (pain)., Disp: 30 tablet, Rfl: 0   atorvastatin (LIPITOR) 20 MG tablet, Take 20 mg by mouth in the morning., Disp: , Rfl:    Calcium-Magnesium-Vitamin D 500-50-100 MG-MG-UNIT CHEW, Chew 2 capsules by mouth 2 (two) times daily., Disp: , Rfl:    escitalopram (LEXAPRO) 10 MG tablet, 1 tablet Orally Once a day for 90 days, Disp: , Rfl:    estradiol (ESTRACE) 0.1 MG/GM vaginal cream, PLACE 0.5 GRAMS VAGINALLY AT BEDTIME FOR 14 DAYS THEN USE TWICE WEEKLY, Disp: 42.5 g, Rfl: 0   ibuprofen (ADVIL) 600 MG tablet, Take 1 tablet (600 mg total) by mouth every 6 (six) hours as needed., Disp: 30 tablet, Rfl: 0   Ketotifen Fumarate (REFRESH EYE ITCH RELIEF OP), Place 1 drop into both eyes 4 (four) times daily as needed (eye irritation.)., Disp: , Rfl:    loratadine (CLARITIN) 10 MG tablet, Take 10 mg by mouth in the morning., Disp: , Rfl:    phenylephrine (SUDAFED PE) 10 MG TABS tablet, Take 10 mg by mouth every 4 (four) hours as needed., Disp: , Rfl:    Phenylephrine-Witch Hazel (PREPARATION H) 0.25-50 % GEL, Apply 1 application  topically daily as needed (irritation/discomfort)., Disp: , Rfl:    polyethylene glycol powder (GLYCOLAX/MIRALAX) 17 GM/SCOOP powder, Take 17 g by mouth daily. Drink 17g (1 scoop) dissolved in water per day., Disp: 255 g, Rfl: 0   traMADol (ULTRAM) 50 MG tablet, Take 1 tablet (50 mg total) by mouth every 4 (four) hours as needed for up to 5 days for severe pain (pain score 7-10)., Disp: 15 tablet, Rfl: 0   triamterene-hydrochlorothiazide (MAXZIDE-25) 37.5-25 MG tablet, Take 1 tablet by mouth daily., Disp: , Rfl:    mirabegron ER (MYRBETRIQ) 50 MG TB24 tablet, TAKE 1 TABLET BY MOUTH EVERY DAY, Disp: 30 tablet, Rfl: 5   Objective Vitals:   04/06/23 1023  BP: 125/76  Pulse: 86    Gen: NAD CV: S1 S2 RRR Lungs: Clear to auscultation bilaterally Abd:  soft, nontender   Previous Pelvic Exam showed: POP-Q   3                                            Aa   6                                           Ba  6                                              C    6                                            Gh   4                                            Pb   8                                            tvl    3                                            Ap   6                                            Bp   -3                                           Assessment/ Plan  Assessment: The patient is a 78 y.o. year old scheduled to undergo Exam under anesthesia, robotic assisted total laparoscopic hysterectomy with bilateral salpingo-oophorectomy, sacrocolpopexy, perineorrhaphy, cystoscopy, possible posterior repair. Verbal consent was obtained for these procedures.  Plan: General Surgical Consent: The patient has previously been counseled on alternative treatments, and the decision by the patient and provider was to proceed with the procedure listed above.  For all procedures, there are risks of bleeding, infection, damage to surrounding organs including but not limited to bowel, bladder, blood vessels, ureters and nerves, and need for further surgery if an injury were to occur. These risks are all low with minimally invasive surgery.   There are risks of numbness and weakness at any body site or buttock/rectal pain.  It is possible that baseline pain can be worsened by surgery, either with or without mesh. If surgery is vaginal, there is also a low risk of possible conversion to laparoscopy or open abdominal incision where indicated. Very rare risks include blood transfusion, blood clot, heart attack, pneumonia, or death.   There is also a risk of short-term postoperative urinary retention with need to use a catheter. About half of patients need to go home from surgery with a catheter, which is then later removed in the office.  The risk of long-term need for a catheter is very low. There is also a risk of worsening of overactive bladder.    Prolapse (with or without mesh): Risk factors for surgical failure  include things that put pressure on your pelvis and the surgical repair, including obesity,  chronic cough, and heavy lifting or straining (including lifting children or adults, straining on the toilet, or lifting heavy objects such as furniture or anything weighing >25 lbs. Risks of recurrence is 20-30% with vaginal native tissue repair and a less than 10% with sacrocolpopexy with mesh.    Sacrocolpopexy: Mesh implants may provide more prolapse support, but do have some unique risks to consider. It is important to understand that mesh is permanent and cannot be easily removed. Risks of abdominal sacrocolpopexy mesh include mesh exposure (~3-6%), painful intercourse (recent studies show lower rates after surgery compared to before, with ~5-8% risk of new onset), and very rare risks of bowel or bladder injury or infection (<1%). The risk of mesh exposure is more likely in a woman with risks for poor healing (prior radiation, poorly controlled diabetes, or immunocompromised). The risk of new or worsened chronic pain after mesh implant is more common in women with baseline chronic pain and/or poorly controlled anxiety or depression. There is an FDA safety notification on vaginal mesh procedures for prolapse but NOT abdominal mesh procedures and therefore does not apply to your surgery. We have extensive experience and training with mesh placement and we have close postoperative follow up to identify any potential complications from mesh.    We discussed consent for blood products. Risks for blood transfusion include allergic reactions, other reactions that can affect different body organs and managed accordingly, transmission of infectious diseases such as HIV or Hepatitis. However, the blood is screened. Patient consents for blood  products.  Pre-operative instructions:  She was instructed to not take Aspirin/NSAIDs x 7days prior to surgery.  Antibiotic prophylaxis was ordered as indicated.  Catheter use: Patient will go home with foley if needed after post-operative voiding trial.  Post-operative instructions:  She was provided with specific post-operative instructions, including precautions and signs/symptoms for which we would recommend contacting us, in addition to daytime and after-hours contact phone numbers. This was provided on a handout.   Post-operative medications: Prescriptions for motrin, tylenol, miralax, and Tramadol were sent to her pharmacy. Discussed using ibuprofen and tylenol on a schedule to limit use of narcotics.   Laboratory testing:  We will check labs: CBC and Type and Screen  Preoperative clearance:  She does not require surgical clearance.    Post-operative follow-up:  A post-operative appointment will be made for 6 weeks from the date of surgery. If she needs a post-operative nurse visit for a voiding trial, that will be set up after she leaves the hospital.    Patient will call the clinic or use MyChart should anything change or any new issues arise.   Selmer Dominion, NP

## 2023-04-06 NOTE — H&P (View-Only) (Signed)
 [facility] Pre-Operative Exam  Subjective Chief Complaint: Jacqueline Ortiz presents for a preoperative encounter.   History of Present Illness: Jacqueline Ortiz is a 78 y.o. female who presents for preoperative visit.  She is scheduled to undergo Exam under anesthesia, robotic assisted total laparoscopic hysterectomy with bilateral salpingo-oophorectomy, sacrocolpopexy, perineorrhaphy, cystoscopy, possible posterior repair  on 04/15/23.  Her symptoms include pelvic organ prolapse, and she was was found to have Stage IV anterior, Stage IV posterior, Stage IV apical prolapse.   Urodynamics showed: 1. Sensation was increased; capacity was reduced 2. Stress Incontinence was demonstrated at normal pressures; 3. Detrusor Overactivity was demonstrated with leakage. 4. Emptying was dysfunctional with a elevated PVR (275), a sustained detrusor contraction present,  abdominal straining not present, dyssynergic urethral sphincter activity on EMG.  Past Medical History:  Diagnosis Date   Allergic rhinosinusitis 2008   usual daily sinus drainage and post nasal drip   Arthritis 2023   osteoarthritis   Breast cancer (HCC)    Diverticulosis    patient unsure of date of diagnosis   Dry eyes, bilateral 2000   Duodenal diverticulum    patient unsure of diagnosis date   Dyslipidemia 2008   Eczema 1993   Esophageal stricture 2008   Hemorrhoids 2003   internal and external- remains- treats as needed with topical gels   History of hiatal hernia    Hyperlipidemia 2008   Hypertension 2021   Nephrolithiasis 2017   Osteoporosis 2003   Panic disorder    patient reports anxiety most of her life   Paralyzed vocal cords 1993   30 years ago/ per pt unsure of cause" speaks in soft whispery tone is normal"   Scoliosis deformity of spine    very severe"can't lay flat with out cushion"- started as a young teenager   Tubular adenoma of colon 09/06/2004   Dr. Stan Head     Past Surgical  History:  Procedure Laterality Date   BREAST BIOPSY Right 03/15/2021   BREAST BIOPSY Right 04/18/2021   BREAST LUMPECTOMY WITH RADIOACTIVE SEED LOCALIZATION Right 05/01/2021   Procedure: RIGHT BREAST LUMPECTOMY WITH RADIOACTIVE SEED LOCALIZATION X4;  Surgeon: Harriette Bouillon, MD;  Location: [facility];  Service: General;  Laterality: Right;   CESAREAN SECTION     COLONOSCOPY  2022   Dr. Stan Head   DILATION AND CURETTAGE OF UTERUS     ESOPHAGOGASTRODUODENOSCOPY  02/02/2003   Dr. Stan Head   ESOPHAGOGASTRODUODENOSCOPY N/A 10/21/2015   Procedure: ESOPHAGOGASTRODUODENOSCOPY (EGD);  Surgeon: Rachael Fee, MD;  Location: [facility];  Service: Endoscopy;  Laterality: N/A;   HIATAL HERNIA REPAIR     KNEE SURGERY Right    right fx- retained hardware   NISSEN FUNDOPLICATION  2018   SENTINEL NODE BIOPSY Right 05/01/2021   Procedure: SENTINEL NODE BIOPSY;  Surgeon: Harriette Bouillon, MD;  Location: [facility];  Service: General;  Laterality: Right;   TONSILLECTOMY  1953   age 51   TUBAL LIGATION     UPPER GASTROINTESTINAL ENDOSCOPY     vocal cord surgery     "did improve condition"    has no known allergies.   Family History  Problem Relation Age of Onset   Heart disease Mother    Diabetes Mother    Melanoma Father    Heart disease Father    Arthritis Father    Heart disease Sister    Skin cancer Brother        patient unsure of what type of  skin cancer brother had   Colon polyps Brother    Colon cancer Maternal Grandmother 18   Cancer Maternal Aunt 65       sinus cancer   Esophageal cancer Neg Hx    Rectal cancer Neg Hx    Stomach cancer Neg Hx    Breast cancer Neg Hx     Social History   Tobacco Use   Smoking status: Never   Smokeless tobacco: Never  Vaping Use   Vaping status: Never Used  Substance Use Topics   Alcohol use: No   Drug use: No     Review of Systems was negative for a full 10 system review except as noted in the History of Present Illness.   Current  Outpatient Medications:    acetaminophen (TYLENOL) 500 MG tablet, Take 1 tablet (500 mg total) by mouth every 6 (six) hours as needed (pain)., Disp: 30 tablet, Rfl: 0   atorvastatin (LIPITOR) 20 MG tablet, Take 20 mg by mouth in the morning., Disp: , Rfl:    Calcium-Magnesium-Vitamin D 500-50-100 MG-MG-UNIT CHEW, Chew 2 capsules by mouth 2 (two) times daily., Disp: , Rfl:    escitalopram (LEXAPRO) 10 MG tablet, 1 tablet Orally Once a day for 90 days, Disp: , Rfl:    estradiol (ESTRACE) 0.1 MG/GM vaginal cream, PLACE 0.5 GRAMS VAGINALLY AT BEDTIME FOR 14 DAYS THEN USE TWICE WEEKLY, Disp: 42.5 g, Rfl: 0   ibuprofen (ADVIL) 600 MG tablet, Take 1 tablet (600 mg total) by mouth every 6 (six) hours as needed., Disp: 30 tablet, Rfl: 0   Ketotifen Fumarate (REFRESH EYE ITCH RELIEF OP), Place 1 drop into both eyes 4 (four) times daily as needed (eye irritation.)., Disp: , Rfl:    loratadine (CLARITIN) 10 MG tablet, Take 10 mg by mouth in the morning., Disp: , Rfl:    phenylephrine (SUDAFED PE) 10 MG TABS tablet, Take 10 mg by mouth every 4 (four) hours as needed., Disp: , Rfl:    Phenylephrine-Witch Hazel (PREPARATION H) 0.25-50 % GEL, Apply 1 application  topically daily as needed (irritation/discomfort)., Disp: , Rfl:    polyethylene glycol powder (GLYCOLAX/MIRALAX) 17 GM/SCOOP powder, Take 17 g by mouth daily. Drink 17g (1 scoop) dissolved in water per day., Disp: 255 g, Rfl: 0   traMADol (ULTRAM) 50 MG tablet, Take 1 tablet (50 mg total) by mouth every 4 (four) hours as needed for up to 5 days for severe pain (pain score 7-10)., Disp: 15 tablet, Rfl: 0   triamterene-hydrochlorothiazide (MAXZIDE-25) 37.5-25 MG tablet, Take 1 tablet by mouth daily., Disp: , Rfl:    mirabegron ER (MYRBETRIQ) 50 MG TB24 tablet, TAKE 1 TABLET BY MOUTH EVERY DAY, Disp: 30 tablet, Rfl: 5   Objective Vitals:   04/06/23 1023  BP: 125/76  Pulse: 86    Gen: NAD CV: S1 S2 RRR Lungs: Clear to auscultation bilaterally Abd:  soft, nontender   Previous Pelvic Exam showed: POP-Q   3                                            Aa   6                                           Ba  6                                              C    6                                            Gh   4                                            Pb   8                                            tvl    3                                            Ap   6                                            Bp   -3                                           Assessment/ Plan  Assessment: The patient is a 78 y.o. year old scheduled to undergo Exam under anesthesia, robotic assisted total laparoscopic hysterectomy with bilateral salpingo-oophorectomy, sacrocolpopexy, perineorrhaphy, cystoscopy, possible posterior repair. Verbal consent was obtained for these procedures.  Plan: General Surgical Consent: The patient has previously been counseled on alternative treatments, and the decision by the patient and provider was to proceed with the procedure listed above.  For all procedures, there are risks of bleeding, infection, damage to surrounding organs including but not limited to bowel, bladder, blood vessels, ureters and nerves, and need for further surgery if an injury were to occur. These risks are all low with minimally invasive surgery.   There are risks of numbness and weakness at any body site or buttock/rectal pain.  It is possible that baseline pain can be worsened by surgery, either with or without mesh. If surgery is vaginal, there is also a low risk of possible conversion to laparoscopy or open abdominal incision where indicated. Very rare risks include blood transfusion, blood clot, heart attack, pneumonia, or death.   There is also a risk of short-term postoperative urinary retention with need to use a catheter. About half of patients need to go home from surgery with a catheter, which is then later removed in the office.  The risk of long-term need for a catheter is very low. There is also a risk of worsening of overactive bladder.    Prolapse (with or without mesh): Risk factors for surgical failure  include things that put pressure on your pelvis and the surgical repair, including obesity,  chronic cough, and heavy lifting or straining (including lifting children or adults, straining on the toilet, or lifting heavy objects such as furniture or anything weighing >25 lbs. Risks of recurrence is 20-30% with vaginal native tissue repair and a less than 10% with sacrocolpopexy with mesh.    Sacrocolpopexy: Mesh implants may provide more prolapse support, but do have some unique risks to consider. It is important to understand that mesh is permanent and cannot be easily removed. Risks of abdominal sacrocolpopexy mesh include mesh exposure (~3-6%), painful intercourse (recent studies show lower rates after surgery compared to before, with ~5-8% risk of new onset), and very rare risks of bowel or bladder injury or infection (<1%). The risk of mesh exposure is more likely in a woman with risks for poor healing (prior radiation, poorly controlled diabetes, or immunocompromised). The risk of new or worsened chronic pain after mesh implant is more common in women with baseline chronic pain and/or poorly controlled anxiety or depression. There is an FDA safety notification on vaginal mesh procedures for prolapse but NOT abdominal mesh procedures and therefore does not apply to your surgery. We have extensive experience and training with mesh placement and we have close postoperative follow up to identify any potential complications from mesh.    We discussed consent for blood products. Risks for blood transfusion include allergic reactions, other reactions that can affect different body organs and managed accordingly, transmission of infectious diseases such as HIV or Hepatitis. However, the blood is screened. Patient consents for blood  products.  Pre-operative instructions:  She was instructed to not take Aspirin/NSAIDs x 7days prior to surgery.  Antibiotic prophylaxis was ordered as indicated.  Catheter use: Patient will go home with foley if needed after post-operative voiding trial.  Post-operative instructions:  She was provided with specific post-operative instructions, including precautions and signs/symptoms for which we would recommend contacting us, in addition to daytime and after-hours contact phone numbers. This was provided on a handout.   Post-operative medications: Prescriptions for motrin, tylenol, miralax, and Tramadol were sent to her pharmacy. Discussed using ibuprofen and tylenol on a schedule to limit use of narcotics.   Laboratory testing:  We will check labs: CBC and Type and Screen  Preoperative clearance:  She does not require surgical clearance.    Post-operative follow-up:  A post-operative appointment will be made for 6 weeks from the date of surgery. If she needs a post-operative nurse visit for a voiding trial, that will be set up after she leaves the hospital.    Patient will call the clinic or use MyChart should anything change or any new issues arise.   Selmer Dominion, NP

## 2023-04-07 ENCOUNTER — Encounter (HOSPITAL_COMMUNITY): Payer: Self-pay | Admitting: Obstetrics and Gynecology

## 2023-04-07 NOTE — Progress Notes (Signed)
 Spoke w/ via phone for pre-op interview--- pt Lab needs dos----    no     Lab results------ lab appt 04-10-2023 @ 1100 getting CBC/ BMP/ T&S Current EKG in epic/ chart COVID test -----patient states asymptomatic no test needed Arrive at -------  1030 on 04-15-2023 NPO after MN NO Solid Food.  Clear liquids from MN until--- 0930 Pre-Surgery Ensure or G2: n/a ERAS protocol  Med rec completed Medications to take morning of surgery ----- synthroid Diabetic medication ----- n/a  GLP1 agonist last dose: n/a GLP1 instructions:  Patient instructed no nail polish to be worn day of surgery Patient instructed to bring photo id and insurance card day of surgery Patient aware to have Driver (ride ) / caregiver    for 24 hours after surgery - husband, thurman Patient Special Instructions ----- will pick up bag w/ CHG and written instructions at lab appt Pre-Op special Instructions ----- n/a  Patient verbalized understanding of instructions that were given at this phone interview. Patient denies chest pain, sob, fever, cough at the interview.

## 2023-04-07 NOTE — Pre-Procedure Instructions (Signed)
 Surgical Instructions   Your procedure is scheduled on :  Wednesday,  04-15-2023. Report to [facility] Main Entrance "A" at 10:30  A.M., then check in with the Admitting office. Any questions or running late day of surgery: call [phone]  Questions prior to your surgery date: call [phone] or [phone], Monday-Friday, 8am-4pm. If you experience any cold or flu symptoms such as cough, fever, chills, shortness of breath, etc. between now and your scheduled surgery, please notify your surgeon office.    Remember:  Do not eat any food after midnight the night before your surgery. You may have clear liquids from midnight night before surgery until 9:30 AM.   You may drink clear liquids until 9:30 AM the morning of your surgery.   Clear liquids allowed are:  Water Carbonated Beverages Clear Tea (may sweeten, no milk, honey, etc.) Black Coffee Only (may sweeten, NO MILK, CREAM OR POWDERED CREAMER of any kind) Gatorade. NO clear liquids after 9:30 AM day of surgery.  This includes no water,  candy,  gum,  and  mints.    Take these medicines the morning of surgery with A SIP OF WATER : Levothyroxine   May take these medicines IF NEEDED:  Refresh eye drops    One week prior to surgery, STOP taking any Aspirin (unless otherwise instructed by your surgeon) Aleve, Naproxen, Ibuprofen, Motrin, Advil, Goody's, BC's, all herbal medications, fish oil, and non-prescription vitamins.                     Do NOT Smoke (Tobacco/Vaping) for 24 hours prior to your procedure.  If you use a CPAP at night, you may bring your mask/headgear for your overnight stay.   You will be asked to remove any contacts, glasses, piercing's, hearing aid's, dentures/partials prior to surgery. Please bring cases for these items if needed.    Patients discharged the day of surgery will not be allowed to drive home, and someone needs to stay with them for 24 hours.  SURGICAL WAITING ROOM  VISITATION Patients may have no more than 2 support people in the waiting area - these visitors may rotate.   Pre-op nurse will coordinate an appropriate time for 1 ADULT support person, who may not rotate, to accompany patient in pre-op.  Children under the age of 37 must have an adult with them who is not the patient and must remain in the main waiting area with an adult.  If the patient needs to stay at the hospital during part of their recovery, the visitor guidelines for inpatient rooms apply.  Please refer to the [facility] website for the visitor guidelines for any additional information.   If you received a COVID test during your pre-op visit  it is requested that you wear a mask when out in public, stay away from anyone that may not be feeling well and notify your surgeon if you develop symptoms. If you have been in contact with anyone that has tested positive in the last 10 days please notify you surgeon.      Pre-operative CHG Bathing Instructions   You can play a key role in reducing the risk of infection after surgery. Your skin needs to be as free of germs as possible. You can reduce the number of germs on your skin by washing with CHG (chlorhexidine gluconate) soap before surgery. CHG is an antiseptic soap that kills germs and continues to kill germs even after washing.   DO NOT use if  you have an allergy to chlorhexidine/CHG or antibacterial soaps. If your skin becomes reddened or irritated, stop using the CHG and notify Pre-Op nurse day of surgery.  If you have any skin irritation or problems with the surgical soap (CHG), do not use.  Please get a bar of gold dial soap or antibacterial soap and shower following the instructions below.             TAKE A SHOWER THE NIGHT BEFORE SURGERY AND THE DAY OF SURGERY    Please keep in mind the following:  DO NOT shave, including legs and underarms, 48 hours prior to surgery.   You may shave your face before/day of surgery.  Place  clean sheets on your bed the night before surgery Use a clean washcloth (not used since being washed) for each shower. DO NOT sleep with pet's night before surgery.  CHG Shower Instructions:  Wash your face and private area with normal soap. If you choose to wash your hair, wash first with your normal shampoo.  After you use shampoo/soap, rinse your hair and body thoroughly to remove shampoo/soap residue.  Turn the water OFF and apply half the bottle of CHG soap to a CLEAN washcloth.  Apply CHG soap ONLY FROM YOUR NECK DOWN TO YOUR TOES (washing for 3-5 minutes)  DO NOT use CHG soap on face, private areas, open wounds, or sores.  Pay special attention to the area where your surgery is being performed.  If you are having back surgery, having someone wash your back for you may be helpful. Wait 2 minutes after CHG soap is applied, then you may rinse off the CHG soap.  Pat dry with a clean towel  Put on clean pajamas    Additional instructions for the day of surgery: DO NOT APPLY any lotions, oils, deodorants (may use underarm deodorant), cologne/ perfumes or makeup  Do not wear jewelry /  piercing's/  metal Do not wear nail polish, gel polish, artificial nails, or any other type of covering on natural nails (fingers and toes) Do not bring valuables to the hospital. [facility] is not responsible for valuables/personal belongings. Put on clean/comfortable clothes.  Please brush your teeth.  Ask your nurse before applying any prescription medications to the skin.

## 2023-04-10 ENCOUNTER — Other Ambulatory Visit: Payer: Self-pay

## 2023-04-10 ENCOUNTER — Other Ambulatory Visit (HOSPITAL_COMMUNITY): Payer: Self-pay | Admitting: Obstetrics

## 2023-04-10 ENCOUNTER — Encounter: Payer: Self-pay | Admitting: Obstetrics

## 2023-04-10 ENCOUNTER — Encounter (HOSPITAL_COMMUNITY)
Admission: RE | Admit: 2023-04-10 | Discharge: 2023-04-10 | Disposition: A | Discharge status: Home / Self Care | Source: Ambulatory Visit | Attending: Obstetrics and Gynecology | Admitting: Obstetrics and Gynecology

## 2023-04-10 DIAGNOSIS — E876 Hypokalemia: Secondary | ICD-10-CM

## 2023-04-10 DIAGNOSIS — R7989 Other specified abnormal findings of blood chemistry: Secondary | ICD-10-CM

## 2023-04-10 DIAGNOSIS — Z01818 Encounter for other preprocedural examination: Secondary | ICD-10-CM

## 2023-04-10 DIAGNOSIS — Z01812 Encounter for preprocedural laboratory examination: Secondary | ICD-10-CM | POA: Diagnosis present

## 2023-04-10 LAB — BASIC METABOLIC PANEL
Anion gap: 12 (ref 5–15)
BUN: 12 mg/dL (ref 8–23)
CO2: 31 mmol/L (ref 22–32)
Calcium: 10.5 mg/dL — ABNORMAL HIGH (ref 8.9–10.3)
Chloride: 94 mmol/L — ABNORMAL LOW (ref 98–111)
Creatinine, Ser: 1.14 mg/dL — ABNORMAL HIGH (ref 0.44–1.00)
GFR, Estimated: 50 mL/min — ABNORMAL LOW (ref >60)
Glucose, Bld: 110 mg/dL — ABNORMAL HIGH (ref 70–99)
Potassium: 3.4 mmol/L — ABNORMAL LOW (ref 3.5–5.1)
Sodium: 137 mmol/L (ref 135–145)

## 2023-04-10 LAB — CBC
HCT: 40.1 % (ref 36.0–46.0)
Hemoglobin: 12.8 g/dL (ref 12.0–15.0)
MCH: 28.4 pg (ref 26.0–34.0)
MCHC: 31.9 g/dL (ref 30.0–36.0)
MCV: 88.9 fL (ref 80.0–100.0)
Platelets: 289 10*3/uL (ref 150–400)
RBC: 4.51 MIL/uL (ref 3.87–5.11)
RDW: 14.6 % (ref 11.5–15.5)
WBC: 7.7 10*3/uL (ref 4.0–10.5)
nRBC: 0 % (ref 0.0–0.2)

## 2023-04-10 LAB — TYPE AND SCREEN
ABO/RH(D): A POS
Antibody Screen: NEGATIVE

## 2023-04-14 LAB — BASIC METABOLIC PANEL
BUN/Creatinine Ratio: 10 — ABNORMAL LOW (ref 12–28)
BUN: 11 mg/dL (ref 8–27)
CO2: 27 mmol/L (ref 20–29)
Calcium: 9.7 mg/dL (ref 8.7–10.3)
Chloride: 97 mmol/L (ref 96–106)
Creatinine, Ser: 1.1 mg/dL — ABNORMAL HIGH (ref 0.57–1.00)
Glucose: 95 mg/dL (ref 70–99)
Potassium: 3.6 mmol/L (ref 3.5–5.2)
Sodium: 139 mmol/L (ref 134–144)
eGFR: 52 mL/min/{1.73_m2} — ABNORMAL LOW (ref >59)

## 2023-04-15 ENCOUNTER — Ambulatory Visit (HOSPITAL_COMMUNITY)
Admission: RE | Disposition: A | Discharge status: Home / Self Care | Payer: Self-pay | Source: Ambulatory Visit | Attending: Obstetrics and Gynecology

## 2023-04-15 ENCOUNTER — Ambulatory Visit (HOSPITAL_COMMUNITY): Admitting: Certified Registered Nurse Anesthetist

## 2023-04-15 ENCOUNTER — Other Ambulatory Visit: Payer: Self-pay

## 2023-04-15 ENCOUNTER — Ambulatory Visit (HOSPITAL_BASED_OUTPATIENT_CLINIC_OR_DEPARTMENT_OTHER): Admitting: Certified Registered Nurse Anesthetist

## 2023-04-15 ENCOUNTER — Encounter (HOSPITAL_COMMUNITY): Payer: Self-pay | Admitting: Obstetrics and Gynecology

## 2023-04-15 ENCOUNTER — Ambulatory Visit (HOSPITAL_COMMUNITY)
Admission: RE | Admit: 2023-04-15 | Discharge: 2023-04-15 | Disposition: A | Discharge status: Home / Self Care | Source: Ambulatory Visit | Attending: Obstetrics and Gynecology | Admitting: Obstetrics and Gynecology

## 2023-04-15 DIAGNOSIS — I10 Essential (primary) hypertension: Secondary | ICD-10-CM | POA: Diagnosis not present

## 2023-04-15 DIAGNOSIS — F418 Other specified anxiety disorders: Secondary | ICD-10-CM | POA: Insufficient documentation

## 2023-04-15 DIAGNOSIS — N72 Inflammatory disease of cervix uteri: Secondary | ICD-10-CM | POA: Insufficient documentation

## 2023-04-15 DIAGNOSIS — N393 Stress incontinence (female) (male): Secondary | ICD-10-CM | POA: Insufficient documentation

## 2023-04-15 DIAGNOSIS — N813 Complete uterovaginal prolapse: Secondary | ICD-10-CM | POA: Diagnosis present

## 2023-04-15 DIAGNOSIS — N8003 Adenomyosis of the uterus: Secondary | ICD-10-CM | POA: Diagnosis not present

## 2023-04-15 DIAGNOSIS — N812 Incomplete uterovaginal prolapse: Secondary | ICD-10-CM

## 2023-04-15 DIAGNOSIS — R3914 Feeling of incomplete bladder emptying: Secondary | ICD-10-CM

## 2023-04-15 DIAGNOSIS — C5702 Malignant neoplasm of left fallopian tube: Secondary | ICD-10-CM | POA: Diagnosis not present

## 2023-04-15 DIAGNOSIS — Z01818 Encounter for other preprocedural examination: Secondary | ICD-10-CM

## 2023-04-15 HISTORY — DX: Irritable bowel syndrome, unspecified: K58.9

## 2023-04-15 HISTORY — DX: Nodular corneal degeneration, bilateral: H18.453

## 2023-04-15 HISTORY — DX: Complete uterovaginal prolapse: N81.3

## 2023-04-15 HISTORY — DX: Cystocele, unspecified: N81.10

## 2023-04-15 HISTORY — DX: Unspecified osteoarthritis, unspecified site: M19.90

## 2023-04-15 HISTORY — DX: Other seasonal allergic rhinitis: J30.2

## 2023-04-15 HISTORY — DX: Personal history of adenomatous and serrated colon polyps: Z86.0101

## 2023-04-15 HISTORY — DX: Generalized anxiety disorder: F41.1

## 2023-04-15 HISTORY — DX: Personal history of urinary calculi: Z87.442

## 2023-04-15 HISTORY — DX: Diverticulosis of large intestine without perforation or abscess without bleeding: K57.30

## 2023-04-15 LAB — ABO/RH: ABO/RH(D): A POS

## 2023-04-15 SURGERY — ROBOTIC ASSISTED TOTAL HYSTERECTOMY WITH BILATERAL SALPINGO OOPHORECTOMY
Anesthesia: General

## 2023-04-15 MED ORDER — LIDOCAINE-EPINEPHRINE 1 %-1:100000 IJ SOLN
INTRAMUSCULAR | Status: DC | PRN
  Administered 2023-04-15: 9 mL

## 2023-04-15 MED ORDER — ROCURONIUM BROMIDE 10 MG/ML (PF) SYRINGE
PREFILLED_SYRINGE | INTRAVENOUS | Status: DC | PRN
  Administered 2023-04-15: 60 mg via INTRAVENOUS
  Administered 2023-04-15 (×2): 20 mg via INTRAVENOUS

## 2023-04-15 MED ORDER — ENOXAPARIN SODIUM 40 MG/0.4ML IJ SOSY
40.0000 mg | PREFILLED_SYRINGE | INTRAMUSCULAR | Status: AC
Start: 2023-04-15 — End: 2023-04-15
  Administered 2023-04-15: 40 mg via SUBCUTANEOUS

## 2023-04-15 MED ORDER — OXYCODONE HCL 5 MG PO TABS: 5.0000 mg | ORAL_TABLET | Freq: Once | ORAL | Status: DC | PRN

## 2023-04-15 MED ORDER — LIDOCAINE 2% (20 MG/ML) 5 ML SYRINGE
INTRAMUSCULAR | Status: AC
  Filled 2023-04-15: qty 5

## 2023-04-15 MED ORDER — SODIUM CHLORIDE 0.9 % IR SOLN
Status: DC | PRN
  Administered 2023-04-15: 1000 mL
  Administered 2023-04-15: 200 mL via INTRAVESICAL

## 2023-04-15 MED ORDER — DEXAMETHASONE SODIUM PHOSPHATE 10 MG/ML IJ SOLN
INTRAMUSCULAR | Status: DC | PRN
  Administered 2023-04-15: 5 mg via INTRAVENOUS

## 2023-04-15 MED ORDER — GABAPENTIN 300 MG PO CAPS
ORAL_CAPSULE | ORAL | Status: AC
  Filled 2023-04-15: qty 1

## 2023-04-15 MED ORDER — GABAPENTIN 300 MG PO CAPS: 300.0000 mg | ORAL_CAPSULE | ORAL | Status: DC

## 2023-04-15 MED ORDER — SUGAMMADEX SODIUM 200 MG/2ML IV SOLN
INTRAVENOUS | Status: DC | PRN
  Administered 2023-04-15 (×3): 100 mg via INTRAVENOUS

## 2023-04-15 MED ORDER — SUGAMMADEX SODIUM 200 MG/2ML IV SOLN
INTRAVENOUS | Status: AC
  Filled 2023-04-15: qty 2

## 2023-04-15 MED ORDER — ACETAMINOPHEN 10 MG/ML IV SOLN
INTRAVENOUS | Status: AC
Start: 2023-04-15 — End: 2023-04-15
  Filled 2023-04-15: qty 100

## 2023-04-15 MED ORDER — ALBUMIN HUMAN 5 % IV SOLN: INTRAVENOUS | Status: DC | PRN

## 2023-04-15 MED ORDER — ROCURONIUM BROMIDE 10 MG/ML (PF) SYRINGE
PREFILLED_SYRINGE | INTRAVENOUS | Status: AC
  Filled 2023-04-15: qty 10

## 2023-04-15 MED ORDER — ONDANSETRON HCL 4 MG/2ML IJ SOLN
INTRAMUSCULAR | Status: DC | PRN
  Administered 2023-04-15: 4 mg via INTRAVENOUS

## 2023-04-15 MED ORDER — LIDOCAINE-EPINEPHRINE 1 %-1:100000 IJ SOLN
INTRAMUSCULAR | Status: AC
Start: 2023-04-15 — End: ?
  Filled 2023-04-15: qty 1

## 2023-04-15 MED ORDER — CEFAZOLIN SODIUM-DEXTROSE 2-4 GM/100ML-% IV SOLN
INTRAVENOUS | Status: AC
  Filled 2023-04-15: qty 100

## 2023-04-15 MED ORDER — PROPOFOL 10 MG/ML IV BOLUS
INTRAVENOUS | Status: DC | PRN
Start: 2023-04-15 — End: 2023-04-15
  Administered 2023-04-15: 110 mg via INTRAVENOUS

## 2023-04-15 MED ORDER — PROPOFOL 10 MG/ML IV BOLUS
INTRAVENOUS | Status: AC
  Filled 2023-04-15: qty 20

## 2023-04-15 MED ORDER — CHLORHEXIDINE GLUCONATE 0.12 % MT SOLN
OROMUCOSAL | Status: AC
  Filled 2023-04-15: qty 15

## 2023-04-15 MED ORDER — ENOXAPARIN SODIUM 40 MG/0.4ML IJ SOSY
PREFILLED_SYRINGE | INTRAMUSCULAR | Status: AC
  Filled 2023-04-15: qty 0.4

## 2023-04-15 MED ORDER — LIDOCAINE 2% (20 MG/ML) 5 ML SYRINGE
INTRAMUSCULAR | Status: DC | PRN
  Administered 2023-04-15: 60 mg via INTRAVENOUS

## 2023-04-15 MED ORDER — HYDROMORPHONE HCL 1 MG/ML IJ SOLN: 0.2500 mg | INTRAMUSCULAR | Status: DC | PRN

## 2023-04-15 MED ORDER — DEXMEDETOMIDINE HCL IN NACL 80 MCG/20ML IV SOLN
INTRAVENOUS | Status: DC | PRN
  Administered 2023-04-15 (×2): 4 ug via INTRAVENOUS

## 2023-04-15 MED ORDER — FENTANYL CITRATE (PF) 250 MCG/5ML IJ SOLN
INTRAMUSCULAR | Status: AC
Start: 2023-04-15 — End: ?
  Filled 2023-04-15: qty 5

## 2023-04-15 MED ORDER — BUPIVACAINE HCL (PF) 0.25 % IJ SOLN
INTRAMUSCULAR | Status: AC
  Filled 2023-04-15: qty 30

## 2023-04-15 MED ORDER — DEXAMETHASONE SODIUM PHOSPHATE 10 MG/ML IJ SOLN
INTRAMUSCULAR | Status: AC
Start: 2023-04-15 — End: ?
  Filled 2023-04-15: qty 1

## 2023-04-15 MED ORDER — AMISULPRIDE (ANTIEMETIC) 5 MG/2ML IV SOLN: 10.0000 mg | Freq: Once | INTRAVENOUS | Status: DC | PRN

## 2023-04-15 MED ORDER — ACETAMINOPHEN 10 MG/ML IV SOLN
INTRAVENOUS | Status: DC | PRN
Start: 2023-04-15 — End: 2023-04-15
  Administered 2023-04-15: 1000 mg via INTRAVENOUS

## 2023-04-15 MED ORDER — ORAL CARE MOUTH RINSE: 15.0000 mL | Freq: Once | OROMUCOSAL | Status: AC

## 2023-04-15 MED ORDER — CHLORHEXIDINE GLUCONATE 0.12 % MT SOLN
15.0000 mL | Freq: Once | OROMUCOSAL | Status: AC
  Administered 2023-04-15: 15 mL via OROMUCOSAL

## 2023-04-15 MED ORDER — SODIUM CHLORIDE 0.9 % IV SOLN: 12.5000 mg | INTRAVENOUS | Status: DC | PRN

## 2023-04-15 MED ORDER — LACTATED RINGERS IV SOLN
INTRAVENOUS | Status: DC
  Administered 2023-04-15: 1000 mL via INTRAVENOUS

## 2023-04-15 MED ORDER — PHENYLEPHRINE HCL (PRESSORS) 10 MG/ML IV SOLN
INTRAVENOUS | Status: DC | PRN
Start: 2023-04-15 — End: 2023-04-15
  Administered 2023-04-15 (×5): 80 ug via INTRAVENOUS

## 2023-04-15 MED ORDER — OXYCODONE HCL 5 MG/5ML PO SOLN: 5.0000 mg | Freq: Once | ORAL | Status: DC | PRN

## 2023-04-15 MED ORDER — FENTANYL CITRATE (PF) 100 MCG/2ML IJ SOLN
INTRAMUSCULAR | Status: DC | PRN
  Administered 2023-04-15: 25 ug via INTRAVENOUS
  Administered 2023-04-15: 50 ug via INTRAVENOUS
  Administered 2023-04-15: 100 ug via INTRAVENOUS

## 2023-04-15 MED ORDER — FLUORESCEIN SODIUM 10 % IV SOLN
INTRAVENOUS | Status: AC
  Filled 2023-04-15: qty 5

## 2023-04-15 MED ORDER — BUPIVACAINE HCL (PF) 0.25 % IJ SOLN
INTRAMUSCULAR | Status: DC | PRN
  Administered 2023-04-15: 20 mL

## 2023-04-15 MED ORDER — ONDANSETRON HCL 4 MG/2ML IJ SOLN
INTRAMUSCULAR | Status: AC
  Filled 2023-04-15: qty 2

## 2023-04-15 MED ORDER — ACETAMINOPHEN 500 MG PO TABS
1000.0000 mg | ORAL_TABLET | ORAL | Status: DC
  Filled 2023-04-15: qty 2

## 2023-04-15 MED ORDER — CEFAZOLIN SODIUM-DEXTROSE 2-4 GM/100ML-% IV SOLN
2.0000 g | INTRAVENOUS | Status: AC
  Administered 2023-04-15: 2 g via INTRAVENOUS

## 2023-04-15 SURGICAL SUPPLY — 87 items
BARRIER ADHS 3X4 INTERCEED (GAUZE/BANDAGES/DRESSINGS) IMPLANT
BLADE SURG 15 STRL LF DISP TIS (BLADE) ×1 IMPLANT
CATH FOLEY 3WAY 5CC 16FR (CATHETERS) ×1 IMPLANT
CHLORAPREP W/TINT 26 (MISCELLANEOUS) ×1 IMPLANT
COVER BACK TABLE 60X90IN (DRAPES) ×1 IMPLANT
COVER TIP SHEARS 8 DVNC (MISCELLANEOUS) ×1 IMPLANT
DEFOGGER SCOPE WARMER CLEARIFY (MISCELLANEOUS) ×1 IMPLANT
DERMABOND ADVANCED .7 DNX12 (GAUZE/BANDAGES/DRESSINGS) ×1 IMPLANT
DRAPE ARM DVNC X/XI (DISPOSABLE) ×4 IMPLANT
DRAPE COLUMN DVNC XI (DISPOSABLE) ×1 IMPLANT
DRAPE SHEET LG 3/4 BI-LAMINATE (DRAPES) ×1 IMPLANT
DRAPE SURG IRRIG POUCH 19X23 (DRAPES) ×1 IMPLANT
DRAPE UTILITY XL STRL (DRAPES) ×1 IMPLANT
DRIVER NDL LRG 8 DVNC XI (INSTRUMENTS) IMPLANT
DRIVER NDL MEGA SUTCUT DVNCXI (INSTRUMENTS) IMPLANT
DRIVER NDLE LRG 8 DVNC XI (INSTRUMENTS) ×1 IMPLANT
DRIVER NDLE MEGA SUTCUT DVNCXI (INSTRUMENTS) ×1 IMPLANT
ELECT REM PT RETURN 9FT ADLT (ELECTROSURGICAL) ×1 IMPLANT
ELECTRODE REM PT RTRN 9FT ADLT (ELECTROSURGICAL) ×1 IMPLANT
FORCEPS BPLR 8 MD DVNC XI (FORCEP) IMPLANT
GAUZE 4X4 16PLY ~~LOC~~+RFID DBL (SPONGE) IMPLANT
GLOVE BIOGEL PI IND STRL 6.5 (GLOVE) ×3 IMPLANT
GLOVE BIOGEL PI IND STRL 7.0 (GLOVE) ×1 IMPLANT
GLOVE ECLIPSE 6.0 STRL STRAW (GLOVE) ×3 IMPLANT
GLOVE SURG UNDER POLY LF SZ6.5 (GLOVE) ×4 IMPLANT
GOWN STRL REUS W/ TWL LRG LVL3 (GOWN DISPOSABLE) ×1 IMPLANT
GOWN STRL REUS W/TWL LRG LVL3 (GOWN DISPOSABLE) ×1 IMPLANT
GRASPER TIP-UP FEN DVNC XI (INSTRUMENTS) IMPLANT
HIBICLENS CHG 4% 4OZ (MISCELLANEOUS) ×1 IMPLANT
HIBICLENS CHG 4% 4OZ BTL (MISCELLANEOUS) ×1 IMPLANT
HOLDER FOLEY CATH W/STRAP (MISCELLANEOUS) ×1 IMPLANT
IRRIG SUCT STRYKERFLOW 2 WTIP (MISCELLANEOUS) ×1 IMPLANT
IRRIGATION STRYKERFLOW (MISCELLANEOUS) ×1 IMPLANT
IRRIGATION SUCT STRKRFLW 2 WTP (MISCELLANEOUS) ×1 IMPLANT
IRRIGATOR STRYKERFLOW (MISCELLANEOUS) ×1 IMPLANT
KIT PINK PAD W/HEAD ARE REST (MISCELLANEOUS) ×1 IMPLANT
KIT PINK PAD W/HEAD ARM REST (MISCELLANEOUS) ×1 IMPLANT
KIT TURNOVER KIT B (KITS) ×1 IMPLANT
LEGGING LITHOTOMY PAIR STRL (DRAPES) ×1 IMPLANT
MANIFOLD NEPTUNE II (INSTRUMENTS) ×1 IMPLANT
MANIPULATOR ADVINCU DEL 2.5 PL (MISCELLANEOUS) IMPLANT
MANIPULATOR ADVINCU DEL 3.0 PL (MISCELLANEOUS) IMPLANT
MANIPULATOR ADVINCU DEL 3.5 PL (MISCELLANEOUS) IMPLANT
MANIPULATOR ADVINCU DEL 4.0 PL (MISCELLANEOUS) IMPLANT
MESH VERTESSA LITE -Y 2X4X3 (Mesh General) ×1 IMPLANT
NDL HYPO 22X1.5 SAFETY MO (MISCELLANEOUS) ×1 IMPLANT
NDL INSUFFLATION 14GA 120MM (NEEDLE) ×1 IMPLANT
NEEDLE HYPO 22X1.5 SAFETY MO (MISCELLANEOUS) ×1 IMPLANT
NEEDLE INSUFFLATION 14GA 120MM (NEEDLE) ×1 IMPLANT
NS IRRIG 1000ML POUR BTL (IV SOLUTION) ×1 IMPLANT
OBTURATOR OPTICAL STND 8 DVNC (TROCAR) ×1 IMPLANT
OBTURATOR OPTICALSTD 8 DVNC (TROCAR) ×1 IMPLANT
PACK CYSTO (CUSTOM PROCEDURE TRAY) ×1 IMPLANT
PACK ROBOT WH (CUSTOM PROCEDURE TRAY) ×1 IMPLANT
PACK ROBOTIC GOWN (GOWN DISPOSABLE) ×1 IMPLANT
PACK VAGINAL WOMENS (CUSTOM PROCEDURE TRAY) ×1 IMPLANT
PAD OB MATERNITY 11 LF (PERSONAL CARE ITEMS) ×1 IMPLANT
PATTIES SURGICAL .5 X3 (DISPOSABLE) IMPLANT
RETRACTOR STAY HOOK 5MM (MISCELLANEOUS) ×1 IMPLANT
SCISSORS MNPLR CVD DVNC XI (INSTRUMENTS) IMPLANT
SEAL UNIV 5-12 XI (MISCELLANEOUS) ×5 IMPLANT
SEALER VESSEL EXT DVNC XI (MISCELLANEOUS) IMPLANT
SET CYSTO W/LG BORE CLAMP LF (SET/KITS/TRAYS/PACK) ×1 IMPLANT
SET IRRIG Y TYPE TUR BLADDER L (SET/KITS/TRAYS/PACK) ×1 IMPLANT
SET TUBE SMOKE EVAC HIGH FLOW (TUBING) ×1 IMPLANT
SLEEVE SCD COMPRESS KNEE MED (STOCKING) ×1 IMPLANT
SPIKE FLUID TRANSFER (MISCELLANEOUS) ×4 IMPLANT
SUCTION TUBE FRAZIER 10FR DISP (SUCTIONS) ×1 IMPLANT
SURGIFLO W/THROMBIN 8M KIT (HEMOSTASIS) IMPLANT
SUT GORETEX NAB #0 THX26 36IN (SUTURE) ×2 IMPLANT
SUT MNCRL AB 4-0 PS2 18 (SUTURE) ×2 IMPLANT
SUT MON AB 2-0 SH 27 (SUTURE) ×1 IMPLANT
SUT STRATA PDS 2-0 23 CT-1 (SUTURE) ×2 IMPLANT
SUT V-LOC BARB 180 2/0GR9 GS23 (SUTURE) ×2 IMPLANT
SUT VIC AB 0 CT1 27XBRD ANBCTR (SUTURE) ×1 IMPLANT
SUT VIC AB 0 CT1 27XBRD ANTBC (SUTURE) ×1 IMPLANT
SUT VIC AB 2-0 SH 27XBRD (SUTURE) ×1 IMPLANT
SUT VIC AB 3-0 SH 18 (SUTURE) IMPLANT
SUT VICRYL 2-0 SH 8X27 (SUTURE) ×1 IMPLANT
SUT VLOC 180 0 9IN GS21 (SUTURE) ×1 IMPLANT
SUTURE V-LC BRB 180 2/0GR9GS23 (SUTURE) ×2 IMPLANT
SYR BULB EAR ULCER 3OZ GRN STR (SYRINGE) ×1 IMPLANT
TOWEL GREEN STERILE (TOWEL DISPOSABLE) ×1 IMPLANT
TOWEL GREEN STERILE FF (TOWEL DISPOSABLE) ×1 IMPLANT
TRAY FOLEY W/BAG SLVR 14FR LF (SET/KITS/TRAYS/PACK) ×1 IMPLANT
UNDERPAD 30X36 HEAVY ABSORB (UNDERPADS AND DIAPERS) ×1 IMPLANT
WATER STERILE IRR 1000ML POUR (IV SOLUTION) ×1 IMPLANT

## 2023-04-15 NOTE — Discharge Instructions (Signed)
 POST OPERATIVE INSTRUCTIONS  General Instructions Recovery (not bed rest) will last approximately 6 weeks Walking is encouraged, but refrain from strenuous exercise/ housework/ heavy lifting. No lifting >10lbs  Nothing in the vagina- NO intercourse, tampons or douching Bathing:  Do not submerge in water (NO swimming, bath, hot tub, etc) until after your postop visit. You can shower starting the day after surgery.  No driving until you are not taking narcotic pain medicine and until your pain is well enough controlled that you can slam on the breaks or make sudden movements if needed.   Taking your medications Please take your acetaminophen and ibuprofen on a schedule for the first 48 hours. Take 600mg  ibuprofen, then take 500mg  acetaminophen 3 hours later, then continue to alternate ibuprofen and acetaminophen. That way you are taking each type of medication every 6 hours. Take the prescribed narcotic (oxycodone, tramadol, etc) as needed, with a maximum being every 4 hours.  Take a stool softener daily to keep your stools soft and preventing you from straining. If you have diarrhea, you decrease your stool softener. This is explained more below. We have prescribed you Miralax.  Reasons to Call the Nurse (see last page for phone numbers) Heavy Bleeding (changing your pad every 1-2 hours) Persistent nausea/vomiting Fever (100.4 degrees or more) Incision problems (pus or other fluid coming out, redness, warmth, increased pain)  Things to Expect After Surgery Mild to Moderate pain is normal during the first day or two after surgery. If prescribed, take Ibuprofen or Tylenol first and use the stronger medicine for "break-through" pain. You can overlap these medicines because they work differently.   Constipation   To Prevent Constipation:  Eat a well-balanced diet including protein, grains, fresh fruit and vegetables.  Drink plenty of fluids. Walk regularly.  Depending on specific instructions  from your physician: take Miralax daily and additionally you can add a stool softener (colace/ docusate) and fiber supplement. Continue as long as you're on pain medications.   To Treat Constipation:  If you do not have a bowel movement in 2 days after surgery, you can take 2 Tbs of Milk of Magnesia 1-2 times a day until you have a bowel movement. If diarrhea occurs, decrease the amount or stop the laxative. If no results with Milk of Magnesia, you can drink a bottle of magnesium citrate which you can purchase over the counter.  Fatigue:  This is a normal response to surgery and will improve with time.  Plan frequent rest periods throughout the day.  Gas Pain:  This is very common but can also be very painful! Drink warm liquids such as herbal teas, bouillon or soup. Walking will help you pass more gas.  Mylicon or Gas-X can be taken over the counter.  Leaking Urine:  Varying amounts of leakage may occur after surgery.  This should improve with time. Your bladder needs at least 3 months to recover from surgery. If you leak after surgery, be sure to mention this to your doctor at your post-op visit. If you were taking medications for overactive bladder prior to surgery, be sure to restart the medications immediately after surgery.  Incisions: If you have incisions on your abdomen, the skin glue will dissolve on its own over time. It is ok to gently rinse with soap and water over these incisions but do not scrub.  Catheter Due to need for your bladder to rest, we will keep the catheter in place and have you return to the office on  Monday for removal.   Return to Work  As work demands and recovery times vary widely, it is hard to predict when you will want to return to work. If you have a desk job with no strenuous physical activity, and if you would like to return sooner than generally recommended, discuss this with your provider or call our office.   Post op concerns  For non-emergent issues,  please call the Urogynecology Nurse. Please leave a message and someone will contact you within one business day.  You can also send a message through MyChart.   AFTER HOURS (After 5:00 PM and on weekends):  For urgent matters that cannot wait until the next business day. Call our office [phone] and connect to the doctor on call.  Please reserve this for important issues.   **FOR ANY TRUE EMERGENCY ISSUES CALL 911 OR GO TO THE NEAREST EMERGENCY ROOM.** Please inform our office or the doctor on call of any emergency.     APPOINTMENTS: Call [phone]

## 2023-04-15 NOTE — Anesthesia Procedure Notes (Addendum)
 Procedure Name: Intubation Date/Time: 04/15/2023 12:21 PM  Performed by: Bishop Limbo, CRNAPre-anesthesia Checklist: Patient identified, Emergency Drugs available, Suction available and Patient being monitored Patient Re-evaluated:Patient Re-evaluated prior to induction Oxygen Delivery Method: Circle System Utilized Preoxygenation: Pre-oxygenation with 100% oxygen Induction Type: IV induction Ventilation: Mask ventilation without difficulty Laryngoscope Size: Mac and 3 Grade View: Grade II Tube type: Oral Tube size: 7.0 mm Number of attempts: 1 Airway Equipment and Method: Stylet Placement Confirmation: ETT inserted through vocal cords under direct vision, positive ETCO2 and breath sounds checked- equal and bilateral Secured at: 22 cm Tube secured with: Tape Dental Injury: Teeth and Oropharynx as per pre-operative assessment  Comments: Intubation performed by Nils Pyle, SRNA.

## 2023-04-15 NOTE — Op Note (Addendum)
 Operative Note  Preoperative Diagnosis: anterior vaginal prolapse, posterior vaginal prolapse, uterovaginal prolapse, incomplete, and incomplete bladder emptying  Postoperative Diagnosis: same  Procedures performed:  Robotic assisted total laparoscopic hysterectomy, with bilateral salpingo-oophorectomy, sacrocolpopexy Lorna Few Lite Y), perineorrhaphy, cystoscopy  Implants:  Implant Name Type Inv. Item Serial No. Manufacturer Lot No. LRB No. Used Action  MESH Grayland Ormond [id number] ([id number] Mesh General MESH Arlys John  [facility] MEDICAL [id number] N/A 1 Implanted    Attending Surgeon: Lanetta Inch, MD   Assistant: Donne Hazel RNFA  Anesthesia: General endotracheal  Findings: 1. On vaginal exam, stage IV prolapse present  2. On cystoscopy, bladder mucosa with trabeculations, normal urethral mucosa. No evidence of bladder injury. Brisk bilateral ureteral efflux present.    3. On laparoscopy, no significant adhesions noted. Normal appearing uterus, bilateral fallopian tubes and ovaries. Enlarged ureters present bilaterally.  Specimens:  ID Type Source Tests Collected by Time Destination  1 : Uterus cervix bilateral tubes and ovaries Tissue PATH Soft tissue SURGICAL PATHOLOGY Marguerita Beards, MD 04/15/2023 1252     Estimated blood loss: 100 mL  IV fluids: 1000 mL  Urine output: 400 mL  Complications: none  Procedure in Detail:  After informed consent was obtained, the patient was taken to the operating room, where general anesthesia was induced and found to be adequate. She was placed in dorsolithotomy position in yellowfin stirrups. Her hips were noted not to be hyperflexed or hyperextended. Her arms were padded with gel pads and tucked to her sides. Her hands were surrounded by foam. A padded strap was placed across her chest with foam between the pad and her skin. She was noted to be appropriately positioned with all pressure points well padded and off  tension. A tilt test showed no slippage. She was prepped and draped in the usual sterile fashion. A uterine manipulator was placed in the uterus after sounding to 7 cm, an appropriately sized Koh ring was placed around the cervix, and a pneumo-occluder balloon was positioned in the vagina for later use.  A sterile Foley catheter was inserted.   0.25% plain Marcaine was injected in the supraumbilical  area and an 8 mm supraumbilical skin incision was made with the scalpel.  A Veress needle was inserted into the incision, CO2 insufflation was started, a low opening pressure was noted, and pneumoperitoneum was obtained. The Veress needle was removed and a 8mm robotic trocar was placed. The robotic camera was inserted and intraperitoneal placement was confirmed. Survey of the abdomen and pelvis revealed the findings as noted above. The sacrum appeared to be free of any adhesive disease. After determining placement for the other ports, Local anesthetic was injected at each site and two 8 mm incisions were made for robotic ports at 10 cm lateral to and at the level of the umbilical port. Two additional 8 mm incisions were made 10 cm lateral to these and 30 degrees down followed by 8 mm robotic ports - the right side for an assistant port. All trocars were placed sequentially under direct visualization of the camera. The patient was placed in Trendelenburg. The robot was docked on the patient's right side. Monopolar endoshears were placed in the right arm, a [place] bipolar grasper was placed in the 2nd arm of the patient's left side, and a Tip up grasper was placed in the 3rd arm on the patient's left side.   Attention was then turned to the sacral promontory. The peritoneum overlying the sacral promontory was  tented up, dissected sharply with monopolar scissors and electrosurgery using layer by layer technique. The peritoneal incision was extended down to the posterior cul-de-sac. This was performed with care to  avoid the ureter on the right side and the sigmoid colon and its mesentary on the left side. Two transverse sutures of CV2 Gortex were placed in the anterior longitudinal ligament.  Attention was then turned to the robotic hysterectomy and BSO. The ureter was identified and was found to be well away from the planned site of incision. Using the monopolar scissors, a window was made on the posterior leaf of the broad ligament. The infundibulopelvic ligament was cauterized and transected. The right round ligament was grasped, cauterized, and transected with electrocautery. The anterior and posterior leaves of the broad ligament were taken down with cautery and sharp dissection. The uterine artery was skeletonized and the bladder flap was created on the right side with a combination of electrosurgery and sharp dissection. The KOH ring was identified. The right uterine artery was clamped, cauterized, and transected. In a similar fashion, the left side was taken down. The bladder was backfilled with 200cc. Further sharp dissection with combination of cautery was performed to further develop the bladder flap. The bladder was drained. At this point, the KOH ring was completely hugging the cervix. The pneumo-occluder balloon in the vagina was inflated to maintain pneumoperitoneum. A colpotomy was performed with electrosurgical cutting current and the uterus and cervix were completely amputated from the vagina. The specimen was delivered through the vagina. The posterior portion of the vaginal cuff was then grasped and pulled up to maintain pneumoperitoneum. The pneumo-occluder balloon was then replaced in the vagina. The right hand instrument was changed to a suture-cut needle driver. The vaginal cuff was then closed using a 0 V-lock suture in two layers.    The right hand instrument was replaced with monopolar endoshears. The bladder was again backfilled with 200 cc to help delineate correct planes. With a lucite probe in  the vagina, the anterior vaginal dissection was then performed with sharp dissection and electrosurgery. The bladder was drained. With the anterior dissection, part of the vagina had split near the apex. This was reapproximated with a 0-vloc suture. The posterior vaginal dissection was then performed with sharp dissection and electrosurgery in order to dissect the rectum away from the posterior vagina.  A "Y" mesh was then inserted into the abdomen after trimming to appropriate size. With the probe in the vagina, the anterior leaf of the Y mesh was affixed to the anterior portion of the vagina using a 2-0 v-loc suture in a spiral pattern to distribute the suture evenly across the surface of the anterior mesh leaf. In a similar fashion, the posterior leaf of the Y mesh was attached to the posterior surface of the vagina with 2-0 v-loc suture.  The distal end of the mesh was then brought to overlie the sacrum. The correct amount of tension was determined in order to elevate the vagina, but not put the mesh under tension. The distal end of the mesh was then affixed to the anterior longitudinal sacral ligament using the previously placed stitches of CV2 Gortex. The excess distal mesh was then cut and removed. The peritoneum was reapproximated over the mesh using 2-0 monocryl. The bladder flap was incorporated to completely retroperitonealize the mesh. All pedicles were carefully inspected and noted to be hemostatic as the CO2 gas was deflated. All instruments were removed from the patient's abdomen.   The Foley  catheter was removed.  A 70-degree cystoscope was introduced, and 360-degree inspection revealed no injury, lesion or foreign body in the bladder. Brisk bilateral ureteral efflux was noted with the assistance of pyridium.  The bladder was drained and the cystoscope was removed.  The Foley catheter was replaced.  The robot was undocked. The CO2 gas was removed and the ports were removed.  The skin incisions  were closed with subcutaneous stitches of 4-0 Monocryl and covered with skin glue.    Attention was then turned to the perineum. Two allis clamps were placed at the introitus. The perineum was injected with 1% lidocaine with epinephrine. A diamond shaped incision was made over the perineum and excess skin was removed. Dissection was performed with Metzenbaum scissors to separate the mucosa from the underlying tissue. The perineal body was then reapproximated with two interrupted 0-vicryl sutures. The perineal skin was then closed with a 2-0 vicryl in a subcutaneous and subcuticular fashion. Irrigation was performed and good hemostasis was noted. Sponge, lap, and needle counts were correct x 2. The patient tolerated the procedure well. She was awakened from anesthesia and transferred to the recovery room in stable condition.   Marguerita Beards, MD

## 2023-04-15 NOTE — Interval H&P Note (Signed)
 History and Physical Interval Note:  04/15/2023 11:15 AM  Jacqueline Ortiz  has presented today for surgery, with the diagnosis of uterovaginal prolapse complete.  The various methods of treatment have been discussed with the patient and family. After consideration of risks, benefits and other options for treatment, the patient has consented to  Procedure(s) with comments: ROBOTIC ASSISTED TOTAL HYSTERECTOMY WITH BILATERAL SALPINGO OOPHORECTOMY (N/A)  SACROCOLPOPEXY, ROBOT-ASSISTED, LAPAROSCOPIC (N/A) PERINEOPLASTY (N/A) COLPORRHAPHY, POSTERIOR, FOR RECTOCELE REPAIR (N/A) CYSTOSCOPY (N/A) as a surgical intervention.  The patient's history has been reviewed, patient examined, no change in status, stable for surgery.  I have reviewed the patient's chart and labs.  Questions were answered to the patient's satisfaction.     Marguerita Beards

## 2023-04-15 NOTE — Transfer of Care (Signed)
 Immediate Anesthesia Transfer of Care Note  Patient: Jacqueline Ortiz  Procedure(s) Performed: ROBOTIC ASSISTED TOTAL HYSTERECTOMY WITH BILATERAL SALPINGO OOPHORECTOMY SACROCOLPOPEXY, ROBOT-ASSISTED, LAPAROSCOPIC PERINEOPLASTY CYSTOSCOPY  Patient Location: PACU  Anesthesia Type:General  Level of Consciousness: sedated  Airway & Oxygen Therapy: Patient Spontanous Breathing and Patient connected to nasal cannula oxygen  Post-op Assessment: Report given to RN and Post -op Vital signs reviewed and stable  Post vital signs: Reviewed and stable  Last Vitals:  Vitals Value Taken Time  BP 132/76 04/15/23 1710  Temp    Pulse 82 04/15/23 1712  Resp 15 04/15/23 1712  SpO2 96 % 04/15/23 1712  Vitals shown include unfiled device data.  Last Pain:  Vitals:   04/15/23 1054  TempSrc:   PainSc: 0-No pain      Patients Stated Pain Goal: 7 (04/15/23 1054)  Complications: No notable events documented.

## 2023-04-15 NOTE — Anesthesia Preprocedure Evaluation (Addendum)
 Anesthesia Evaluation  Patient identified by MRN, date of birth, ID band Patient awake    Reviewed: Allergy & Precautions, H&P , NPO status , Patient's Chart, lab work & pertinent test results  Airway Mallampati: III  TM Distance: >3 FB Neck ROM: Full    Dental  (+) Teeth Intact, Dental Advisory Given, Caps   Pulmonary neg pulmonary ROS   Pulmonary exam normal breath sounds clear to auscultation       Cardiovascular hypertension, Pt. on medications Normal cardiovascular exam Rhythm:Regular Rate:Normal     Neuro/Psych   Anxiety Depression    negative neurological ROS  negative psych ROS   GI/Hepatic Neg liver ROS,GERD  ,,  Endo/Other  negative endocrine ROS    Renal/GU negative Renal ROS  negative genitourinary   Musculoskeletal  (+) Arthritis , Osteoarthritis,    Abdominal   Peds negative pediatric ROS (+)  Hematology negative hematology ROS (+)   Anesthesia Other Findings vocal cord paralysis (1993, history of surgery  Reproductive/Obstetrics negative OB ROS                              Anesthesia Physical Anesthesia Plan  ASA: 2  Anesthesia Plan: General   Post-op Pain Management:    Induction: Intravenous  PONV Risk Score and Plan: 3 and Ondansetron, Dexamethasone, Treatment may vary due to age or medical condition and Midazolam  Airway Management Planned: Oral ETT  Additional Equipment:   Intra-op Plan:   Post-operative Plan: Extubation in OR  Informed Consent: I have reviewed the patients History and Physical, chart, labs and discussed the procedure including the risks, benefits and alternatives for the proposed anesthesia with the patient or authorized representative who has indicated his/her understanding and acceptance.     Dental advisory given  Plan Discussed with: CRNA  Anesthesia Plan Comments: (PAT note written 04/25/2021 by Shonna Chock, PA-C. )          Anesthesia Quick Evaluation

## 2023-04-16 ENCOUNTER — Encounter (HOSPITAL_COMMUNITY): Payer: Self-pay | Admitting: Obstetrics and Gynecology

## 2023-04-16 ENCOUNTER — Telehealth: Payer: Self-pay

## 2023-04-16 ENCOUNTER — Telehealth: Payer: Self-pay | Admitting: Obstetrics and Gynecology

## 2023-04-16 MED FILL — Gabapentin Cap 300 MG: ORAL | Qty: 1 | Status: AC

## 2023-04-16 NOTE — Telephone Encounter (Signed)
 Error

## 2023-04-16 NOTE — Telephone Encounter (Signed)
 Called and spoke to patient: Patient reports her incisions are sore but her pain overall is about 4/10. Patient reports she is taking Tylenol and ibuprofen. Not taking any narcotic for pain control. Patient reports she has not had a bowel movement yet. She has not yet started the miralax. She reports she will start this.  Endorses some spotting blood but has only had to change her pad x2 since surgery.  Reports no issues with catheter.  Encouraged her to call with any concerns. She is scheduled for a voiding trial on 3/25 at 0900.

## 2023-04-16 NOTE — Anesthesia Postprocedure Evaluation (Signed)
 Anesthesia Post Note  Patient: Jacqueline Ortiz  Procedure(s) Performed: ROBOTIC ASSISTED TOTAL HYSTERECTOMY WITH BILATERAL SALPINGO OOPHORECTOMY SACROCOLPOPEXY, ROBOT-ASSISTED, LAPAROSCOPIC PERINEOPLASTY CYSTOSCOPY     Patient location during evaluation: PACU Anesthesia Type: General Level of consciousness: awake and alert Pain management: pain level controlled Vital Signs Assessment: post-procedure vital signs reviewed and stable Respiratory status: spontaneous breathing, nonlabored ventilation, respiratory function stable and patient connected to nasal cannula oxygen Cardiovascular status: blood pressure returned to baseline and stable Postop Assessment: no apparent nausea or vomiting Anesthetic complications: no   No notable events documented.  Last Vitals:  Vitals:   04/15/23 1830 04/15/23 1900  BP: 122/73 122/75  Pulse: 78 85  Resp: 13 18  Temp:  36.7 C  SpO2: 92% 95%    Last Pain:  Vitals:   04/15/23 1900  TempSrc:   PainSc: 0-No pain                 Didier Brandenburg S

## 2023-04-16 NOTE — Telephone Encounter (Signed)
 Georges Lynch underwent Robotic assisted total laparoscopic hysterectomy, with bilateral salpingo-oophorectomy, sacrocolpopexy Lorna Few Lite Y), perineorrhaphy, cystoscopy on 04/15/23.   Due to history of urinary retention, she was sent home with a catheter. Please call her for a routine post op check and to schedule a voiding trial by 3/24-3/25. Thanks!  Marguerita Beards, MD

## 2023-04-20 ENCOUNTER — Telehealth: Payer: Self-pay | Admitting: Obstetrics and Gynecology

## 2023-04-20 ENCOUNTER — Other Ambulatory Visit (HOSPITAL_COMMUNITY): Payer: Self-pay | Admitting: Obstetrics and Gynecology

## 2023-04-20 DIAGNOSIS — C5702 Malignant neoplasm of left fallopian tube: Secondary | ICD-10-CM

## 2023-04-20 LAB — SURGICAL PATHOLOGY

## 2023-04-20 NOTE — Telephone Encounter (Signed)
 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 Will plan to send referral to GYN Onc for further discussion of potential management.   Otherwise patient reports she is feeling well. She took some milk of magnesia yesterday and successfully had a BM. She will be returning to the office tomorrow for voiding trial.   Marguerita Beards, MD

## 2023-04-20 NOTE — Telephone Encounter (Signed)
error 

## 2023-04-21 ENCOUNTER — Telehealth: Payer: Self-pay | Admitting: *Deleted

## 2023-04-21 ENCOUNTER — Ambulatory Visit (INDEPENDENT_AMBULATORY_CARE_PROVIDER_SITE_OTHER)

## 2023-04-21 DIAGNOSIS — Z48816 Encounter for surgical aftercare following surgery on the genitourinary system: Secondary | ICD-10-CM

## 2023-04-21 NOTE — Patient Instructions (Signed)
 Please keep your future post op appointment. Please call our office if you have any questions or concerns.

## 2023-04-21 NOTE — Progress Notes (Signed)
 Jacqueline Ortiz is a 78 y.o. female here for a voiding trial.  16 fr cath removed with no complications.Pt leaked urine on her way to the restroom  Instilled: 200 Voided: 190 PVR: 0

## 2023-04-21 NOTE — Telephone Encounter (Signed)
 Spoke with the patient regarding the referral to GYN oncology. Patient scheduled as new patient with Dr Alvester Morin on 4/14 at 11:15 am. Patient given an arrival time of 10:45 am.  Explained to the patient the the doctor will perform a pelvic exam at this visit. Patient given the policy that only one visitor allowed and that visitor must be over 16 yrs are allowed in the Cancer Center. Patient given the address/phone number for the clinic and that the center offers free valet service. Patient aware that masks required.

## 2023-05-07 ENCOUNTER — Encounter: Payer: Self-pay | Admitting: Psychiatry

## 2023-05-08 ENCOUNTER — Ambulatory Visit: Admitting: Gynecologic Oncology

## 2023-05-11 ENCOUNTER — Inpatient Hospital Stay

## 2023-05-11 ENCOUNTER — Inpatient Hospital Stay: Attending: Adult Health | Admitting: Psychiatry

## 2023-05-11 ENCOUNTER — Encounter: Payer: Self-pay | Admitting: Psychiatry

## 2023-05-11 VITALS — BP 128/82 | HR 84 | Temp 98.4°F | Resp 17 | Wt 123.2 lb

## 2023-05-11 DIAGNOSIS — Z853 Personal history of malignant neoplasm of breast: Secondary | ICD-10-CM | POA: Diagnosis not present

## 2023-05-11 DIAGNOSIS — M1990 Unspecified osteoarthritis, unspecified site: Secondary | ICD-10-CM | POA: Insufficient documentation

## 2023-05-11 DIAGNOSIS — C5702 Malignant neoplasm of left fallopian tube: Secondary | ICD-10-CM | POA: Diagnosis not present

## 2023-05-11 DIAGNOSIS — M810 Age-related osteoporosis without current pathological fracture: Secondary | ICD-10-CM | POA: Diagnosis not present

## 2023-05-11 DIAGNOSIS — Z90710 Acquired absence of both cervix and uterus: Secondary | ICD-10-CM | POA: Diagnosis not present

## 2023-05-11 DIAGNOSIS — Z9079 Acquired absence of other genital organ(s): Secondary | ICD-10-CM | POA: Diagnosis not present

## 2023-05-11 DIAGNOSIS — Z90722 Acquired absence of ovaries, bilateral: Secondary | ICD-10-CM | POA: Diagnosis not present

## 2023-05-11 DIAGNOSIS — F411 Generalized anxiety disorder: Secondary | ICD-10-CM | POA: Insufficient documentation

## 2023-05-11 DIAGNOSIS — I10 Essential (primary) hypertension: Secondary | ICD-10-CM | POA: Insufficient documentation

## 2023-05-11 DIAGNOSIS — E785 Hyperlipidemia, unspecified: Secondary | ICD-10-CM | POA: Diagnosis not present

## 2023-05-11 DIAGNOSIS — L309 Dermatitis, unspecified: Secondary | ICD-10-CM | POA: Diagnosis not present

## 2023-05-11 DIAGNOSIS — E039 Hypothyroidism, unspecified: Secondary | ICD-10-CM | POA: Insufficient documentation

## 2023-05-11 DIAGNOSIS — Z79899 Other long term (current) drug therapy: Secondary | ICD-10-CM | POA: Insufficient documentation

## 2023-05-11 LAB — CBC WITH DIFFERENTIAL (CANCER CENTER ONLY)
Abs Immature Granulocytes: 0.02 10*3/uL (ref 0.00–0.07)
Basophils Absolute: 0.1 10*3/uL (ref 0.0–0.1)
Basophils Relative: 2 %
Eosinophils Absolute: 0.2 10*3/uL (ref 0.0–0.5)
Eosinophils Relative: 3 %
HCT: 42.2 % (ref 36.0–46.0)
Hemoglobin: 13.3 g/dL (ref 12.0–15.0)
Immature Granulocytes: 0 %
Lymphocytes Relative: 19 %
Lymphs Abs: 0.9 10*3/uL (ref 0.7–4.0)
MCH: 28.7 pg (ref 26.0–34.0)
MCHC: 31.5 g/dL (ref 30.0–36.0)
MCV: 91.1 fL (ref 80.0–100.0)
Monocytes Absolute: 0.9 10*3/uL (ref 0.1–1.0)
Monocytes Relative: 19 %
Neutro Abs: 2.8 10*3/uL (ref 1.7–7.7)
Neutrophils Relative %: 57 %
Platelet Count: 214 10*3/uL (ref 150–400)
RBC: 4.63 MIL/uL (ref 3.87–5.11)
RDW: 15.9 % — ABNORMAL HIGH (ref 11.5–15.5)
WBC Count: 4.9 10*3/uL (ref 4.0–10.5)
nRBC: 0 % (ref 0.0–0.2)

## 2023-05-11 LAB — CMP (CANCER CENTER ONLY)
ALT: 15 U/L (ref 0–44)
AST: 22 U/L (ref 15–41)
Albumin: 4.2 g/dL (ref 3.5–5.0)
Alkaline Phosphatase: 84 U/L (ref 38–126)
Anion gap: 6 (ref 5–15)
BUN: 12 mg/dL (ref 8–23)
CO2: 31 mmol/L (ref 22–32)
Calcium: 9.9 mg/dL (ref 8.9–10.3)
Chloride: 104 mmol/L (ref 98–111)
Creatinine: 1.01 mg/dL — ABNORMAL HIGH (ref 0.44–1.00)
GFR, Estimated: 58 mL/min — ABNORMAL LOW (ref >60)
Glucose, Bld: 100 mg/dL — ABNORMAL HIGH (ref 70–99)
Potassium: 4.2 mmol/L (ref 3.5–5.1)
Sodium: 141 mmol/L (ref 135–145)
Total Bilirubin: 0.3 mg/dL (ref 0.0–1.2)
Total Protein: 7.4 g/dL (ref 6.5–8.1)

## 2023-05-11 NOTE — Patient Instructions (Signed)
 It was a pleasure to see you in clinic today. 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 If that looks good, we will go ahead and proceed with chemo - I have also placed orders for your port placement - We will get you an appointment with Dr. Marton Sleeper (medical oncologist) for chemotherapy  Thank you very much for allowing me to provide care for you today.  I appreciate your confidence in choosing our Gynecologic Oncology team at [facility].  If you have any questions about your visit today please call our office or send us  a MyChart message and we will get back to you as soon as possible.

## 2023-05-11 NOTE — Progress Notes (Signed)
 GYNECOLOGIC ONCOLOGY NEW PATIENT CONSULTATION  Date of Service: 05/11/2023 Referring Provider: [name], MD   ASSESSMENT AND PLAN: Jacqueline Ortiz is a 78 y.o. 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  Reviewed patient's diagnosis.  Reviewed typical surgical staging would include washings, lymphadenectomy, peritoneal biopsies, and omentectomy.  With high-grade serous carcinoma, would recommend chemotherapy at her current stage with no change in chemotherapy if surgical staging were to upstage her diagnosis.  Reviewed the pros and cons of surgical staging.  In this case, surgical staging would not change plan for chemotherapy but in theory could change recommendation for maintenance therapy if patient were to be HRD positive.  Patient has previously undergone germline genetic testing in the setting of her breast cancer and this was negative.  Patient expresses desire to avoid additional surgery . Recommend that patient undergo CT chest/abdomen/pelvis to rule out any obvious evidence of additional disease at this time.  If there is additional disease that would benefit from debulking, we could consider performing the surgery prior to chemotherapy.  Also recommend labs today including CA125.  If imaging and CA125 reassuring, reasonable to proceed with chemotherapy with carboplatin and paclitaxel.  Reviewed chemotherapy and side effects in brief today.  Will also place order for foundation testing to evaluate for HRD.  Referral placed to medical oncology for patient to see Dr. Marton Sleeper such.  Order also placed for port placement.  Patient has follow-up with her surgeon Dr. Frutoso Jing on 05/27/23.  Deferred pelvic exam to that time so she could be evaluated by her surgeon.   A copy of this note was sent to the patient's referring provider.  Derrel Flies, MD Gynecologic Oncology   Medical Decision Making I personally spent  TOTAL 45 minutes  face-to-face and non-face-to-face in the care of this patient, which includes all pre, intra, and post visit time on the date of service.   ------------  CC: Fallopian tube carcinoma  HISTORY OF PRESENT ILLNESS:  Jacqueline Ortiz is a 78 y.o. woman who is seen in consultation at the request of [name], MD for evaluation of new diagnosis of fallopian tube carcinoma.  Patient was following with urogynecology for anterior, posterior, and apical prolapse.  For treatment of this she underwent a robotic assisted total laparoscopic hysterectomy, bilateral salpingo-oophorectomy, sacrocolpopexy, perineorrhaphy, cystoscopy on 04/15/23.  On laparoscopy, her uterus, bilateral fallopian tubes, and ovaries appeared normal.  Final pathology showed high-grade serous carcinoma of the left fallopian tube measuring 0.8 cm in greatest dimension and involving serosal surface with also focal STIC.  Given the incidental finding, she was not surgically staged.  Patient also has a history of DCIS breast cancer diagnosed in 2023 treated with lumpectomy and sentinel lymph node biopsy followed by radiation.  She underwent genetic testing on 04/08/2021 which was negative.  Today patient presents with her husband.  Feels she Ringle from surgery.  Reports follow-up with Dr. Frutoso Jing in a few weeks.     TREATMENT HISTORY: Oncology History Overview Note   Cancer Staging  Ductal carcinoma in situ (DCIS) of right breast Staging form: Breast, AJCC 8th Edition - Clinical stage from 03/15/2021: Stage 0 (cTis (DCIS), cN0, cM0, G2, ER+, PR+, HER2: Not Assessed) - Signed by Sonja Wyndmoor, MD on 03/26/2021    Malignant neoplasm of overlapping sites of right breast in female, estrogen receptor positive (HCC)  03/05/2021 Mammogram   CLINICAL DATA:  Bilateral screening recall for a right breast distortion and possible masses and  a possible left breast mass.   EXAM: DIGITAL DIAGNOSTIC BILATERAL MAMMOGRAM WITH TOMOSYNTHESIS AND  CAD; ULTRASOUND LEFT BREAST LIMITED; ULTRASOUND RIGHT BREAST LIMITED  IMPRESSION: 1. There is a prominent distortion in the superior retroareolar right breast with a sonographic correlate at 12 o'clock. Cine images through this region demonstrates that the actual area of abnormality may be broader than the 1.5 cm ultrasound measurement.   2. There is an indeterminate 5 mm mass in the right breast at 3 o'clock.   3.  There is an indeterminate 5 mm mass in the right breast at 9:30.   4.  No evidence of bilateral axillary lymphadenopathy.   5.  The mass in the left breast corresponds with a benign cyst.   03/15/2021 Cancer Staging   Staging form: Breast, AJCC 8th Edition - Clinical stage from 03/15/2021: Stage 0 (cTis (DCIS), cN0, cM0, G2, ER+, PR+, HER2: Not Assessed) - Signed by Sonja Bowdle, MD on 03/26/2021 Stage prefix: Initial diagnosis Histologic grading system: 3 grade system   03/15/2021 Initial Biopsy   Diagnosis 1. Breast, right, needle core biopsy, 12 o'clock, 1cmfn, ribbon -DUCTAL CARCINOMA IN SITU, INTERMEDIATE NUCLEAR GRADE, SOLID TYPE WITHOUT NECROSIS -NEGATIVE FOR INVASIVE CARCINOMA -COMPLEX SCLEROSING LESION WITH MICROCALCIFICATIONS -PROLIFERATIVE FIBROCYSTIC CHANGES INCLUDING EPITHELIAL HYPERPLASIA WITHOUT ATYPIA 2. Breast, right, needle core biopsy, 3 o'clock, 5cmfn, coil -RADIAL SCAR/COMPLEX SCLEROSING LESION -NEGATIVE FOR MICROCALCIFICATIONS -NEGATIVE FOR CARCINOMA 3. Breast, right, needle core biopsy, 9:30 o'clock, 5cmfn, heart -HIGH-GRADE DUCTAL CARCINOMA IN SITU, SOLID TYPE WITHOUT NECROSIS INVOLVING A FIBROADENOMA -NEGATIVE FOR INVASIVE CARCINOMA -NEGATIVE FOR CALCIFICATIONS  1. PROGNOSTIC INDICATORS Results: Estrogen Receptor: 90%, POSITIVE, STRONG STAINING INTENSITY Progesterone Receptor: 40%, POSITIVE, STRONG STAINING INTENSITY  3. PROGNOSTIC INDICATORS Results: Estrogen Receptor: 100%, POSITIVE, STRONG STAINING INTENSITY Progesterone Receptor: 10%,  POSITIVE, STRONG STAINING INTENSITY   03/21/2021 Initial Diagnosis   Ductal carcinoma in situ (DCIS) of right breast    Genetic Testing   [facility] CancerNext Panel was Negative. Report date is 04/08/2021.  The CancerNext gene panel offered by [employer] includes sequencing, rearrangement analysis, and RNA analysis for the following 36 genes:   APC, ATM, AXIN2, BARD1, BMPR1A, BRCA1, BRCA2, BRIP1, CDH1, CDK4, CDKN2A, CHEK2, DICER1, HOXB13, EPCAM, GREM1, MLH1, MSH2, MSH3, MSH6, MUTYH, NBN, NF1, NTHL1, PALB2, PMS2, POLD1, POLE, PTEN, RAD51C, RAD51D, RECQL, SMAD4, SMARCA4, STK11, and TP53.    04/18/2021 Pathology Results   Diagnosis Breast, right, needle core biopsy, posterior central FOCAL INVASIVE MODERATELY DIFFERENTIATED DUCTAL CARCINOMA, GRADE 2 (3+2+1) EXTENSIVE HIGH-GRADE DUCTAL CARCINOMA IN SITU, SOLID TYPE WITH NECROSIS INVASIVE TUMOR MEASURES 1.5 MM COMPLEX SCLEROSING LESION AND FIBROCYSTIC CHANGES INCLUDING EPITHELIAL HYPERPLASIA WITHOUT ATYPIA  PROGNOSTIC INDICATORS Results: The tumor cells are EQUIVOCAL for Her2 (2+). Her2 by FISH will be performed and the results reported separately. Estrogen Receptor: 100%, POSITIVE, STRONG STAINING INTENSITY Progesterone Receptor: 90%, POSITIVE, MODERATE STAINING INTENSITY Proliferation Marker Ki67: 10%  FLUORESCENCE IN-SITU HYBRIDIZATION Results: GROUP 4: HER2 **NEGATIVE**   05/01/2021 Cancer Staging   Staging form: Breast, AJCC 8th Edition - Pathologic stage from 05/01/2021: Stage IA (pT1a, pN0, cM0, G2, ER+, PR+, HER2-) - Signed by Percival Brace, NP on 03/27/2022 Stage prefix: Initial diagnosis Histologic grading system: 3 grade system Residual tumor (R): R0 - None   05/01/2021 Definitive Surgery   FINAL MICROSCOPIC DIAGNOSIS:   A. BREAST, RIGHT MEDIAL, LUMPECTOMY:  A small focus of residual DCIS in the vicinity of healed biopsy site  with clear margins of resection.   B. BREAST, RIGHT LATERAL, LUMPECTOMY:  Residual foci of  intermediate grade DCIS with solid growth pattern  adjacent to organized blood clots with clear margins of resection.  Invasive carcinoma is not identified.  Please see the synoptic report for specimen E.   C. LYMPH NODE, RIGHT AXILLARY, SENTINEL, EXCISION:  A lymph node with fatty infiltration which is negative for metastatic  carcinoma.   D. LYMPH NODE, RIGHT AXILLARY, SENTINEL, EXCISION:  A lymph node with fatty infiltration which is negative for metastatic  carcinoma.   E. LYMPH NODE, RIGHT AXILLARY, SENTINEL, EXCISION:  A lymph node negative for metastatic carcinoma.    05/28/2021 - 06/26/2021 Radiation Therapy   Site Technique Total Dose (Gy) Dose per Fx (Gy) Completed Fx Beam Energies  Breast, Right: Breast_R 3D 42.56/42.56 2.66 16/16 6XFFF  Breast, Right: Breast_R_Bst 3D 8/8 2 4/4 6X, 10X       PAST MEDICAL HISTORY: Past Medical History:  Diagnosis Date   Complete uterovaginal prolapse    Diverticulosis of colon    Dry eyes, bilateral 2000   Duodenal diverticulum    Dyslipidemia 2008   Eczema 1993   GAD (generalized anxiety disorder)    Hemorrhoids 2003   internal and external- remains- treats as needed with topical gels   History of adenomatous polyp of colon    followed by  Dr. Loy Ruff   History of hiatal hernia    large HH  by EGD s/p  [facility] repair and nissen fundoplication 02-06-2016 by dr Melton Squires   History of kidney stones    1980s   Hyperlipidemia 2008   Hypertension 2021   Hypothyroidism    IBS (irritable bowel syndrome)    Malignant neoplasm of overlapping sites of right breast in female, estrogen receptor positive (HCC) 02/2021   oncologist--- dr Lee Public surgeon-- dr Afton Horse;   dx 02/ 2023;  Stage 1A3,  ER/PR+;  HG DCIS,  nodes negative;  04/ 05/ 2023  s/p  right lumpectomy w/ node dissecton's;  completed radiation 06-26-2021;  forego antiestrogen therapy   OA (osteoarthritis)    Osteoporosis 2003   Panic disorder    patient reports anxiety most of  her life   Prolapse of vaginal walls    anterior and posterior   S/P dilatation of esophageal stricture 2008   s/p   2008/  2010   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 Loy Ruff   CYSTOSCOPY N/A 04/15/2023   Procedure: CYSTOSCOPY;  Surgeon: Arma Lamp, MD;  Location[facility];  Service: Gynecology;  Laterality: N/A;   DILATION AND CURETTAGE OF UTERUS     yrs ago   ESOPHAGOGASTRODUODENOSCOPY N/A 10/21/2015   Procedure: ESOPHAGOGASTRODUODENOSCOPY (EGD);  Surgeon: Janel Medford, MD;  Location:  [facility];  Service: Endoscopy;  Laterality: N/A;   LARYNX SURGERY  1993   pt stated due to left vocal cord paralized (unknown cause) told "splints" placed   PATELLA FRACTURE SURGERY Right 1996   has retained hardware   PERINEOPLASTY N/A 04/15/2023   Procedure: PERINEOPLASTY;  Surgeon: Marguerita Beards, MD;  Location: [facility] OR;  Service: Gynecology;  Laterality: N/A;   ROBOTIC ASSISTED LAPAROSCOPIC NISSEN FUNDOPLICATION  02/06/2016   @[facility]  by dr Derrell Lolling;  ROBOTIC ASSISTED HIATAL HERNIA REPAIR WITH MESH & NISSEN FUNDOPLICATION/  GASTROSTOMY TUBE   ROBOTIC ASSISTED LAPAROSCOPIC SACROCOLPOPEXY N/A 04/15/2023    Procedure: SACROCOLPOPEXY, ROBOT-ASSISTED, LAPAROSCOPIC;  Surgeon: Marguerita Beards, MD;  Location: [facility] OR;  Service: Gynecology;  Laterality: N/A;   ROBOTIC ASSISTED TOTAL HYSTERECTOMY WITH BILATERAL SALPINGO OOPHERECTOMY N/A 04/15/2023   Procedure: ROBOTIC ASSISTED TOTAL HYSTERECTOMY WITH BILATERAL SALPINGO OOPHORECTOMY;  Surgeon: Marguerita Beards, MD;  Location: [facility] OR;  Service: Gynecology;  Laterality: N/A;  Total time requested is 3.5hrs   SENTINEL NODE BIOPSY Right 05/01/2021   Procedure: SENTINEL NODE BIOPSY;  Surgeon: Harriette Bouillon, MD;  Location: [facility] OR;  Service: General;  Laterality: Right;   TONSILLECTOMY  1953   age 29   TUBAL LIGATION Bilateral 1977    OB/GYN HISTORY: OB History  Gravida Para Term Preterm AB Living  2 1  1 1 1   SAB IAB Ectopic Multiple Live Births  1    1    # Outcome Date GA Lbr Len/2nd Weight Sex Type Anes PTL Lv  2 SAB           1 Preterm      CS-Unspec   LIV      Age at menarche: 58 Age at menopause: 63 Hx of HRT: no Hx of STI: no Last pap: unsure (aged out) History of abnormal pap smears: no  SCREENING STUDIES:  Last mammogram: 02/17/2023 Last colonoscopy: 2023  MEDICATIONS:  Current Outpatient Medications:    acetaminophen (TYLENOL) 500 MG tablet, Take 1 tablet (500 mg total) by mouth every 6 (six) hours as needed (pain)., Disp: 30 tablet, Rfl: 0   atorvastatin (LIPITOR) 20 MG tablet, Take 20 mg by mouth at bedtime., Disp: , Rfl:    Calcium-Magnesium-Vitamin D 500-50-100 MG-MG-UNIT CHEW, Chew 2 capsules by mouth at bedtime., Disp: , Rfl:    docusate sodium (COLACE) 100 MG capsule, Take 100 mg by mouth daily as needed for mild constipation., Disp: , Rfl:    Doxylamine-DM (ROBITUSSIN NIGHTTIME COUGH DM) 3.125-7.5 MG/5ML LIQD, Take 5-10 mLs by mouth at bedtime as needed (cough)., Disp: , Rfl:    escitalopram (LEXAPRO) 10 MG tablet, Take 10 mg by mouth at bedtime., Disp: , Rfl:    estradiol (ESTRACE) 0.1 MG/GM vaginal cream, PLACE 0.5  GRAMS VAGINALLY AT BEDTIME FOR 14 DAYS THEN USE TWICE WEEKLY (Patient taking differently: Place 1 Applicatorful vaginally 2 (two) times a week. BEDTIME), Disp: 42.5 g, Rfl: 0   ibuprofen (ADVIL) 200 MG tablet, Take 200 mg by mouth every 8 (eight) hours as needed (pain.)., Disp: , Rfl:    Ketotifen Fumarate (REFRESH EYE ITCH RELIEF OP), Place 1 drop into both eyes 4 (four) times daily as needed (eye irritation.)., Disp: , Rfl:    levothyroxine (SYNTHROID) 25 MCG tablet, Take 25 mcg by mouth daily before breakfast., Disp: , Rfl:    loratadine (CLARITIN) 10 MG tablet, Take 10 mg by mouth daily as needed for allergies or rhinitis., Disp: , Rfl:    magnesium hydroxide (  MILK OF MAGNESIA) 400 MG/5ML suspension, Take by mouth daily as needed for mild constipation., Disp: , Rfl:    phenylephrine (SUDAFED PE) 10 MG TABS tablet, Take 10 mg by mouth every 4 (four) hours as needed (sinus issues/congestion.)., Disp: , Rfl:    Phenylephrine-Witch Hazel (PREPARATION H) 0.25-50 % GEL, Apply 1 application  topically daily as needed (irritation/discomfort)., Disp: , Rfl:    sodium chloride (OCEAN) 0.65 % SOLN nasal spray, Place 1 spray into both nostrils as needed for congestion., Disp: , Rfl:    triamterene-hydrochlorothiazide (MAXZIDE-25) 37.5-25 MG tablet, Take 1 tablet by mouth in the morning., Disp: , Rfl:    ibuprofen (ADVIL) 600 MG tablet, Take 1 tablet (600 mg total) by mouth every 6 (six) hours as needed. (Patient not taking: Reported on 05/07/2023), Disp: 30 tablet, Rfl: 0  ALLERGIES: No Known Allergies  FAMILY HISTORY: Family History  Problem Relation Age of Onset   Heart disease Mother    Diabetes Mother    Melanoma Father    Heart disease Father    Arthritis Father    Heart disease Sister    Skin cancer Brother        patient unsure of what type of skin cancer brother had   Colon polyps Brother    Cancer Maternal Aunt 65       sinus cancer   Colon cancer Maternal Grandmother 65   Esophageal  cancer Neg Hx    Rectal cancer Neg Hx    Stomach cancer Neg Hx    Breast cancer Neg Hx    Prostate cancer Neg Hx    Pancreatic cancer Neg Hx    Ovarian cancer Neg Hx    Endometrial cancer Neg Hx     SOCIAL HISTORY: Social History   Socioeconomic History   Marital status: Married    Spouse name: Not on file   Number of children: 1   Years of education: Not on file   Highest education level: Not on file  Occupational History   Not on file  Tobacco Use   Smoking status: Never   Smokeless tobacco: Never  Vaping Use   Vaping status: Never Used  Substance and Sexual Activity   Alcohol use: No   Drug use: Never   Sexual activity: Yes    Birth control/protection: Post-menopausal  Other Topics Concern   Not on file  Social History Narrative   Not on file   Social Drivers of Health   Financial Resource Strain: Not on file  Food Insecurity: No Food Insecurity (05/07/2023)   Hunger Vital Sign    Worried About Running Out of Food in the Last Year: Never true    Ran Out of Food in the Last Year: Never true  Transportation Needs: No Transportation Needs (05/07/2023)   PRAPARE - Administrator, Civil Service (Medical): No    Lack of Transportation (Non-Medical): No  Physical Activity: Not on file  Stress: Not on file  Social Connections: Not on file  Intimate Partner Violence: Not At Risk (05/07/2023)   Humiliation, Afraid, Rape, and Kick questionnaire    Fear of Current or Ex-Partner: No    Emotionally Abused: No    Physically Abused: No    Sexually Abused: No    REVIEW OF SYSTEMS: New patient intake form was reviewed.  Complete 10-system review is negative except for the following: constipation, fatigue, anxiety  PHYSICAL EXAM: BP 128/82 (BP Location: Left Arm, Patient Position: Sitting)   Pulse 84  Temp 98.4 F (36.9 C) (Oral)   Resp 17   Wt 123 lb 3.2 oz (55.9 kg)   SpO2 98%   BMI 21.82 kg/m  Constitutional: No acute distress. Neuro/Psych: Alert,  oriented.  Head and Neck: Normocephalic, atraumatic. Neck symmetric without masses. Sclera anicteric.  Respiratory: Normal work of breathing. Clear to auscultation bilaterally. Cardiovascular: Regular rate and rhythm, no murmurs, rubs, or gallops. Abdomen: Normoactive bowel sounds. Soft, non-distended, non-tender to palpation. No masses appreciated. Well healing incisions. Extremities: Grossly normal range of motion. Warm, well perfused. No edema bilaterally. Skin: No rashes or lesions. Lymphatic: No cervical, supraclavicular, or inguinal adenopathy. Genitourinary: Deferred  LABORATORY AND RADIOLOGIC DATA: Outside medical records were reviewed to synthesize the above history, along with the history and physical obtained during the visit.  Outside laboratory and pathology reports were reviewed, with pertinent results below.   WBC  Date Value Ref Range Status  04/10/2023 7.7 4.0 - 10.5 K/uL Final   Hemoglobin  Date Value Ref Range Status  04/10/2023 12.8 12.0 - 15.0 g/dL Final  16/10/9602 54.0 12.0 - 15.0 g/dL Final   HCT  Date Value Ref Range Status  04/10/2023 40.1 36.0 - 46.0 % Final   Platelets  Date Value Ref Range Status  04/10/2023 289 150 - 400 K/uL Final   Platelet Count  Date Value Ref Range Status  03/27/2021 223 150 - 400 K/uL Final   Magnesium  Date Value Ref Range Status  05/27/2022 2.0 1.7 - 2.4 mg/dL Final    Comment:    Performed at [facility], [address], [place], [place] [address]   Creatinine  Date Value Ref Range Status  03/27/2021 0.94 0.44 - 1.00 mg/dL Final   Creatinine, Ser  Date Value Ref Range Status  04/13/2023 1.10 (H) 0.57 - 1.00 mg/dL Final   AST  Date Value Ref Range Status  05/25/2022 30 15 - 41 U/L Final  03/27/2021 24 15 - 41 U/L Final   ALT  Date Value Ref Range Status  05/25/2022 44 0 - 44 U/L Final  03/27/2021 22 0 - 44 U/L Final    Surgical pathology (04/15/23): FINAL MICROSCOPIC DIAGNOSIS:  A. 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 Tumor measures 0.8 cm in greatest dimension and involves the serosal surface (pT1c2) Focal serous tubal intraepithelial carcinoma (STIC) Adenomyosis Benign inactive endometrium with cystic change Acute chronic cervicitis with squamous metaplasia Benign right fallopian tube Benign ovaries  ONCOLOGY TABLE:  OVARY or FALLOPIAN TUBE or PRIMARY PERITONEUM: Resection  Procedure: Total hysterectomy and bilateral salpingo-oophorectomy Specimen Integrity: Intact Tumor Site: Left fallopian tube Tumor Size: 0.8 cm (estimated from glass slide) Histologic Type: Serous carcinoma Histologic Grade: High-grade Ovarian Surface Involvement: Not identified Fallopian Tube Surface Involvement: Present Implants: Not applicable Lymphatic and/or Vascular Invasion: Not identified Other Tissue/ Organ Involvement: Not applicable Largest Extrapelvic Peritoneal Focus: Not applicable Peritoneal/Ascitic Fluid Involvement: Not applicable Chemotherapy Response Score (CRS): Not applicable, no known presurgical therapy Regional Lymph Nodes: Not applicable (no lymph nodes submitted or found) Distant Metastasis: Not applicable] Pathologic Stage Classification (pTNM, AJCC 8th Edition): pT1c2, pN n/a Ancillary Studies: Available upon request. Representative Tumor Block: A6 Comment(s): The left fallopian tube shows and infiltrating tumor comprised of solid and cribriform sheets of large atypical cells exhibiting focal hobnail architecture.  Similar-appearing cells with appear to focally involve the epithelium lining adjacent plicae.  2 immunohistochemical stains are performed with adequate  control.  The neoplastic cells are positive for p16 and p53 supporting the above diagnosis.  The epithelium lining adjacent plicae exhibits patchy to focal confluent staining for p53  compatible with focal serous tubal intraepithelial carcinoma (STIC).  The tumor involves 2 sections estimated to be 0.4 cm in thickness thus 0.8 cm in greatest dimension.  Case is reviewed by Dr. Talmadge Fail who concurs with the interpretation.  Diagnosis conveyed to Dr. Frutoso Jing by Dr. Kavin Parsley via Epic messaging on 04/20/2023 at 10:33 AM. (v1.3.0.1)

## 2023-05-12 ENCOUNTER — Encounter: Payer: Self-pay | Admitting: Psychiatry

## 2023-05-12 ENCOUNTER — Encounter: Payer: Self-pay | Admitting: Oncology

## 2023-05-12 LAB — CA 125: Cancer Antigen (CA) 125: 17.3 U/mL (ref 0.0–38.1)

## 2023-05-12 NOTE — Progress Notes (Signed)
 Order requisition sent for [name] testing per Dr. Daisey Dryer on accession [phone] [id number].

## 2023-05-13 ENCOUNTER — Telehealth: Payer: Self-pay | Admitting: Oncology

## 2023-05-13 NOTE — Telephone Encounter (Signed)
 Andrew Banister and scheduled a new patient appointment with Dr. Marton Sleeper on 05/19/23 at 3:00 with arrival to the cancer center to check in at 2:30.  She verbalized understanding and agreement.

## 2023-05-18 ENCOUNTER — Telehealth: Payer: Self-pay | Admitting: Oncology

## 2023-05-18 ENCOUNTER — Other Ambulatory Visit: Payer: Self-pay | Admitting: Oncology

## 2023-05-18 ENCOUNTER — Encounter (HOSPITAL_COMMUNITY): Payer: Self-pay | Admitting: Psychiatry

## 2023-05-18 NOTE — Telephone Encounter (Signed)
 Called Jacqueline Ortiz and rescheduled her new patient appointment with Dr. Marton Sleeper to 05/26/23 at 2:00 with 1:30 arrival to the cancer center to check in.  She verbalized understanding and agreement of the new appointment date and time.

## 2023-05-18 NOTE — Progress Notes (Signed)
 Gynecologic Oncology Multi-Disciplinary Disposition Conference Note  Date of the Conference: 05/18/2023  Patient Name: Jacqueline Ortiz  Referring Provider: Dr. Frutoso Jing Primary GYN Oncologist: Dr. 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 Disposition is to CT imaging followed by chemotherapy or surgery depending on if there is additional disease.   This Multidisciplinary conference took place involving physicians from Gynecologic Oncology, Medical Oncology, Radiation Oncology, Pathology, Radiology along with the Gynecologic Oncology Nurse Practitioner and Gynecologic Oncology Nurse Navigator.  Comprehensive assessment of the patient's malignancy, staging, need for surgery, chemotherapy, radiation therapy, and need for further testing were reviewed. Supportive measures, both inpatient and following discharge were also discussed. The recommended plan of care is documented. Greater than 35 minutes were spent correlating and coordinating this patient's care.

## 2023-05-19 ENCOUNTER — Ambulatory Visit: Admitting: Hematology and Oncology

## 2023-05-20 ENCOUNTER — Other Ambulatory Visit: Payer: Self-pay | Admitting: Physician Assistant

## 2023-05-20 DIAGNOSIS — Z01818 Encounter for other preprocedural examination: Secondary | ICD-10-CM

## 2023-05-20 NOTE — H&P (Signed)
 Chief Complaint: Patient was seen in consultation today for cancer of left fallopian tube, with consideration for Port-A-Cath placement.  Referring Provider(s): Dr. Derrel Flies, MD   Supervising Physician: Art Largo  Patient Status: [facility] - Out-pt  Patient is Full Code  History of Present Illness: Jacqueline Ortiz is a 78 y.o. 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 Other PMHx as listed below.  Per Dr. Ona Bidding progress note dated 4/14: "Reviewed patient's diagnosis.  Reviewed typical surgical staging would include washings, lymphadenectomy, peritoneal biopsies, and omentectomy.  With high-grade serous carcinoma, would recommend chemotherapy at her current stage with no change in chemotherapy if surgical staging were to upstage her diagnosis.  Reviewed the pros and cons of surgical staging.  In this case, surgical staging would not change plan for chemotherapy but in theory could change recommendation for maintenance therapy if patient were to be HRD positive.  Patient has previously undergone germline genetic testing in the setting of her breast cancer and this was negative.  Patient expresses desire to avoid additional surgery . Recommend that patient undergo CT chest/abdomen/pelvis to rule out any obvious evidence of additional disease at this time.  If there is additional disease that would benefit from debulking, we could consider performing the surgery prior to chemotherapy.  Also recommend labs today including CA125.  If imaging and CA125 reassuring, reasonable to proceed with chemotherapy with carboplatin and paclitaxel.  Reviewed chemotherapy and side effects in brief today.   Will also place order for foundation testing to evaluate for HRD.   Referral placed to medical oncology for patient to see Dr. Marton Sleeper such.  Order also placed for port placement."  Interventional Radiology was requested for Port-A-Cath  placement. Patient is scheduled for same in IR today.   Patient is currently without any significant complaints. Patient is alert and laying in bed, calm. Patient denies any fevers, headache, chest pain, SOB, cough, abdominal pain, nausea, vomiting or bleeding.     Past Medical History:  Diagnosis Date   Complete uterovaginal prolapse    Diverticulosis of colon    Dry eyes, bilateral 2000   Duodenal diverticulum    Dyslipidemia 2008   Eczema 1993   GAD (generalized anxiety disorder)    Hemorrhoids 2003   internal and external- remains- treats as needed with topical gels   History of adenomatous polyp of colon    followed by  Dr. Loy Ruff   History of hiatal hernia    large HH  by EGD s/p  [facility] repair and nissen fundoplication 02-06-2016 by dr Melton Squires   History of kidney stones    1980s   Hyperlipidemia 2008   Hypertension 2021   Hypothyroidism    IBS (irritable bowel syndrome)    Malignant neoplasm of overlapping sites of right breast in female, estrogen receptor positive (HCC) 02/2021   oncologist--- dr Lee Public surgeon-- dr Afton Horse;   dx 02/ 2023;  Stage 1A3,  ER/PR+;  HG DCIS,  nodes negative;  04/ 05/ 2023  s/p  right lumpectomy w/ node dissecton's;  completed radiation 06-26-2021;  forego antiestrogen therapy   OA (osteoarthritis)    Osteoporosis 2003   Panic disorder    patient reports anxiety most of her life   Prolapse of vaginal walls    anterior and posterior   S/P dilatation of esophageal stricture 2008   s/p   2008/  2010   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 Loy Ruff   CYSTOSCOPY N/A 04/15/2023   Procedure: CYSTOSCOPY;  Surgeon: Arma Lamp, MD;  [facility]: [facility] OR;  Service: Gynecology;  Laterality: N/A;   DILATION AND CURETTAGE OF UTERUS     yrs ago   ESOPHAGOGASTRODUODENOSCOPY N/A 10/21/2015   Procedure: ESOPHAGOGASTRODUODENOSCOPY (EGD);  Surgeon: Janel Medford, MD;  Location: [facility];  Service: Endoscopy;  Laterality: N/A;   LARYNX SURGERY  1993   pt stated due to left vocal cord paralized (unknown cause) told "splints" placed   PATELLA FRACTURE SURGERY Right 1996   has retained hardware   PERINEOPLASTY N/A 04/15/2023   Procedure: PERINEOPLASTY;  Surgeon: Arma Lamp, MD;  Location: [facility] OR;  Service: Gynecology;  Laterality: N/A;   ROBOTIC ASSISTED LAPAROSCOPIC NISSEN FUNDOPLICATION  02/06/2016   @[facility]  by dr Melton Squires;  ROBOTIC ASSISTED HIATAL HERNIA REPAIR WITH MESH & NISSEN FUNDOPLICATION/  GASTROSTOMY TUBE   ROBOTIC ASSISTED LAPAROSCOPIC SACROCOLPOPEXY N/A 04/15/2023   Procedure: SACROCOLPOPEXY, ROBOT-ASSISTED, LAPAROSCOPIC;  Surgeon: Arma Lamp, MD;  Location[facility] OR;  Service: Gynecology;  Laterality: N/A;   ROBOTIC ASSISTED TOTAL HYSTERECTOMY WITH BILATERAL SALPINGO OOPHERECTOMY N/A 04/15/2023   Procedure: ROBOTIC ASSISTED TOTAL HYSTERECTOMY WITH BILATERAL SALPINGO OOPHORECTOMY;  Surgeon: Arma Lamp, MD;  Location: [facility] OR;  Service: Gynecology;  Laterality: N/A;  Total time requested is 3.5hrs   SENTINEL NODE BIOPSY Right 05/01/2021   Procedure: SENTINEL NODE BIOPSY;  Surgeon: Sim Dryer, MD;  Location: [facility] OR;  Service: General;   Laterality: Right;   TONSILLECTOMY  1953   age 78   TUBAL LIGATION Bilateral 1977    Allergies: Patient has no known allergies.  Medications: Prior to Admission medications   Medication Sig Start Date End Date Taking? Authorizing Provider  acetaminophen  (TYLENOL ) 500 MG tablet Take 1 tablet (500 mg total) by mouth every 6 (six) hours as needed (pain). 04/06/23   Zuleta, Kaitlin G, NP  atorvastatin  (LIPITOR) 20 MG tablet Take 20 mg by mouth at bedtime. 09/05/15   [provider]  Calcium -Magnesium -Vitamin D 500-50-100 MG-MG-UNIT CHEW Chew 2 capsules by mouth at bedtime.    [provider]  docusate sodium (COLACE) 100 MG capsule Take 100 mg by mouth daily as needed for mild constipation.    [provider]  Doxylamine-DM (ROBITUSSIN NIGHTTIME COUGH DM) 3.125-7.5 MG/5ML LIQD Take 5-10 mLs by mouth at bedtime as needed (cough).    [provider]  escitalopram (LEXAPRO) 10 MG tablet Take 10 mg by mouth at bedtime. 11/17/22   [provider]  estradiol  (ESTRACE ) 0.1 MG/GM vaginal cream PLACE 0.5 GRAMS VAGINALLY AT BEDTIME FOR 14 DAYS THEN USE TWICE WEEKLY Patient taking differently: Place 1 Applicatorful vaginally  2 (two) times a week. BEDTIME 02/06/23   Zuleta, Kaitlin G, NP  ibuprofen  (ADVIL ) 200 MG tablet Take 200 mg by mouth every 8 (eight) hours as needed (pain.).    [provider]  ibuprofen  (ADVIL ) 600 MG tablet Take 1 tablet (600 mg total) by mouth every 6 (six) hours as needed. Patient not taking: Reported on 05/07/2023 04/06/23   Zuleta, Kaitlin G, NP  Ketotifen Fumarate (REFRESH EYE ITCH RELIEF OP) Place 1 drop into both eyes 4 (four) times daily as needed (eye irritation.).    [provider]  levothyroxine (SYNTHROID) 25 MCG tablet Take 25 mcg by mouth daily before breakfast.    [provider]  loratadine (CLARITIN) 10 MG tablet Take 10 mg by mouth daily as needed for allergies or rhinitis.    [provider]  magnesium  hydroxide (MILK OF MAGNESIA) 400 MG/5ML suspension Take by mouth daily as needed for mild constipation.    [provider]  phenylephrine  (SUDAFED PE) 10 MG TABS tablet Take 10 mg by mouth every 4 (four) hours as needed (sinus issues/congestion.).    [provider]  Phenylephrine -Witch Hazel (PREPARATION H) 0.25-50 % GEL Apply 1 application  topically daily as needed (irritation/discomfort).    [provider]  sodium chloride  (OCEAN) 0.65 % SOLN nasal spray Place 1 spray into both nostrils as needed for congestion.    [provider]  triamterene -hydrochlorothiazide  (MAXZIDE -25) 37.5-25 MG tablet Take 1 tablet by mouth in the morning. 11/08/22   [provider]     Family History  Problem Relation Age of Onset   Heart disease Mother    Diabetes Mother    Melanoma Father    Heart disease Father    Arthritis Father    Heart disease Sister    Skin cancer Brother        patient unsure of what type of skin cancer brother had   Colon polyps Brother    Cancer Maternal Aunt 65       sinus cancer   Colon cancer Maternal Grandmother 26   Esophageal cancer Neg Hx    Rectal cancer Neg Hx    Stomach cancer Neg Hx    Breast cancer Neg Hx    Prostate cancer Neg Hx    Pancreatic cancer Neg Hx    Ovarian cancer Neg Hx    Endometrial cancer Neg Hx     Social History   Socioeconomic History   Marital status: Married    Spouse name: Not on file   Number of children: 1   Years of education: Not on file   Highest education level: Not on file  Occupational History   Not on file  Tobacco Use   Smoking status: Never   Smokeless tobacco: Never  Vaping Use   Vaping status: Never Used  Substance and Sexual Activity   Alcohol use: No   Drug use: Never   Sexual activity: Yes    Birth control/protection: Post-menopausal  Other Topics Concern   Not on file  Social History Narrative   Not on file   Social Drivers of Health    Financial Resource Strain: Not on file  Food Insecurity: No Food Insecurity (05/07/2023)   Hunger Vital Sign    Worried About Running Out of Food in the Last Year: Never true    Ran Out of Food in the Last Year: Never true  Transportation Needs: No Transportation Needs (05/07/2023)   PRAPARE - Transportation  Lack of Transportation (Medical): No    Lack of Transportation (Non-Medical): No  Physical Activity: Not on file  Stress: Not on file  Social Connections: Not on file     Review of Systems: A 12 point ROS discussed and pertinent positives are indicated in the HPI above.  All other systems are negative.  Vital Signs: BP 134/82   Pulse 88   Temp 97.7 F (36.5 C) (Oral)   Resp 18   Ht 5\' 3"  (1.6 m)   Wt 125 lb (56.7 kg)   SpO2 95%   BMI 22.14 kg/m   Advance Care Plan: The advanced care place/surrogate decision maker was discussed at the time of visit and the patient did not wish to discuss or was not able to name a surrogate decision maker or provide an advance care plan.  Physical Exam Constitutional:      General: She is not in acute distress.    Appearance: Normal appearance.  HENT:     Mouth/Throat:     Mouth: Mucous membranes are dry.  Cardiovascular:     Rate and Rhythm: Normal rate and regular rhythm.     Heart sounds: No murmur heard. Pulmonary:     Effort: Pulmonary effort is normal.     Breath sounds: Normal breath sounds. No wheezing.  Musculoskeletal:        General: Normal range of motion.     Cervical back: Normal range of motion.  Skin:    General: Skin is warm and dry.  Neurological:     Mental Status: She is alert and oriented to person, place, and time.  Psychiatric:        Mood and Affect: Mood normal.        Behavior: Behavior normal.        Thought Content: Thought content normal.        Judgment: Judgment normal.     Imaging: No results found.  Labs:  CBC: Recent Labs    05/26/22 0123 05/27/22 0420 04/10/23 1125  05/11/23 1239  WBC 13.6* 9.9 7.7 4.9  HGB 12.5 12.3 12.8 13.3  HCT 35.7* 37.3 40.1 42.2  PLT 295 280 289 214    COAGS: Recent Labs    05/25/22 1318  INR 1.2    BMP: Recent Labs    05/26/22 0123 05/27/22 0420 04/10/23 1125 04/13/23 1133 05/11/23 1239  NA 134* 133* 137 139 141  K 2.9* 3.7 3.4* 3.6 4.2  CL 103 104 94* 97 104  CO2 22 24 31 27 31   GLUCOSE 137* 99 110* 95 100*  BUN 7* 6* 12 11 12   CALCIUM  8.3* 8.6* 10.5* 9.7 9.9  CREATININE 0.80 0.78 1.14* 1.10* 1.01*  GFRNONAA >60 >60 50*  --  58*    LIVER FUNCTION TESTS: Recent Labs    05/25/22 1318 05/11/23 1239  BILITOT 0.9 0.3  AST 30 22  ALT 44 15  ALKPHOS 91 84  PROT 7.6 7.4  ALBUMIN  3.0* 4.2    TUMOR MARKERS: No results for input(s): "AFPTM", "CEA", "CA199", "CHROMGRNA" in the last 8760 hours.  Assessment and Plan: Per Dr. Ona Bidding progress note dated 4/14: "With high-grade serous carcinoma, would recommend chemotherapy at her current stage with no change in chemotherapy if surgical staging were to upstage her diagnosis. [...] Patient expresses desire to avoid additional surgery   [...] Referral placed to medical oncology for patient to see Dr. Marton Sleeper such.  Order also placed for port placement."  All labs and medications are  within acceptable parameters. No pertinent allergies. Patient has been NPO since midnight.   Patient presents for scheduled Port-A-Cath placement in IR today.  Risks and benefits of image guided port-a-catheter placement was discussed with the patient including, but not limited to bleeding, infection, pneumothorax, or fibrin sheath development and need for additional procedures.  All of the patient's questions were answered, patient is agreeable to proceed.  Consent signed and in chart.      Thank you for allowing our service to participate in AJANE NOVELLA 's care.  Electronically Signed: Lovena Rubinstein, PA-C   05/21/2023, 1:41 PM      I spent a total of 40  Minutes  in face to face in clinical consultation, greater than 50% of which was counseling/coordinating care for cancer of left fallopian tube, with consideration for Port-A-Cath placement.

## 2023-05-21 ENCOUNTER — Encounter (HOSPITAL_COMMUNITY): Payer: Self-pay | Admitting: *Deleted

## 2023-05-21 ENCOUNTER — Encounter: Payer: Self-pay | Admitting: Hematology and Oncology

## 2023-05-21 ENCOUNTER — Ambulatory Visit (HOSPITAL_COMMUNITY)
Admission: RE | Admit: 2023-05-21 | Discharge: 2023-05-21 | Disposition: A | Discharge status: Home / Self Care | Source: Ambulatory Visit | Attending: Psychiatry | Admitting: Psychiatry

## 2023-05-21 ENCOUNTER — Other Ambulatory Visit: Payer: Self-pay

## 2023-05-21 ENCOUNTER — Ambulatory Visit (HOSPITAL_COMMUNITY)
Admission: RE | Admit: 2023-05-21 | Discharge: 2023-05-21 | Discharge status: Home / Self Care | Source: Ambulatory Visit | Attending: Psychiatry | Admitting: Psychiatry

## 2023-05-21 DIAGNOSIS — Z01818 Encounter for other preprocedural examination: Secondary | ICD-10-CM

## 2023-05-21 DIAGNOSIS — C5702 Malignant neoplasm of left fallopian tube: Secondary | ICD-10-CM

## 2023-05-21 MED ORDER — LIDOCAINE-EPINEPHRINE 1 %-1:100000 IJ SOLN
20.0000 mL | Freq: Once | INTRAMUSCULAR | Status: AC
  Administered 2023-05-21: 20 mL via INTRADERMAL

## 2023-05-21 MED ORDER — HEPARIN SOD (PORK) LOCK FLUSH 100 UNIT/ML IV SOLN
500.0000 [IU] | Freq: Once | INTRAVENOUS | Status: AC
  Administered 2023-05-21: 500 [IU] via INTRAVENOUS

## 2023-05-21 MED ORDER — SODIUM CHLORIDE 0.9% FLUSH: 3.0000 mL | INTRAVENOUS | Status: DC | PRN

## 2023-05-21 MED ORDER — MIDAZOLAM HCL 2 MG/2ML IJ SOLN
INTRAMUSCULAR | Status: AC | PRN
  Administered 2023-05-21: 1 mg via INTRAVENOUS

## 2023-05-21 MED ORDER — MIDAZOLAM HCL 2 MG/2ML IJ SOLN
INTRAMUSCULAR | Status: AC | PRN
  Administered 2023-05-21: .5 mg via INTRAVENOUS

## 2023-05-21 MED ORDER — SODIUM CHLORIDE 0.9 % IV SOLN: INTRAVENOUS | Status: DC

## 2023-05-21 MED ORDER — MIDAZOLAM HCL 2 MG/2ML IJ SOLN
INTRAMUSCULAR | Status: AC
  Filled 2023-05-21: qty 2

## 2023-05-21 MED ORDER — FENTANYL CITRATE (PF) 100 MCG/2ML IJ SOLN
INTRAMUSCULAR | Status: AC | PRN
  Administered 2023-05-21: 50 ug via INTRAVENOUS

## 2023-05-21 MED ORDER — LIDOCAINE-EPINEPHRINE 1 %-1:100000 IJ SOLN
INTRAMUSCULAR | Status: AC
  Filled 2023-05-21: qty 1

## 2023-05-21 MED ORDER — SODIUM CHLORIDE 0.9% FLUSH: 3.0000 mL | Freq: Two times a day (BID) | INTRAVENOUS | Status: DC

## 2023-05-21 MED ORDER — FENTANYL CITRATE (PF) 100 MCG/2ML IJ SOLN
INTRAMUSCULAR | Status: AC
  Filled 2023-05-21: qty 2

## 2023-05-21 MED ORDER — HEPARIN SOD (PORK) LOCK FLUSH 100 UNIT/ML IV SOLN
INTRAVENOUS | Status: AC
  Filled 2023-05-21: qty 5

## 2023-05-21 NOTE — Procedures (Signed)
 Vascular and Interventional Radiology Procedure Note  Patient: Jacqueline Ortiz DOB: Nov 15, 1945 Medical Record Number: [id number] Note Date/Time: 05/21/23 2:36 PM   Performing Physician: Art Largo, MD Assistant(s): None  Diagnosis:  GYN CA  Procedure: PORT PLACEMENT  Anesthesia: Conscious Sedation Complications: None Estimated Blood Loss: Minimal  Findings:  Successful right-sided port placement, with the tip of the catheter in the proximal right atrium.  Plan: Catheter ready for use.  See detailed procedure note with images in PACS. The patient tolerated the procedure well without incident or complication and was returned to Recovery in stable condition.    Art Largo, MD [facility]   Pager. [phone] Clinic. [phone]

## 2023-05-21 NOTE — Discharge Instructions (Addendum)
 Implanted Port Insertion, Care After  The following information offers guidance on how to care for yourself after your procedure. Your health care provider may also give you more specific instructions. If you have problems or questions, contact your health care provider.  What can I expect after the procedure? After the procedure, it is common to have: Discomfort at the port insertion site. Bruising on the skin over the port. This should improve over 3-4 days.   Urgent needs - Interventional Radiology, clinic [phone] (mon-fri 8-5).   Wound - May remove dressing and shower in 24 to 48 hours.  Keep site clean and dry.  Replace with bandaid as needed.  Do not submerge in tub or water until site healing well. If closed with glue, glue will flake off on its own.   If ordered by your provider, may start Emla cream (or any other creams ointments or lotions) in 2 weeks or after incision is healed. Port is ready for use immediately.   After completion of treatment, your provider should have you set up for monthly port flushes.   Follow these instructions at home: [facility] care After your port is placed, you will get a manufacturer's information card. The card has information about your port. Keep this card with you at all times. Take care of the port as told by your health care provider. Ask your health care provider if you or a family member can get training for taking care of the port at home. A home health care nurse will be be available to help care for the port. Make sure to remember what type of port you have. Incision care     Follow instructions from your health care provider about how to take care of your port insertion site. Make sure you: Wash your hands with soap and water for at least 20 seconds before and after you change your bandage (dressing). If soap and water are not available, use hand sanitizer. Change your dressing as told by your health care provider. Leave stitches  (sutures), skin glue, or adhesive strips in place. These skin closures may need to stay in place for 2 weeks or longer. If adhesive strip edges start to loosen and curl up, you may trim the loose edges. Do not remove adhesive strips completely unless your health care provider tells you to do that. Check your port insertion site every day for signs of infection. Check for: Redness, swelling, or pain. Fluid or blood. Warmth. Pus or a bad smell. Activity Return to your normal activities as told by your health care provider. Ask your health care provider what activities are safe for you. You may have to avoid lifting. Ask your health care provider how much you can safely lift. General instructions Take over-the-counter and prescription medicines only as told by your health care provider. Do not take baths, swim, or use a hot tub until your health care provider approves. Ask your health care provider if you may take showers. You may only be allowed to take sponge baths. If you were given a sedative during the procedure, it can affect you for several hours. Do not drive or operate machinery until your health care provider says that it is safe. Wear a medical alert bracelet in case of an emergency. This will tell any health care providers that you have a port. Keep all follow-up visits. This is important. Contact a health care provider if: You cannot flush your port with saline as directed, or you cannot  draw blood from the port. You have a fever or chills. You have redness, swelling, or pain around your port insertion site. You have fluid or blood coming from your port insertion site. Your port insertion site feels warm to the touch. You have pus or a bad smell coming from the port insertion site. Get help right away if: You have chest pain or shortness of breath. You have bleeding from your port that you cannot control. These symptoms may be an emergency. Get help right away. Call 911. Do not  wait to see if the symptoms will go away. Do not drive yourself to the hospital. Summary Take care of the port as told by your health care provider. Keep the manufacturer's information card with you at all times. Change your dressing as told by your health care provider. Contact a health care provider if you have a fever or chills or if you have redness, swelling, or pain around your port insertion site. Keep all follow-up visits. This information is not intended to replace advice given to you by your health care provider. Make sure you discuss any questions you have with your health care provider. Document Revised: 07/17/2020 Document Reviewed: 07/17/2020 Elsevier Patient Education  2023 Elsevier Inc.    Moderate Conscious Sedation  Adult  Care After (English)  After the procedure, it is common to have: Sleepiness for a few hours. Impaired judgment for a few hours. Trouble with balance. Nausea or vomiting if you eat too soon. Follow these instructions at home: For the time period you were told by your health care provider:  Rest. Do not participate in activities where you could fall or become injured. Do not drive or use machinery. Do not drink alcohol. Do not take sleeping pills or medicines that cause drowsiness. Do not make important decisions or sign legal documents. Do not take care of children on your own. Eating and drinking Follow instructions from your health care provider about what you may eat and drink. Drink enough fluid to keep your urine pale yellow. If you vomit: Drink clear fluids slowly and in small amounts as you are able. Clear fluids include water, ice chips, low-calorie sports drinks, and fruit juice that has water added to it (diluted fruit juice). Eat light and bland foods in small amounts as you are able. These foods include bananas, applesauce, rice, lean meats, toast, and crackers. General instructions Take over-the-counter and prescription medicines  only as told by your health care provider. Have a responsible adult stay with you for the time you are told. Do not use any products that contain nicotine or tobacco. These products include cigarettes, chewing tobacco, and vaping devices, such as e-cigarettes. If you need help quitting, ask your health care provider. Return to your normal activities as told by your health care provider. Ask your health care provider what activities are safe for you. Your health care provider may give you more instructions. Make sure you know what you can and cannot do. Contact a health care provider if: You are still sleepy or having trouble with balance after 24 hours. You feel light-headed. You vomit every time you eat or drink. You get a rash. You have a fever. You have redness or swelling around the IV site. Get help right away if: You have trouble breathing. You start to feel confused at home. These symptoms may be an emergency. Get help right away. Call 911. Do not wait to see if the symptoms will go away. Do not  drive yourself to the hospital. This information is not intended to replace advice given to you by your health care provider. Make sure you discuss any questions you have with your health care provider.

## 2023-05-25 ENCOUNTER — Ambulatory Visit (HOSPITAL_COMMUNITY)
Admission: RE | Admit: 2023-05-25 | Discharge: 2023-05-25 | Disposition: A | Discharge status: Home / Self Care | Source: Ambulatory Visit | Attending: Psychiatry | Admitting: Psychiatry

## 2023-05-25 DIAGNOSIS — C5702 Malignant neoplasm of left fallopian tube: Secondary | ICD-10-CM

## 2023-05-25 MED ORDER — IOHEXOL 300 MG/ML  SOLN
100.0000 mL | Freq: Once | INTRAMUSCULAR | Status: AC | PRN
  Administered 2023-05-25: 100 mL via INTRAVENOUS

## 2023-05-26 ENCOUNTER — Encounter: Payer: Self-pay | Admitting: Psychiatry

## 2023-05-26 ENCOUNTER — Encounter: Payer: Self-pay | Admitting: Hematology and Oncology

## 2023-05-26 ENCOUNTER — Encounter (HOSPITAL_COMMUNITY): Payer: Self-pay | Admitting: Psychiatry

## 2023-05-26 ENCOUNTER — Inpatient Hospital Stay: Admitting: Hematology and Oncology

## 2023-05-26 VITALS — BP 146/77 | HR 98 | Temp 98.4°F | Resp 17 | Wt 125.2 lb

## 2023-05-26 DIAGNOSIS — Z170 Estrogen receptor positive status [ER+]: Secondary | ICD-10-CM | POA: Diagnosis not present

## 2023-05-26 DIAGNOSIS — C5702 Malignant neoplasm of left fallopian tube: Secondary | ICD-10-CM

## 2023-05-26 DIAGNOSIS — C50811 Malignant neoplasm of overlapping sites of right female breast: Secondary | ICD-10-CM | POA: Diagnosis not present

## 2023-05-26 DIAGNOSIS — K5909 Other constipation: Secondary | ICD-10-CM | POA: Diagnosis not present

## 2023-05-26 MED ORDER — LIDOCAINE-PRILOCAINE 2.5-2.5 % EX CREA: TOPICAL_CREAM | CUTANEOUS | 3 refills | Status: DC

## 2023-05-26 MED ORDER — DEXAMETHASONE 4 MG PO TABS: ORAL_TABLET | ORAL | 6 refills | Status: DC

## 2023-05-26 MED ORDER — ONDANSETRON HCL 8 MG PO TABS: 8.0000 mg | ORAL_TABLET | Freq: Three times a day (TID) | ORAL | 1 refills | Status: DC | PRN

## 2023-05-26 MED ORDER — PROCHLORPERAZINE MALEATE 10 MG PO TABS: 10.0000 mg | ORAL_TABLET | Freq: Four times a day (QID) | ORAL | 1 refills | Status: DC | PRN

## 2023-05-26 NOTE — Progress Notes (Signed)
 START ON PATHWAY REGIMEN - Ovarian     A cycle is every 21 days:     Paclitaxel      Carboplatin   **Always confirm dose/schedule in your pharmacy ordering system**  Patient Characteristics: Postoperative without Neoadjuvant Therapy (Pathologic Staging), Newly Diagnosed, Adjuvant Therapy, Stage IIB and Stage IIIA/B/C Optimal Cytoreduction BRCA Mutation Status: Absent Therapeutic Status: Postoperative without Neoadjuvant Therapy (Pathologic Staging) AJCC 8 Stage Grouping: II AJCC M Category: cM0 AJCC T Category: pT2 AJCC N Category: pN0 Intent of Therapy: Curative Intent, Discussed with Patient

## 2023-05-26 NOTE — Assessment & Plan Note (Signed)
 We discussed importance of aggressive laxative therapy

## 2023-05-26 NOTE — Assessment & Plan Note (Signed)
 She will continue surveillance mammogram

## 2023-05-26 NOTE — Assessment & Plan Note (Signed)
 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 I reviewed CT imaging that was done yesterday which showed no evidence of residual disease Tumor marker was not helpful Final pathology high-grade serous, p53 mutated  We reviewed the NCCN guidelines We discussed the role of chemotherapy. The intent is of curative intent.  We discussed some of the risks, benefits, side-effects of carboplatin & Taxol. Treatment is intravenous, every 3 weeks x 6 cycles  Some of the short term side-effects included, though not limited to, including weight loss, life threatening infections, risk of allergic reactions, need for transfusions of blood products, nausea, vomiting, change in bowel habits, loss of hair, admission to hospital for various reasons, and risks of death.   Long term side-effects are also discussed including risks of infertility, permanent damage to nerve function, hearing loss, chronic fatigue, kidney damage with possibility needing hemodialysis, and rare secondary malignancy including bone marrow disorders.  The patient is aware that the response rates discussed earlier is not guaranteed.  After a long discussion, patient made an informed decision to proceed with the prescribed plan of care.   Patient education material was dispensed. We discussed premedication with dexamethasone  before chemotherapy. I recommend chemo education class, blood work and for her to start treatment next week

## 2023-05-26 NOTE — Progress Notes (Signed)
 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 Summary of her history is as follows: Oncology History Overview Note   Cancer Staging  Ductal carcinoma in situ (DCIS) of right breast Staging form: Breast, AJCC 8th Edition - Clinical stage from 03/15/2021: Stage 0 (cTis (DCIS), cN0, cM0, G2, ER+, PR+, HER2: Not Assessed) - Signed by Sonja Rockport, MD on 03/26/2021 High grade serous, p53 mutated   Malignant neoplasm of overlapping sites of right breast in female, estrogen receptor positive (HCC)  03/05/2021 Mammogram   CLINICAL DATA:  Bilateral screening recall for a right breast distortion and possible masses and a possible left breast mass.   EXAM: DIGITAL DIAGNOSTIC BILATERAL MAMMOGRAM WITH TOMOSYNTHESIS AND CAD; ULTRASOUND LEFT BREAST LIMITED; ULTRASOUND RIGHT BREAST LIMITED  IMPRESSION: 1. There is a prominent distortion in the superior retroareolar right breast with a sonographic correlate at 12 o'clock. Cine images through this region demonstrates that the actual area of abnormality may be broader than the 1.5 cm ultrasound measurement.    2. There is an indeterminate 5 mm mass in the right breast at 3 o'clock.   3.  There is an indeterminate 5 mm mass in the right breast at 9:30.   4.  No evidence of bilateral axillary lymphadenopathy.   5.  The mass in the left breast corresponds with a benign cyst.   03/15/2021 Cancer Staging   Staging form: Breast, AJCC 8th Edition - Clinical stage from 03/15/2021: Stage 0 (cTis (DCIS), cN0, cM0, G2, ER+, PR+, HER2: Not Assessed) - Signed by Sonja , MD on 03/26/2021 Stage prefix: Initial diagnosis Histologic grading system: 3 grade system   03/15/2021 Initial Biopsy   Diagnosis 1. Breast, right, needle core biopsy, 12 o'clock, 1cmfn, ribbon -DUCTAL CARCINOMA IN SITU, INTERMEDIATE NUCLEAR GRADE, SOLID TYPE WITHOUT NECROSIS -NEGATIVE FOR INVASIVE CARCINOMA -COMPLEX SCLEROSING LESION WITH MICROCALCIFICATIONS -PROLIFERATIVE FIBROCYSTIC CHANGES INCLUDING EPITHELIAL HYPERPLASIA WITHOUT ATYPIA 2. Breast, right, needle core biopsy, 3 o'clock, 5cmfn, coil -RADIAL SCAR/COMPLEX SCLEROSING LESION -NEGATIVE FOR MICROCALCIFICATIONS -NEGATIVE FOR CARCINOMA 3. Breast, right, needle core biopsy, 9:30 o'clock, 5cmfn, heart -HIGH-GRADE DUCTAL CARCINOMA IN SITU, SOLID TYPE WITHOUT NECROSIS INVOLVING A FIBROADENOMA -NEGATIVE FOR INVASIVE CARCINOMA -NEGATIVE FOR CALCIFICATIONS  1. PROGNOSTIC INDICATORS Results: Estrogen Receptor: 90%, POSITIVE, STRONG STAINING INTENSITY Progesterone Receptor: 40%, POSITIVE, STRONG STAINING INTENSITY  3. PROGNOSTIC INDICATORS Results: Estrogen Receptor: 100%, POSITIVE, STRONG STAINING INTENSITY Progesterone Receptor: 10%, POSITIVE, STRONG STAINING INTENSITY   03/21/2021 Initial Diagnosis   Ductal carcinoma in situ (DCIS) of right breast    Genetic Testing   [facility] CancerNext Panel was Negative. Report date is 04/08/2021.  The CancerNext gene panel offered by [employer] includes sequencing, rearrangement analysis, and RNA analysis for the following 36  genes:   APC, ATM, AXIN2,  BARD1, BMPR1A, BRCA1, BRCA2, BRIP1, CDH1, CDK4, CDKN2A, CHEK2, DICER1, HOXB13, EPCAM, GREM1, MLH1, MSH2, MSH3, MSH6, MUTYH, NBN, NF1, NTHL1, PALB2, PMS2, POLD1, POLE, PTEN, RAD51C, RAD51D, RECQL, SMAD4, SMARCA4, STK11, and TP53.    04/18/2021 Pathology Results   Diagnosis Breast, right, needle core biopsy, posterior central FOCAL INVASIVE MODERATELY DIFFERENTIATED DUCTAL CARCINOMA, GRADE 2 (3+2+1) EXTENSIVE HIGH-GRADE DUCTAL CARCINOMA IN SITU, SOLID TYPE WITH NECROSIS INVASIVE TUMOR MEASURES 1.5 MM COMPLEX SCLEROSING LESION AND FIBROCYSTIC CHANGES INCLUDING EPITHELIAL HYPERPLASIA WITHOUT ATYPIA  PROGNOSTIC INDICATORS Results: The tumor cells are EQUIVOCAL for Her2 (2+). Her2 by FISH will be performed and the results reported separately. Estrogen Receptor: 100%, POSITIVE, STRONG STAINING INTENSITY Progesterone Receptor: 90%, POSITIVE, MODERATE STAINING INTENSITY Proliferation Marker Ki67: 10%  FLUORESCENCE IN-SITU HYBRIDIZATION Results: GROUP 4: HER2 **NEGATIVE**   05/01/2021 Cancer Staging   Staging form: Breast, AJCC 8th Edition - Pathologic stage from 05/01/2021: Stage IA (pT1a, pN0, cM0, G2, ER+, PR+, HER2-) - Signed by Percival Brace, NP on 03/27/2022 Stage prefix: Initial diagnosis Histologic grading system: 3 grade system Residual tumor (R): R0 - None   05/01/2021 Definitive Surgery   FINAL MICROSCOPIC DIAGNOSIS:   A. BREAST, RIGHT MEDIAL, LUMPECTOMY:  A small focus of residual DCIS in the vicinity of healed biopsy site  with clear margins of resection.   B. BREAST, RIGHT LATERAL, LUMPECTOMY:  Residual foci of intermediate grade DCIS with solid growth pattern  adjacent to organized blood clots with clear margins of resection.  Invasive carcinoma is not identified.  Please see the synoptic report for specimen E.   C. LYMPH NODE, RIGHT AXILLARY, SENTINEL, EXCISION:  A lymph node with fatty infiltration which is negative for metastatic   carcinoma.   D. LYMPH NODE, RIGHT AXILLARY, SENTINEL, EXCISION:  A lymph node with fatty infiltration which is negative for metastatic  carcinoma.   E. LYMPH NODE, RIGHT AXILLARY, SENTINEL, EXCISION:  A lymph node negative for metastatic carcinoma.    05/28/2021 - 06/26/2021 Radiation Therapy   Site Technique Total Dose (Gy) Dose per Fx (Gy) Completed Fx Beam Energies  Breast, Right: Breast_R 3D 42.56/42.56 2.66 16/16 6XFFF  Breast, Right: Breast_R_Bst 3D 8/8 2 4/4 6X, 10X     Cancer of left fallopian tube (HCC)  04/15/2023 Pathology Results   SURGICAL PATHOLOGY  CASE: [id number]  PATIENT: Jacqueline Ortiz  Surgical Pathology Report   Clinical History: uterovaginal prolapse complete (cm)   FINAL MICROSCOPIC DIAGNOSIS:   A. 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  Tumor measures 0.8 cm in greatest dimension and involves the serosal surface (pT1c2)  Focal serous tubal intraepithelial carcinoma (STIC)  Adenomyosis  Benign inactive endometrium with cystic change  Acute chronic cervicitis with squamous metaplasia  Benign right fallopian tube  Benign ovaries   ONCOLOGY TABLE:  OVARY or FALLOPIAN TUBE or PRIMARY PERITONEUM: Resection  Procedure: Total hysterectomy and bilateral salpingo-oophorectomy Specimen Integrity: Intact Tumor Site: Left fallopian tube Tumor Size: 0.8 cm (estimated from glass slide) Histologic Type: Serous carcinoma Histologic Grade: High-grade Ovarian Surface Involvement: Not identified Fallopian Tube Surface Involvement: Present Implants: Not applicable Lymphatic and/or Vascular Invasion: Not identified Other Tissue/ Organ Involvement: Not applicable Largest Extrapelvic Peritoneal Focus: Not applicable Peritoneal/Ascitic Fluid Involvement: Not applicable Chemotherapy Response Score (CRS): Not applicable, no known  presurgical therapy Regional Lymph Nodes: Not applicable (no lymph nodes submitted or found) Distant Metastasis: Not applicable] Pathologic Stage Classification (pTNM, AJCC 8th Edition): pT1c2, pN n/a  Ancillary Studies: Available upon request. Representative Tumor Block: A6 Comment(s): The left fallopian tube shows and infiltrating tumor comprised of solid and cribriform sheets of large atypical cells exhibiting focal hobnail architecture.  Similar-appearing cells with appear to focally involve the epithelium lining adjacent plicae.  2 immunohistochemical stains are performed with adequate control.  The neoplastic cells are positive for p16 and p53 supporting the above diagnosis.  The epithelium lining adjacent plicae exhibits patchy to focal confluent staining for p53 compatible with focal serous tubal intraepithelial carcinoma (STIC).  The tumor involves 2 sections estimated to be 0.4 cm in thickness thus 0.8 cm in greatest dimension.    04/15/2023 Surgery   Procedures performed:  Robotic assisted total laparoscopic hysterectomy, with bilateral salpingo-oophorectomy, sacrocolpopexy Cleopatra Dada Lite Y), perineorrhaphy, cystoscopy   Implants:  Implant Name Type Inv. Item Serial No. Manufacturer Lot No. LRB No. Used Action  MESH Terrea Ferrier [id number] [id number] Mesh General MESH Valli Gaw   [facility] MEDICAL [id number] N/A 1 Implanted      Attending Surgeon: [name], MD     Assistant: Santana Cue RNFA   Anesthesia: General endotracheal   Findings: 1. On vaginal exam, stage IV prolapse present                2. On cystoscopy, bladder mucosa with trabeculations, normal urethral mucosa. No evidence of bladder injury. Brisk bilateral ureteral efflux present.                  3. On laparoscopy, no significant adhesions noted. Normal appearing uterus, bilateral fallopian tubes and ovaries. Enlarged ureters present bilaterally.   05/21/2023 Initial Diagnosis   Cancer of  left fallopian tube (HCC)   05/26/2023 Imaging   CT CHEST ABDOMEN PELVIS W CONTRAST Result Date: 05/26/2023 CLINICAL DATA:  Ovarian cancer. High-grade serous carcinoma of the left fallopian tube. Staging. * Tracking Code: BO * EXAM: CT CHEST, ABDOMEN, AND PELVIS WITH CONTRAST TECHNIQUE: Multidetector CT imaging of the chest, abdomen and pelvis was performed following the standard protocol during bolus administration of intravenous contrast. RADIATION DOSE REDUCTION: This exam was performed according to the departmental dose-optimization program which includes automated exposure control, adjustment of the mA and/or kV according to patient size and/or use of iterative reconstruction technique. CONTRAST:  OMNIPAQUE IOHEXOL 300 MG/ML  SOLN COMPARISON:  12/11/2015 FINDINGS: CT CHEST FINDINGS Cardiovascular: The heart size is normal. No substantial pericardial effusion. Coronary artery calcification is evident. Mild atherosclerotic calcification is noted in the wall of the thoracic aorta. Right Port-A-Cath tip is positioned in the right atrium. Mediastinum/Nodes: No mediastinal lymphadenopathy. There is no hilar lymphadenopathy. Large hiatal hernia. The esophagus has normal imaging features. There is no axillary lymphadenopathy. Lungs/Pleura: 4 mm left upper lobe nodule on 44/4. No overtly suspicious pulmonary nodule or mass. No focal airspace consolidation. No pleural effusion. Musculoskeletal: Thoracolumbar scoliosis. No worrisome lytic or sclerotic osseous abnormality. CT ABDOMEN PELVIS FINDINGS Hepatobiliary: No suspicious focal abnormality within the liver parenchyma. There is no evidence for gallstones, gallbladder wall thickening, or pericholecystic fluid. No intrahepatic or extrahepatic biliary dilation. Pancreas: No focal mass lesion. No dilatation of the main duct. No intraparenchymal cyst. No peripancreatic edema. Spleen: No splenomegaly. No suspicious focal mass lesion. Adrenals/Urinary Tract: No  adrenal nodule or mass. Kidneys unremarkable. No evidence for hydroureter. Bladder is nondistended which accentuates wall thickness although a degree of superimposed circumferential wall thickening is suspected. Stomach/Bowel: Large hiatal hernia. Duodenum is normally positioned as is the ligament of Treitz.  Large duodenal diverticulum noted. No small bowel wall thickening. No small bowel dilatation. Mobile cecal tip is high in the right abdomen, positioned anterior to the liver. The terminal ileum is normal. The appendix is not well visualized, but there is no edema or inflammation in the region of the cecal tip to suggest appendicitis. No gross colonic mass. No colonic wall thickening. Diverticular changes are noted in the left colon without evidence of diverticulitis. Vascular/Lymphatic: There is moderate atherosclerotic calcification of the abdominal aorta without aneurysm. There is no gastrohepatic or hepatoduodenal ligament lymphadenopathy. No retroperitoneal or mesenteric lymphadenopathy. No pelvic sidewall lymphadenopathy. Reproductive: Hysterectomy.  No adnexal mass. Other: No intraperitoneal free fluid. No evidence for omental stranding or nodularity. No discernible peritoneal nodularity. Musculoskeletal: No worrisome lytic or sclerotic osseous abnormality. Thoracolumbar scoliosis. Bones are diffusely demineralized. IMPRESSION: 1. No evidence for metastatic disease in the chest, abdomen, or pelvis. Specifically, no ascites. No omental/mesenteric stranding or nodularity. No evidence for peritoneal nodularity. 2. 4 mm left upper lobe pulmonary nodule. This is almost assuredly benign. Attention on follow-up recommended. 3. Large hiatal hernia. 4. Left colonic diverticulosis without diverticulitis. 5.  Aortic Atherosclerosis (ICD10-I70.0). Electronically Signed   By: Donnal Fusi M.D.   On: 05/26/2023 08:44   IR IMAGING GUIDED PORT INSERTION Result Date: 05/21/2023 INDICATION: chemo, fallopian tube cancer  EXAM: IMPLANTED PORT A CATH PLACEMENT WITH ULTRASOUND AND FLUOROSCOPIC GUIDANCE MEDICATIONS: None ANESTHESIA/SEDATION: Moderate (conscious) sedation was employed during this procedure. A total of Versed  2 mg and Fentanyl  100 mcg was administered intravenously. Moderate Sedation Time: 26 minutes. The patient's level of consciousness and vital signs were monitored continuously by radiology nursing throughout the procedure under my direct supervision. FLUOROSCOPY TIME:  Fluoroscopic dose; 1 mGy COMPLICATIONS: None immediate. PROCEDURE: The procedure, risks, benefits, and alternatives were explained to the patient. Questions regarding the procedure were encouraged and answered. The patient understands and consents to the procedure. The RIGHT neck and chest were prepped with chlorhexidine  in a sterile fashion, and a sterile drape was applied covering the operative field. Maximum barrier sterile technique with sterile gowns and gloves were used for the procedure. A timeout was performed prior to the initiation of the procedure. Local anesthesia was provided with 1% lidocaine  with epinephrine . After creating a small venotomy incision, a micropuncture kit was utilized to access the internal jugular vein under direct, real-time ultrasound guidance. Ultrasound image documentation was performed. The microwire was kinked to measure appropriate catheter length. A subcutaneous port pocket was then created along the upper chest wall utilizing a combination of sharp and blunt dissection. The pocket was irrigated with sterile saline. A single lumen power injectable port was chosen for placement. The 8 Fr catheter was tunneled from the port pocket site to the venotomy incision. The port was placed in the pocket. The external catheter was trimmed to appropriate length. At the venotomy, an 8 Fr peel-away sheath was placed over a guidewire under fluoroscopic guidance. The catheter was then placed through the sheath and the sheath was  removed. Final catheter positioning was confirmed and documented with a fluoroscopic spot radiograph. The port was accessed with a Huber needle, aspirated and flushed with heparinized saline. The port pocket incision was closed with interrupted 3-0 Vicryl suture then Dermabond was applied, including at the venotomy incision. Dressings were placed. The patient tolerated the procedure well without immediate post procedural complication. IMPRESSION: Successful placement of a RIGHT internal jugular approach power injectable Port-A-Cath. The tip of the catheter is positioned within the proximal RIGHT  atrium. The catheter is ready for immediate use. Art Largo, MD [facility] Radiology Electronically Signed   By: Art Largo M.D.   On: 05/21/2023 17:05      05/26/2023 Cancer Staging   Staging form: Ovary, Fallopian Tube, and Primary Peritoneal Carcinoma, AJCC 8th Edition - Pathologic stage from 05/26/2023: FIGO Stage II (pT2, pN0, cM0) - Signed by Almeda Jacobs, MD on 05/26/2023   06/04/2023 -  Chemotherapy   Patient is on Treatment Plan : OVARIAN Carboplatin (AUC 6) + Paclitaxel (175) q21d X 6 Cycles       MEDICAL HISTORY:  Past Medical History:  Diagnosis Date   Complete uterovaginal prolapse    Diverticulosis of colon    Dry eyes, bilateral 2000   Duodenal diverticulum    Dyslipidemia 2008   Eczema 1993   GAD (generalized anxiety disorder)    Hemorrhoids 2003   internal and external- remains- treats as needed with topical gels   History of adenomatous polyp of colon    followed by  Dr. Loy Ruff   History of hiatal hernia    large HH  by EGD s/p  [facility] repair and nissen fundoplication 02-06-2016 by dr Melton Squires   History of kidney stones    1980s   Hyperlipidemia 2008   Hypertension 2021   Hypothyroidism    IBS (irritable bowel syndrome)    Malignant neoplasm of overlapping sites of right breast in female, estrogen receptor positive (HCC)  02/2021   oncologist--- dr Lee Public surgeon-- dr Afton Horse;   dx 02/ 2023;  Stage 1A3,  ER/PR+;  HG DCIS,  nodes negative;  04/ 05/ 2023  s/p  right lumpectomy w/ node dissecton's;  completed radiation 06-26-2021;  forego antiestrogen therapy   OA (osteoarthritis)    Osteoporosis 2003   Panic disorder    patient reports anxiety most of her life   Prolapse of vaginal walls    anterior and posterior   S/P dilatation of esophageal stricture 2008   s/p   2008/  2010   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 Loy Ruff   CYSTOSCOPY N/A 04/15/2023   Procedure: CYSTOSCOPY;  Surgeon: Arma Lamp, MD;  Location: [facility] OR;  Service: Gynecology;  Laterality: N/A;   DILATION AND CURETTAGE OF UTERUS     yrs ago   ESOPHAGOGASTRODUODENOSCOPY N/A 10/21/2015   Procedure: ESOPHAGOGASTRODUODENOSCOPY (EGD);  Surgeon: Janel Medford, MD;  Location: [facility];  Service: Endoscopy;  Laterality: N/A;   IR IMAGING GUIDED PORT INSERTION  05/21/2023   LARYNX SURGERY  1993   pt stated due to left vocal cord paralized (unknown cause) told "splints" placed   PATELLA  FRACTURE SURGERY Right 1996   has retained hardware   PERINEOPLASTY N/A  04/15/2023   Procedure: PERINEOPLASTY;  Surgeon: Arma Lamp, MD;  Location: [facility] OR;  Service: Gynecology;  Laterality: N/A;   ROBOTIC ASSISTED LAPAROSCOPIC NISSEN FUNDOPLICATION  02/06/2016   @[facility]  by dr Melton Squires;  ROBOTIC ASSISTED HIATAL HERNIA REPAIR WITH MESH & NISSEN FUNDOPLICATION/  GASTROSTOMY TUBE   ROBOTIC ASSISTED LAPAROSCOPIC SACROCOLPOPEXY N/A 04/15/2023   Procedure: SACROCOLPOPEXY, ROBOT-ASSISTED, LAPAROSCOPIC;  Surgeon: Arma Lamp, MD;  [facility];  Service: Gynecology;  Laterality: N/A;   ROBOTIC ASSISTED TOTAL HYSTERECTOMY WITH BILATERAL SALPINGO OOPHERECTOMY N/A 04/15/2023   Procedure: ROBOTIC ASSISTED TOTAL HYSTERECTOMY WITH BILATERAL SALPINGO OOPHORECTOMY;  Surgeon: Arma Lamp, MD;  Location: [facility] OR;  Service: Gynecology;  Laterality: N/A;  Total time requested is 3.5hrs   SENTINEL NODE BIOPSY Right 05/01/2021   Procedure: SENTINEL NODE BIOPSY;  Surgeon: Sim Dryer, MD;  Location: [facility] OR;  Service: General;  Laterality: Right;   TONSILLECTOMY  1953   age 53   TUBAL LIGATION Bilateral 1977    SOCIAL HISTORY: Social History   Socioeconomic History   Marital status: Married    Spouse name: Not on file   Number of children: 1   Years of education: Not on file   Highest education level: Not on file  Occupational History   Not on file  Tobacco Use   Smoking status: Never   Smokeless tobacco: Never  Vaping Use   Vaping status: Never Used  Substance and Sexual Activity   Alcohol use: No   Drug use: Never   Sexual activity: Yes    Birth control/protection: Post-menopausal  Other Topics Concern   Not on file  Social History Narrative   Not on file   Social Drivers of Health   Financial Resource Strain: Not on file  Food Insecurity: No Food Insecurity (05/07/2023)   Hunger Vital Sign    Worried About Running Out of Food in the Last Year: Never true    Ran Out of Food in the Last Year: Never true  Transportation Needs: No  Transportation Needs (05/07/2023)   PRAPARE - Administrator, Civil Service (Medical): No    Lack of Transportation (Non-Medical): No  Physical Activity: Not on file  Stress: Not on file  Social Connections: Not on file  Intimate Partner Violence: Not At Risk (05/07/2023)   Humiliation, Afraid, Rape, and Kick questionnaire    Fear of Current or Ex-Partner: No    Emotionally Abused: No    Physically Abused: No    Sexually Abused: No    FAMILY HISTORY: Family History  Problem Relation Age of Onset   Heart disease Mother    Diabetes Mother    Melanoma Father    Heart disease Father    Arthritis Father    Heart disease Sister    Skin cancer Brother        patient unsure of what type of skin cancer brother had   Colon polyps Brother    Cancer Maternal Aunt 65       sinus cancer   Colon cancer Maternal Grandmother 29   Esophageal cancer Neg Hx    Rectal cancer Neg Hx    Stomach cancer Neg Hx    Breast cancer Neg Hx    Prostate cancer Neg Hx    Pancreatic cancer Neg Hx    Ovarian cancer Neg Hx    Endometrial cancer Neg Hx     ALLERGIES:  has no known allergies.  MEDICATIONS:  Current Outpatient Medications  Medication Sig Dispense Refill   dexamethasone  (DECADRON ) 4 MG tablet Take 2 tabs at the night before and 2 tab the morning of chemotherapy, every 3 weeks, by mouth x 6 cycles 24 tablet 6   polyethylene glycol (MIRALAX  / GLYCOLAX ) 17 g packet Take 17 g by mouth daily.     atorvastatin  (LIPITOR) 20 MG tablet Take 20 mg by mouth at bedtime.     Calcium -Magnesium -Vitamin D 500-50-100 MG-MG-UNIT CHEW Chew 2 capsules by mouth at bedtime.     Doxylamine-DM (ROBITUSSIN NIGHTTIME COUGH DM) 3.125-7.5 MG/5ML LIQD Take 5-10 mLs by mouth at bedtime as needed (cough).     escitalopram (LEXAPRO) 10 MG tablet Take 10 mg by mouth at bedtime.     estradiol  (ESTRACE ) 0.1 MG/GM vaginal cream PLACE 0.5 GRAMS VAGINALLY AT BEDTIME FOR 14 DAYS THEN USE TWICE WEEKLY (Patient taking  differently: Place 1 Applicatorful vaginally 2 (two) times a week. BEDTIME) 42.5 g 0   Ketotifen Fumarate (REFRESH EYE ITCH RELIEF OP) Place 1 drop into both eyes 4 (four) times daily as needed (eye irritation.).     levothyroxine (SYNTHROID) 25 MCG tablet Take 25 mcg by mouth daily before breakfast.     lidocaine -prilocaine (EMLA) cream Apply to affected area once 30 g 3   loratadine (CLARITIN) 10 MG tablet Take 10 mg by mouth daily as needed for allergies or rhinitis.     ondansetron  (ZOFRAN ) 8 MG tablet Take 1 tablet (8 mg total) by mouth every 8 (eight) hours as needed for nausea or vomiting. Start on the third day after carboplatin. 30 tablet 1   phenylephrine  (SUDAFED PE) 10 MG TABS tablet Take 10 mg by mouth every 4 (four) hours as needed (sinus issues/congestion.).     Phenylephrine -Witch Hazel (PREPARATION H) 0.25-50 % GEL Apply 1 application  topically daily as needed (irritation/discomfort).     prochlorperazine (COMPAZINE) 10 MG tablet Take 1 tablet (10 mg total) by mouth every 6 (six) hours as needed for nausea or vomiting. 30 tablet 1   sodium chloride  (OCEAN) 0.65 % SOLN nasal spray Place 1 spray into both nostrils as needed for congestion.     triamterene -hydrochlorothiazide  (MAXZIDE -25) 37.5-25 MG tablet Take 1 tablet by mouth in the morning.     No current facility-administered medications for this visit.    REVIEW OF SYSTEMS: She has chronic constipation Constitutional: Denies fevers, chills or abnormal night sweats Eyes: Denies blurriness of vision, double vision or watery eyes Ears, nose, mouth, throat, and face: Denies mucositis or sore throat Respiratory: Denies cough, dyspnea or wheezes Cardiovascular: Denies palpitation, chest discomfort or lower extremity swelling Skin: Denies abnormal skin rashes Lymphatics: Denies new lymphadenopathy or easy bruising Neurological:Denies numbness, tingling or new weaknesses Behavioral/Psych: Mood is stable, no new changes  All  other systems were reviewed with the patient and are negative.  PHYSICAL EXAMINATION: ECOG PERFORMANCE STATUS: 0 - Asymptomatic  Vitals:   05/26/23 1336  BP: (!) 146/77  Pulse: 98  Resp: 17  Temp: 98.4 F (36.9 C)  SpO2: 97%   Filed Weights   05/26/23 1336  Weight: 125 lb 3.2 oz (56.8 kg)    GENERAL:alert, no distress and comfortable SKIN: skin color, texture, turgor are normal, no rashes or significant lesions EYES: normal, conjunctiva are pink and non-injected, sclera clear OROPHARYNX:no exudate, normal lips, buccal mucosa, and tongue  NECK: supple, thyroid normal size, non-tender, without nodularity LYMPH:  no palpable lymphadenopathy in the cervical,  axillary or inguinal LUNGS: clear to auscultation and percussion with normal breathing effort HEART: regular rate & rhythm and no murmurs without lower extremity edema ABDOMEN:abdomen soft, non-tender and normal bowel sounds.  Normal healing on her incisions Musculoskeletal:no cyanosis of digits and no clubbing.  Noted bruising around her port site PSYCH: alert & oriented x 3 with fluent speech NEURO: no focal motor/sensory deficits  LABORATORY DATA:  I have reviewed the data as listed Lab Results  Component Value Date   WBC 4.9 05/11/2023   HGB 13.3 05/11/2023   HCT 42.2 05/11/2023   MCV 91.1 05/11/2023   PLT 214 05/11/2023   Recent Labs    05/27/22 0420 04/10/23 1125 04/13/23 1133 05/11/23 1239  NA 133* 137 139 141  K 3.7 3.4* 3.6 4.2  CL 104 94* 97 104  CO2 24 31 27 31   GLUCOSE 99 110* 95 100*  BUN 6* 12 11 12   CREATININE 0.78 1.14* 1.10* 1.01*  CALCIUM  8.6* 10.5* 9.7 9.9  GFRNONAA >60 50*  --  58*  PROT  --   --   --  7.4  ALBUMIN   --   --   --  4.2  AST  --   --   --  22  ALT  --   --   --  15  ALKPHOS  --   --   --  84  BILITOT  --   --   --  0.3    RADIOGRAPHIC STUDIES: I have personally reviewed the radiological images as listed and agreed with the findings in the report. 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 Staging. * Tracking Code: BO * EXAM: CT CHEST, ABDOMEN, AND PELVIS WITH CONTRAST TECHNIQUE: Multidetector CT imaging of the chest, abdomen and pelvis was performed following the standard protocol during bolus administration of intravenous contrast. RADIATION DOSE REDUCTION: This exam was performed according to the departmental dose-optimization program which includes automated exposure control, adjustment of the mA and/or kV according to patient size and/or use of iterative reconstruction technique. CONTRAST:  OMNIPAQUE IOHEXOL 300 MG/ML  SOLN COMPARISON:  12/11/2015 FINDINGS: CT CHEST FINDINGS Cardiovascular: The heart size is normal. No substantial pericardial effusion. Coronary artery calcification is evident. Mild atherosclerotic calcification is noted in the wall of the thoracic aorta. Right Port-A-Cath tip is positioned in the right atrium. Mediastinum/Nodes: No mediastinal lymphadenopathy. There is no hilar lymphadenopathy. Large hiatal hernia. The esophagus has normal imaging features. There is no axillary lymphadenopathy. Lungs/Pleura: 4 mm left upper lobe nodule on 44/4. No overtly suspicious pulmonary nodule or mass. No focal airspace consolidation. No pleural effusion. Musculoskeletal: Thoracolumbar scoliosis. No worrisome lytic or sclerotic osseous abnormality. CT ABDOMEN PELVIS FINDINGS Hepatobiliary: No suspicious focal abnormality within the liver parenchyma. There is no evidence for gallstones, gallbladder wall thickening, or pericholecystic fluid. No intrahepatic or extrahepatic biliary dilation. Pancreas: No focal mass lesion. No dilatation of the main duct. No intraparenchymal cyst. No peripancreatic edema. Spleen: No splenomegaly. No suspicious focal mass lesion. Adrenals/Urinary Tract: No adrenal nodule or mass. Kidneys unremarkable. No evidence for  hydroureter. Bladder is nondistended which accentuates wall thickness although a degree of superimposed circumferential wall thickening is suspected. Stomach/Bowel: Large hiatal hernia. Duodenum is normally positioned as is the ligament of Treitz. Large duodenal diverticulum noted. No small bowel wall thickening. No small bowel dilatation. Mobile cecal tip is high in the right abdomen, positioned anterior to  the liver. The terminal ileum is normal. The appendix is not well visualized, but there is no edema or inflammation in the region of the cecal tip to suggest appendicitis. No gross colonic mass. No colonic wall thickening. Diverticular changes are noted in the left colon without evidence of diverticulitis. Vascular/Lymphatic: There is moderate atherosclerotic calcification of the abdominal aorta without aneurysm. There is no gastrohepatic or hepatoduodenal ligament lymphadenopathy. No retroperitoneal or mesenteric lymphadenopathy. No pelvic sidewall lymphadenopathy. Reproductive: Hysterectomy.  No adnexal mass. Other: No intraperitoneal free fluid. No evidence for omental stranding or nodularity. No discernible peritoneal nodularity. Musculoskeletal: No worrisome lytic or sclerotic osseous abnormality. Thoracolumbar scoliosis. Bones are diffusely demineralized. IMPRESSION: 1. No evidence for metastatic disease in the chest, abdomen, or pelvis. Specifically, no ascites. No omental/mesenteric stranding or nodularity. No evidence for peritoneal nodularity. 2. 4 mm left upper lobe pulmonary nodule. This is almost assuredly benign. Attention on follow-up recommended. 3. Large hiatal hernia. 4. Left colonic diverticulosis without diverticulitis. 5.  Aortic Atherosclerosis (ICD10-I70.0). Electronically Signed   By: Donnal Fusi M.D.   On: 05/26/2023 08:44   IR IMAGING GUIDED PORT INSERTION Result Date: 05/21/2023 INDICATION: chemo, fallopian tube cancer EXAM: IMPLANTED PORT A CATH PLACEMENT WITH ULTRASOUND AND  FLUOROSCOPIC GUIDANCE MEDICATIONS: None ANESTHESIA/SEDATION: Moderate (conscious) sedation was employed during this procedure. A total of Versed  2 mg and Fentanyl  100 mcg was administered intravenously. Moderate Sedation Time: 26 minutes. The patient's level of consciousness and vital signs were monitored continuously by radiology nursing throughout the procedure under my direct supervision. FLUOROSCOPY TIME:  Fluoroscopic dose; 1 mGy COMPLICATIONS: None immediate. PROCEDURE: The procedure, risks, benefits, and alternatives were explained to the patient. Questions regarding the procedure were encouraged and answered. The patient understands and consents to the procedure. The RIGHT neck and chest were prepped with chlorhexidine  in a sterile fashion, and a sterile drape was applied covering the operative field. Maximum barrier sterile technique with sterile gowns and gloves were used for the procedure. A timeout was performed prior to the initiation of the procedure. Local anesthesia was provided with 1% lidocaine  with epinephrine . After creating a small venotomy incision, a micropuncture kit was utilized to access the internal jugular vein under direct, real-time ultrasound guidance. Ultrasound image documentation was performed. The microwire was kinked to measure appropriate catheter length. A subcutaneous port pocket was then created along the upper chest wall utilizing a combination of sharp and blunt dissection. The pocket was irrigated with sterile saline. A single lumen power injectable port was chosen for placement. The 8 Fr catheter was tunneled from the port pocket site to the venotomy incision. The port was placed in the pocket. The external catheter was trimmed to appropriate length. At the venotomy, an 8 Fr peel-away sheath was placed over a guidewire under fluoroscopic guidance. The catheter was then placed through the sheath and the sheath was removed. Final catheter positioning was confirmed and  documented with a fluoroscopic spot radiograph. The port was accessed with a Huber needle, aspirated and flushed with heparinized saline. The port pocket incision was closed with interrupted 3-0 Vicryl suture then Dermabond was applied, including at the venotomy incision. Dressings were placed. The patient tolerated the procedure well without immediate post procedural complication. IMPRESSION: Successful placement of a RIGHT internal jugular approach power injectable Port-A-Cath. The tip of the catheter is positioned within the proximal RIGHT atrium. The catheter is ready for immediate use. Art Largo, MD [facility] Electronically Signed   By: Sam Creighton  Mugweru M.D.   On: 05/21/2023 17:05    ASSESSMENT & PLAN:  Cancer of left fallopian tube [facility]) 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 I reviewed CT imaging that was done yesterday which showed no evidence of residual disease Tumor marker was not helpful Final pathology high-grade serous, p53 mutated  We reviewed the NCCN guidelines We discussed the role of chemotherapy. The intent is of curative intent.  We discussed some of the risks, benefits, side-effects of carboplatin & Taxol. Treatment is intravenous, every 3 weeks x 6 cycles  Some of the short term side-effects included, though not limited to, including weight loss, life threatening infections, risk of allergic reactions, need for transfusions of blood products, nausea, vomiting, change in bowel habits, loss of hair, admission to hospital for various reasons, and risks of death.   Long term side-effects are also discussed including risks of infertility, permanent damage to nerve function, hearing loss, chronic fatigue, kidney damage with possibility needing hemodialysis, and rare secondary malignancy including bone marrow disorders.  The patient  is aware that the response rates discussed earlier is not guaranteed.  After a long discussion, patient made an informed decision to proceed with the prescribed plan of care.   Patient education material was dispensed. We discussed premedication with dexamethasone  before chemotherapy. I recommend chemo education class, blood work and for her to start treatment next week  Malignant neoplasm of overlapping sites of right breast in female, estrogen receptor positive (HCC) She will continue surveillance mammogram  Chronic constipation We discussed importance of aggressive laxative therapy  Orders Placed This Encounter  Procedures   CBC with Differential (Cancer Center Only)    Standing Status:   Future    Expected Date:   06/04/2023    Expiration Date:   06/03/2024   CMP (Cancer Center only)    Standing Status:   Future    Expected Date:   06/04/2023    Expiration Date:   06/03/2024   CBC with Differential (Cancer Center Only)    Standing Status:   Future    Expected Date:   06/25/2023    Expiration Date:   06/24/2024   CMP (Cancer Center only)    Standing Status:   Future    Expected Date:   06/25/2023    Expiration Date:   06/24/2024   CBC with Differential (Cancer Center Only)    Standing Status:   Future    Expected Date:   07/16/2023    Expiration Date:   07/15/2024   CMP (Cancer Center only)    Standing Status:   Future    Expected Date:   07/16/2023    Expiration Date:   07/15/2024   CBC with Differential (Cancer Center Only)    Standing Status:   Future    Expected Date:   08/06/2023    Expiration Date:   08/05/2024   CMP (Cancer Center only)    Standing Status:   Future    Expected Date:   08/06/2023    Expiration Date:   08/05/2024   CBC with Differential (Cancer Center Only)    Standing Status:   Future    Expected Date:   08/27/2023    Expiration Date:   08/26/2024   CMP (Cancer Center only)    Standing Status:   Future    Expected Date:   08/27/2023    Expiration Date:    08/26/2024   CBC  with Differential (Cancer Center Only)    Standing Status:   Future    Expected Date:   09/17/2023    Expiration Date:   09/16/2024   CMP (Cancer Center only)    Standing Status:   Future    Expected Date:   09/17/2023    Expiration Date:   09/16/2024    All questions were answered. The patient knows to call the clinic with any problems, questions or concerns. The total time spent in the appointment was 80 minutes encounter with patients including review of chart and various tests results, discussions about plan of care and coordination of care plan   Almeda Jacobs, MD 05/26/2023 2:24 PM

## 2023-05-27 ENCOUNTER — Encounter: Payer: Self-pay | Admitting: Obstetrics and Gynecology

## 2023-05-27 ENCOUNTER — Ambulatory Visit (INDEPENDENT_AMBULATORY_CARE_PROVIDER_SITE_OTHER): Admitting: Obstetrics and Gynecology

## 2023-05-27 ENCOUNTER — Other Ambulatory Visit: Payer: Self-pay

## 2023-05-27 VITALS — BP 131/84 | HR 94

## 2023-05-27 DIAGNOSIS — Z9889 Other specified postprocedural states: Secondary | ICD-10-CM

## 2023-05-27 DIAGNOSIS — Z48816 Encounter for surgical aftercare following surgery on the genitourinary system: Secondary | ICD-10-CM | POA: Diagnosis not present

## 2023-05-27 DIAGNOSIS — N3281 Overactive bladder: Secondary | ICD-10-CM

## 2023-05-27 MED ORDER — TROSPIUM CHLORIDE 20 MG PO TABS: 20.0000 mg | ORAL_TABLET | Freq: Two times a day (BID) | ORAL | 5 refills | Status: DC

## 2023-05-27 NOTE — Progress Notes (Signed)
 [facility]  Date of Visit: 05/27/2023  History of Present Illness: Jacqueline Ortiz is a 78 y.o. female scheduled today for a post-operative visit.   Surgery: s/p Robotic assisted total laparoscopic hysterectomy, with bilateral salpingo-oophorectomy, sacrocolpopexy Cleopatra Dada Lite Y), perineorrhaphy, cystoscopy on 04/15/23.  She passed her postoperative void trial on 04/21/23  Postoperative course complicated by diagnosis of incidental fallopian tube cancer- referred to GYN Onc/ Heme- Onc and planning for chemotherapy.   Today she reports she is starting to feel like herself. No longer needing diapers but still uses pads. Has a little irritation at the vaginal opening that has improved. She is using the vaginal estrogen.   UTI in the last 6 weeks? No  Pain? No  She has returned to her normal activity (except for postop restrictions) Vaginal bulge? No  Stress incontinence: No  Urgency/frequency: Yes  Urge incontinence: Yes - occasional or if she holds for too long. Never took myrbetriq .  Voiding dysfunction: No  Bowel issues:  normally constipated but under control with miralax    Subjective Success: Do you usually have a bulge or something falling out that you can see or feel in the vaginal area? No  Retreatment Success: Any retreatment with surgery or pessary for any compartment? 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  Tumor measures 0.8 cm in greatest dimension and involves the serosal  surface (pT1c2)  Focal serous tubal intraepithelial carcinoma (STIC)  Adenomyosis  Benign inactive endometrium with cystic change  Acute chronic cervicitis with squamous metaplasia  Benign right fallopian tube  Benign ovaries   Medications: She has a current medication list which includes the following prescription(s): atorvastatin ,  calcium -magnesium -vitamin d, dexamethasone , robitussin nighttime cough dm, escitalopram, estradiol , ketotifen fumarate, levothyroxine, lidocaine -prilocaine, loratadine, ondansetron , phenylephrine , preparation h, polyethylene glycol, prochlorperazine, sodium chloride , triamterene -hydrochlorothiazide , and trospium.   Allergies: Patient has no known allergies.   Physical Exam: BP 131/84   Pulse 94   Abdomen: soft, non-tender, without masses or organomegaly Laparoscopic Incisions: healing well.  Pelvic Examination: perineal incision healing well, no erythema. Vagina: Incisions healing well. Sutures are present at the cuff and there is not granulation tissue. No tenderness along the anterior or posterior vagina. No apical tenderness. No pelvic masses. No visible or palpable mesh.  POP-Q: POP-Q  -3                                            Aa   -3                                           Ba  -8                                              C   3                                            Gh  5  Pb  8                                            tvl   -2                                            Ap  -2                                            Bp                                                 D    ---------------------------------------------------------  Assessment and Plan:  1. Post-operative state   2. Overactive bladder     - Healing well - Can resume regular activity including exercise. Discussed avoidance of heavy lifting and straining long term to reduce the risk of recurrence. Wait an additional 6 weeks for intercourse.  - For OAB symptoms, she is interested in medication. Will start trospium 20mg  BID (Myrbetriq  not covered by insurance). Not having any SUI symptoms currently.   Return in about 2 months for med follow up.  Arma Lamp, MD

## 2023-05-28 NOTE — Progress Notes (Signed)
 Pharmacist Chemotherapy Monitoring - Initial Assessment    Anticipated start date: 06/04/23   The following has been reviewed per standard work regarding the patient's treatment regimen: The patient's diagnosis, treatment plan and drug doses, and organ/hematologic function Lab orders and baseline tests specific to treatment regimen  The treatment plan start date, drug sequencing, and pre-medications Prior authorization status  Patient's documented medication list, including drug-drug interaction screen and prescriptions for anti-emetics and supportive care specific to the treatment regimen The drug concentrations, fluid compatibility, administration routes, and timing of the medications to be used The patient's access for treatment and lifetime cumulative dose history, if applicable  The patient's medication allergies and previous infusion related reactions, if applicable   Changes made to treatment plan:  N/A  Follow up needed:  Pending authorization for treatment    Jaqueline Uber, PharmD, MBA

## 2023-06-02 ENCOUNTER — Inpatient Hospital Stay: Attending: Adult Health

## 2023-06-02 ENCOUNTER — Inpatient Hospital Stay

## 2023-06-02 DIAGNOSIS — K5909 Other constipation: Secondary | ICD-10-CM | POA: Insufficient documentation

## 2023-06-02 DIAGNOSIS — Z452 Encounter for adjustment and management of vascular access device: Secondary | ICD-10-CM | POA: Insufficient documentation

## 2023-06-02 DIAGNOSIS — C5702 Malignant neoplasm of left fallopian tube: Secondary | ICD-10-CM

## 2023-06-02 DIAGNOSIS — Z5111 Encounter for antineoplastic chemotherapy: Secondary | ICD-10-CM | POA: Insufficient documentation

## 2023-06-02 LAB — CBC WITH DIFFERENTIAL (CANCER CENTER ONLY)
Abs Immature Granulocytes: 0.02 10*3/uL (ref 0.00–0.07)
Basophils Absolute: 0.1 10*3/uL (ref 0.0–0.1)
Basophils Relative: 1 %
Eosinophils Absolute: 0.1 10*3/uL (ref 0.0–0.5)
Eosinophils Relative: 2 %
HCT: 40.1 % (ref 36.0–46.0)
Hemoglobin: 13.2 g/dL (ref 12.0–15.0)
Immature Granulocytes: 0 %
Lymphocytes Relative: 14 %
Lymphs Abs: 1 10*3/uL (ref 0.7–4.0)
MCH: 28.8 pg (ref 26.0–34.0)
MCHC: 32.9 g/dL (ref 30.0–36.0)
MCV: 87.6 fL (ref 80.0–100.0)
Monocytes Absolute: 0.6 10*3/uL (ref 0.1–1.0)
Monocytes Relative: 9 %
Neutro Abs: 5.3 10*3/uL (ref 1.7–7.7)
Neutrophils Relative %: 74 %
Platelet Count: 208 10*3/uL (ref 150–400)
RBC: 4.58 MIL/uL (ref 3.87–5.11)
RDW: 15.4 % (ref 11.5–15.5)
WBC Count: 7.1 10*3/uL (ref 4.0–10.5)
nRBC: 0 % (ref 0.0–0.2)

## 2023-06-02 LAB — CMP (CANCER CENTER ONLY)
ALT: 13 U/L (ref 0–44)
AST: 18 U/L (ref 15–41)
Albumin: 4.1 g/dL (ref 3.5–5.0)
Alkaline Phosphatase: 77 U/L (ref 38–126)
Anion gap: 5 (ref 5–15)
BUN: 13 mg/dL (ref 8–23)
CO2: 30 mmol/L (ref 22–32)
Calcium: 9.6 mg/dL (ref 8.9–10.3)
Chloride: 104 mmol/L (ref 98–111)
Creatinine: 0.94 mg/dL (ref 0.44–1.00)
GFR, Estimated: >60 mL/min (ref >60)
Glucose, Bld: 122 mg/dL — ABNORMAL HIGH (ref 70–99)
Potassium: 3.8 mmol/L (ref 3.5–5.1)
Sodium: 139 mmol/L (ref 135–145)
Total Bilirubin: 0.4 mg/dL (ref 0.0–1.2)
Total Protein: 7 g/dL (ref 6.5–8.1)

## 2023-06-02 MED ORDER — HEPARIN SOD (PORK) LOCK FLUSH 100 UNIT/ML IV SOLN
500.0000 [IU] | Freq: Once | INTRAVENOUS | Status: AC
  Administered 2023-06-02: 500 [IU]

## 2023-06-02 MED ORDER — SODIUM CHLORIDE 0.9% FLUSH
10.0000 mL | Freq: Once | INTRAVENOUS | Status: AC
Start: 2023-06-02 — End: 2023-06-02
  Administered 2023-06-02: 10 mL

## 2023-06-03 MED FILL — Fosaprepitant Dimeglumine For IV Infusion 150 MG (Base Eq): INTRAVENOUS | Qty: 5 | Status: AC

## 2023-06-04 ENCOUNTER — Other Ambulatory Visit

## 2023-06-04 ENCOUNTER — Inpatient Hospital Stay

## 2023-06-04 ENCOUNTER — Inpatient Hospital Stay: Admitting: Licensed Clinical Social Worker

## 2023-06-04 VITALS — BP 129/78 | HR 89 | Temp 97.8°F | Resp 19 | Wt 123.5 lb

## 2023-06-04 DIAGNOSIS — Z5111 Encounter for antineoplastic chemotherapy: Secondary | ICD-10-CM | POA: Diagnosis not present

## 2023-06-04 DIAGNOSIS — C5702 Malignant neoplasm of left fallopian tube: Secondary | ICD-10-CM

## 2023-06-04 MED ORDER — FAMOTIDINE IN NACL 20-0.9 MG/50ML-% IV SOLN
20.0000 mg | Freq: Once | INTRAVENOUS | Status: AC
  Administered 2023-06-04: 20 mg via INTRAVENOUS
  Filled 2023-06-04: qty 50

## 2023-06-04 MED ORDER — DEXAMETHASONE SODIUM PHOSPHATE 10 MG/ML IJ SOLN
10.0000 mg | Freq: Once | INTRAMUSCULAR | Status: AC
  Administered 2023-06-04: 10 mg via INTRAVENOUS
  Filled 2023-06-04: qty 1

## 2023-06-04 MED ORDER — SODIUM CHLORIDE 0.9 % IV SOLN
150.0000 mg | Freq: Once | INTRAVENOUS | Status: AC
  Administered 2023-06-04: 150 mg via INTRAVENOUS
  Filled 2023-06-04: qty 150

## 2023-06-04 MED ORDER — SODIUM CHLORIDE 0.9 % IV SOLN
411.6000 mg | Freq: Once | INTRAVENOUS | Status: AC
  Administered 2023-06-04: 410 mg via INTRAVENOUS
  Filled 2023-06-04: qty 41

## 2023-06-04 MED ORDER — CETIRIZINE HCL 10 MG/ML IV SOLN
10.0000 mg | Freq: Once | INTRAVENOUS | Status: AC
  Administered 2023-06-04: 10 mg via INTRAVENOUS
  Filled 2023-06-04: qty 1

## 2023-06-04 MED ORDER — HEPARIN SOD (PORK) LOCK FLUSH 100 UNIT/ML IV SOLN
500.0000 [IU] | Freq: Once | INTRAVENOUS | Status: AC | PRN
  Administered 2023-06-04: 500 [IU]

## 2023-06-04 MED ORDER — SODIUM CHLORIDE 0.9 % IV SOLN
175.0000 mg/m2 | Freq: Once | INTRAVENOUS | Status: AC
  Administered 2023-06-04: 276 mg via INTRAVENOUS
  Filled 2023-06-04: qty 46

## 2023-06-04 MED ORDER — PALONOSETRON HCL INJECTION 0.25 MG/5ML
0.2500 mg | Freq: Once | INTRAVENOUS | Status: AC
  Administered 2023-06-04: 0.25 mg via INTRAVENOUS
  Filled 2023-06-04: qty 5

## 2023-06-04 MED ORDER — SODIUM CHLORIDE 0.9% FLUSH
10.0000 mL | INTRAVENOUS | Status: DC | PRN
Start: 2023-06-04 — End: 2023-06-04
  Administered 2023-06-04: 10 mL

## 2023-06-04 MED ORDER — SODIUM CHLORIDE 0.9 % IV SOLN: INTRAVENOUS | Status: DC

## 2023-06-04 NOTE — Patient Instructions (Signed)
 [facility] - A DEPT OF [facility]  Discharge Instructions: Thank you for choosing [facility] to provide your oncology and hematology care.   If you have a lab appointment with the Cancer Center, please go directly to the Cancer Center and check in at the registration area.   Wear comfortable clothing and clothing appropriate for easy access to any Portacath or PICC line.   We strive to give you quality time with your provider. You may need to reschedule your appointment if you arrive late (15 or more minutes).  Arriving late affects you and other patients whose appointments are after yours.  Also, if you miss three or more appointments without notifying the office, you may be dismissed from the clinic at the provider's discretion.      For prescription refill requests, have your pharmacy contact our office and allow 72 hours for refills to be completed.    Today you received the following chemotherapy and/or immunotherapy agents: Paclitaxel and Carboplatin.      To help prevent nausea and vomiting after your treatment, we encourage you to take your nausea medication as directed.  BELOW ARE SYMPTOMS THAT SHOULD BE REPORTED IMMEDIATELY: *FEVER GREATER THAN 100.4 F (38 C) OR HIGHER *CHILLS OR SWEATING *NAUSEA AND VOMITING THAT IS NOT CONTROLLED WITH YOUR NAUSEA MEDICATION *UNUSUAL SHORTNESS OF BREATH *UNUSUAL BRUISING OR BLEEDING *URINARY PROBLEMS (pain or burning when urinating, or frequent urination) *BOWEL PROBLEMS (unusual diarrhea, constipation, pain near the anus) TENDERNESS IN MOUTH AND THROAT WITH OR WITHOUT PRESENCE OF ULCERS (sore throat, sores in mouth, or a toothache) UNUSUAL RASH, SWELLING OR PAIN  UNUSUAL VAGINAL DISCHARGE OR ITCHING   Items with * indicate a potential emergency and should be followed up as soon as possible or go to the Emergency Department if any problems should occur.  Please show the CHEMOTHERAPY ALERT CARD  or IMMUNOTHERAPY ALERT CARD at check-in to the Emergency Department and triage nurse.  Should you have questions after your visit or need to cancel or reschedule your appointment, please contact [facility] - A DEPT OF [facility]  Dept: [phone]  and follow the prompts.  Office hours are 8:00 a.m. to 4:30 p.m. Monday - Friday. Please note that voicemails left after 4:00 p.m. may not be returned until the following business day.  We are closed weekends and major holidays. You have access to a nurse at all times for urgent questions. Please call the main number to the clinic Dept: [phone] and follow the prompts.   For any non-urgent questions, you may also contact your provider using MyChart. We now offer e-Visits for anyone 82 and older to request care online for non-urgent symptoms. For details visit [email or web address].   Also download the MyChart app! Go to the app store, search "MyChart", open the app, select [place], and log in with your MyChart username and password.  Paclitaxel Injection What is this medication? PACLITAXEL (PAK li TAX el) treats some types of cancer. It works by slowing down the growth of cancer cells. This medicine may be used for other purposes; ask your health care provider or pharmacist if you have questions. COMMON BRAND NAME(S): Onxol, Taxol What should I tell my care team before I take this medication? They need to know if you have any of these conditions: Heart disease Liver disease Low white blood cell levels An unusual or allergic reaction to paclitaxel, other  medications, foods, dyes, or preservatives If you or your partner are pregnant or trying to get pregnant Breast-feeding How should I use this medication? This medication is injected into a vein. It is given by your care team in a hospital or clinic setting. Talk to your care team about the use of this medication in children. While it may be given to  children for selected conditions, precautions do apply. Overdosage: If you think you have taken too much of this medicine contact a poison control center or emergency room at once. NOTE: This medicine is only for you. Do not share this medicine with others. What if I miss a dose? Keep appointments for follow-up doses. It is important not to miss your dose. Call your care team if you are unable to keep an appointment. What may interact with this medication? Do not take this medication with any of the following: Live virus vaccines Other medications may affect the way this medication works. Talk with your care team about all of the medications you take. They may suggest changes to your treatment plan to lower the risk of side effects and to make sure your medications work as intended. This list may not describe all possible interactions. Give your health care provider a list of all the medicines, herbs, non-prescription drugs, or dietary supplements you use. Also tell them if you smoke, drink alcohol, or use illegal drugs. Some items may interact with your medicine. What should I watch for while using this medication? Your condition will be monitored carefully while you are receiving this medication. You may need blood work while taking this medication. This medication may make you feel generally unwell. This is not uncommon as chemotherapy can affect healthy cells as well as cancer cells. Report any side effects. Continue your course of treatment even though you feel ill unless your care team tells you to stop. This medication can cause serious allergic reactions. To reduce the risk, your care team may give you other medications to take before receiving this one. Be sure to follow the directions from your care team. This medication may increase your risk of getting an infection. Call your care team for advice if you get a fever, chills, sore throat, or other symptoms of a cold or flu. Do not treat  yourself. Try to avoid being around people who are sick. This medication may increase your risk to bruise or bleed. Call your care team if you notice any unusual bleeding. Be careful brushing or flossing your teeth or using a toothpick because you may get an infection or bleed more easily. If you have any dental work done, tell your dentist you are receiving this medication. Talk to your care team if you may be pregnant. Serious birth defects can occur if you take this medication during pregnancy. Talk to your care team before breastfeeding. Changes to your treatment plan may be needed. What side effects may I notice from receiving this medication? Side effects that you should report to your care team as soon as possible: Allergic reactions--skin rash, itching, hives, swelling of the face, lips, tongue, or throat Heart rhythm changes--fast or irregular heartbeat, dizziness, feeling faint or lightheaded, chest pain, trouble breathing Increase in blood pressure Infection--fever, chills, cough, sore throat, wounds that don't heal, pain or trouble when passing urine, general feeling of discomfort or being unwell Low blood pressure--dizziness, feeling faint or lightheaded, blurry vision Low red blood cell level--unusual weakness or fatigue, dizziness, headache, trouble breathing Painful swelling, warmth, or  redness of the skin, blisters or sores at the infusion site Pain, tingling, or numbness in the hands or feet Slow heartbeat--dizziness, feeling faint or lightheaded, confusion, trouble breathing, unusual weakness or fatigue Unusual bruising or bleeding Side effects that usually do not require medical attention (report to your care team if they continue or are bothersome): Diarrhea Hair loss Joint pain Loss of appetite Muscle pain Nausea Vomiting This list may not describe all possible side effects. Call your doctor for medical advice about side effects. You may report side effects to FDA at  1-800-FDA-1088. Where should I keep my medication? This medication is given in a hospital or clinic. It will not be stored at home. NOTE: This sheet is a summary. It may not cover all possible information. If you have questions about this medicine, talk to your doctor, pharmacist, or health care provider.  2024 Elsevier/Gold Standard (2021-06-04 00:00:00)  Carboplatin Injection What is this medication? CARBOPLATIN (KAR boe pla tin) treats some types of cancer. It works by slowing down the growth of cancer cells. This medicine may be used for other purposes; ask your health care provider or pharmacist if you have questions. COMMON BRAND NAME(S): Paraplatin What should I tell my care team before I take this medication? They need to know if you have any of these conditions: Blood disorders Hearing problems Kidney disease Recent or ongoing radiation therapy An unusual or allergic reaction to carboplatin, cisplatin, other medications, foods, dyes, or preservatives Pregnant or trying to get pregnant Breast-feeding How should I use this medication? This medication is injected into a vein. It is given by your care team in a hospital or clinic setting. Talk to your care team about the use of this medication in children. Special care may be needed. Overdosage: If you think you have taken too much of this medicine contact a poison control center or emergency room at once. NOTE: This medicine is only for you. Do not share this medicine with others. What if I miss a dose? Keep appointments for follow-up doses. It is important not to miss your dose. Call your care team if you are unable to keep an appointment. What may interact with this medication? Medications for seizures Some antibiotics, such as amikacin, gentamicin, neomycin, streptomycin, tobramycin Vaccines This list may not describe all possible interactions. Give your health care provider a list of all the medicines, herbs,  non-prescription drugs, or dietary supplements you use. Also tell them if you smoke, drink alcohol, or use illegal drugs. Some items may interact with your medicine. What should I watch for while using this medication? Your condition will be monitored carefully while you are receiving this medication. You may need blood work while taking this medication. This medication may make you feel generally unwell. This is not uncommon, as chemotherapy can affect healthy cells as well as cancer cells. Report any side effects. Continue your course of treatment even though you feel ill unless your care team tells you to stop. In some cases, you may be given additional medications to help with side effects. Follow all directions for their use. This medication may increase your risk of getting an infection. Call your care team for advice if you get a fever, chills, sore throat, or other symptoms of a cold or flu. Do not treat yourself. Try to avoid being around people who are sick. Avoid taking medications that contain aspirin, acetaminophen, ibuprofen, naproxen, or ketoprofen unless instructed by your care team. These medications may hide a fever. Be  careful brushing or flossing your teeth or using a toothpick because you may get an infection or bleed more easily. If you have any dental work done, tell your dentist you are receiving this medication. Talk to your care team if you wish to become pregnant or think you might be pregnant. This medication can cause serious birth defects. Talk to your care team about effective forms of contraception. Do not breast-feed while taking this medication. What side effects may I notice from receiving this medication? Side effects that you should report to your care team as soon as possible: Allergic reactions--skin rash, itching, hives, swelling of the face, lips, tongue, or throat Infection--fever, chills, cough, sore throat, wounds that don't heal, pain or trouble when passing  urine, general feeling of discomfort or being unwell Low red blood cell level--unusual weakness or fatigue, dizziness, headache, trouble breathing Pain, tingling, or numbness in the hands or feet, muscle weakness, change in vision, confusion or trouble speaking, loss of balance or coordination, trouble walking, seizures Unusual bruising or bleeding Side effects that usually do not require medical attention (report to your care team if they continue or are bothersome): Hair loss Nausea Unusual weakness or fatigue Vomiting This list may not describe all possible side effects. Call your doctor for medical advice about side effects. You may report side effects to FDA at 1-800-FDA-1088. Where should I keep my medication? This medication is given in a hospital or clinic. It will not be stored at home. NOTE: This sheet is a summary. It may not cover all possible information. If you have questions about this medicine, talk to your doctor, pharmacist, or health care provider.  2024 Elsevier/Gold Standard (2021-05-07 00:00:00)

## 2023-06-04 NOTE — Progress Notes (Signed)
 [facility] Clinical Social Work  Initial Assessment   Jacqueline Ortiz is a 78 y.o. year old female presenting alone. Clinical Social Work was referred by new patient protocol for assessment of psychosocial needs.   SDOH (Social Determinants of Health) assessments performed: Yes SDOH Interventions    Flowsheet Row Clinical Support from 06/04/2023 in [facility] - A Dept Of [place] Office Visit from 05/11/2023 in [facility] - A Dept Of . [facility]  SDOH Interventions    Food Insecurity Interventions Intervention Not Indicated Intervention Not Indicated  Housing Interventions Intervention Not Indicated Intervention Not Indicated  Transportation Interventions Intervention Not Indicated Intervention Not Indicated  Utilities Interventions Intervention Not Indicated --       SDOH Screenings   Food Insecurity: No Food Insecurity (06/04/2023)  Housing: Low Risk  (06/04/2023)  Transportation Needs: No Transportation Needs (06/04/2023)  Utilities: Not At Risk (06/04/2023)  Depression (PHQ2-9): Low Risk  (11/19/2021)  Tobacco Use: Low Risk  (05/27/2023)     Distress Screen completed: Yes    06/02/2023    1:22 PM  ONCBCN DISTRESS SCREENING  Screening Type Initial Screening  Distress experienced in past week (1-10) 2  Other Pt states she is not an anxious person & knowledge helps.      Family/Social Information:  Housing Arrangement: patient lives with her spouse Family members/support persons in your life? Family and Friends Transportation concerns: no  Employment: Retired .  Income source: Actor concerns: No Type of concern: None Food access concerns: no Religious or spiritual practice: Not known Advanced directives: Scientist, research (physical sciences) Currently in place:  [facility] Medicare  Coping/ Adjustment to diagnosis: Patient understands treatment plan and what happens next? 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 Concerns about diagnosis and/or treatment: I'm not especially worried about anything Patient reported stressors: Adjusting to my illness Current coping skills/ strengths: Capable of independent living  and Supportive family/friends     SUMMARY: Current SDOH Barriers:  No major barriers identified today  Clinical Social Work Clinical Goal(s):  No clinical social work goals at this time  Interventions: Discussed common feeling and emotions when being diagnosed with cancer, and the importance of support during treatment Informed patient of the support team roles and support services at [facility] Provided CSW contact information and encouraged patient to call with any questions or concerns   Follow Up Plan: Patient will contact CSW with any support or resource needs Patient verbalizes understanding of plan: Yes    Nabeel Gladson E Bellamy Rubey, LCSW Clinical Social Worker [facility]

## 2023-06-05 ENCOUNTER — Telehealth: Payer: Self-pay

## 2023-06-05 ENCOUNTER — Encounter: Payer: Self-pay | Admitting: Hematology and Oncology

## 2023-06-05 NOTE — Telephone Encounter (Signed)
 Returned her call. She missed a call from the office to see how she is doing after first treatment. She is really doing well and no complaints today. She will call the office back for questions/ concerns.

## 2023-06-05 NOTE — Telephone Encounter (Signed)
-----   Message from Nurse Darry Endo sent at 06/04/2023  4:36 PM EDT ----- Regarding: first time/ paclitaxel  and carboplatin / Dr Marton Sleeper pt Pt had first time taxol  and [place] today, tolerated well.

## 2023-06-05 NOTE — Telephone Encounter (Signed)
 Called & left message on pt's vm to call back to let us  know how she is doing post chemo.

## 2023-06-23 ENCOUNTER — Encounter: Payer: Self-pay | Admitting: Physical Therapy

## 2023-06-23 ENCOUNTER — Other Ambulatory Visit: Payer: Self-pay

## 2023-06-23 ENCOUNTER — Encounter: Attending: Obstetrics and Gynecology | Admitting: Physical Therapy

## 2023-06-23 DIAGNOSIS — Z483 Aftercare following surgery for neoplasm: Secondary | ICD-10-CM | POA: Diagnosis present

## 2023-06-23 DIAGNOSIS — M6281 Muscle weakness (generalized): Secondary | ICD-10-CM | POA: Diagnosis present

## 2023-06-23 DIAGNOSIS — R2681 Unsteadiness on feet: Secondary | ICD-10-CM | POA: Diagnosis present

## 2023-06-23 NOTE — Therapy (Unsigned)
 OUTPATIENT PHYSICAL THERAPY FEMALE PELVIC EVALUATION   Patient Name: Jacqueline Ortiz MRN: [id number] DOB:03-22-1945, 78 y.o., female Today's Date: 06/23/2023  END OF SESSION:  PT End of Session - 06/23/23 1405     Visit Number 1    Date for PT Re-Evaluation 09/15/23    Authorization Type [employer] medicare    Progress Note Due on Visit 10    PT Start Time 1400    PT Stop Time 1445    PT Time Calculation (min) 45 min    Activity Tolerance Patient tolerated treatment well    Behavior During Therapy WFL for tasks assessed/performed             Past Medical History:  Diagnosis Date   Complete uterovaginal prolapse    Diverticulosis of colon    Dry eyes, bilateral 2000   Duodenal diverticulum    Dyslipidemia 2008   Eczema 1993   GAD (generalized anxiety disorder)    Hemorrhoids 2003   internal and external- remains- treats as needed with topical gels   History of adenomatous polyp of colon    followed by  Dr. Loy Ruff   History of hiatal hernia    large HH  by EGD s/p  [facility] repair and nissen fundoplication 02-06-2016 by dr Melton Squires   History of kidney stones    1980s   Hyperlipidemia 2008   Hypertension 2021   Hypothyroidism    IBS (irritable bowel syndrome)    Malignant neoplasm of overlapping sites of right breast in female, estrogen receptor positive (HCC) 02/2021   oncologist--- dr Lee Public surgeon-- dr Afton Horse;   dx 02/ 2023;  Stage 1A3,  ER/PR+;  HG DCIS,  nodes negative;  04/ 05/ 2023  s/p  right lumpectomy w/ node dissecton's;  completed radiation 06-26-2021;  forego antiestrogen therapy   OA (osteoarthritis)    Osteoporosis 2003   Panic disorder    patient reports anxiety most of her life   Prolapse of vaginal walls    anterior and posterior   S/P dilatation of esophageal stricture 2008   s/p   2008/  2010   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 Loy Ruff   CYSTOSCOPY N/A 04/15/2023   Procedure: CYSTOSCOPY;  Surgeon: Arma Lamp, MD;  Location: [facility] OR;  Service: Gynecology;  Laterality: N/A;   DILATION AND CURETTAGE OF UTERUS     yrs ago   ESOPHAGOGASTRODUODENOSCOPY N/A 10/21/2015   Procedure: ESOPHAGOGASTRODUODENOSCOPY (EGD);  Surgeon: Janel Medford, MD;  Location: [facility];  Service: Endoscopy;  Laterality: N/A;   IR IMAGING GUIDED PORT INSERTION  05/21/2023   LARYNX SURGERY  1993   pt stated due to left vocal cord  paralized (unknown cause) told "splints" placed   PATELLA FRACTURE SURGERY Right 1996   has retained hardware   PERINEOPLASTY N/A 04/15/2023   Procedure: PERINEOPLASTY;  Surgeon: Arma Lamp, MD;  Location: [facility] OR;  Service: Gynecology;  Laterality: N/A;   ROBOTIC ASSISTED LAPAROSCOPIC NISSEN FUNDOPLICATION  02/06/2016   @[facility]  by dr Melton Squires;  ROBOTIC ASSISTED HIATAL HERNIA REPAIR WITH MESH & NISSEN FUNDOPLICATION/  GASTROSTOMY TUBE   ROBOTIC ASSISTED LAPAROSCOPIC SACROCOLPOPEXY N/A 04/15/2023   Procedure: SACROCOLPOPEXY, ROBOT-ASSISTED, LAPAROSCOPIC;  Surgeon: Arma Lamp, MD;  Location: [facility] OR;  Service: Gynecology;  Laterality: N/A;   ROBOTIC ASSISTED TOTAL HYSTERECTOMY WITH BILATERAL SALPINGO OOPHERECTOMY N/A 04/15/2023   Procedure: ROBOTIC  ASSISTED TOTAL HYSTERECTOMY WITH BILATERAL SALPINGO OOPHORECTOMY;  Surgeon: Arma Lamp, MD;  Location: [facility] OR;  Service: Gynecology;  Laterality: N/A;  Total time requested is 3.5hrs   SENTINEL NODE BIOPSY Right 05/01/2021   Procedure: SENTINEL NODE BIOPSY;  Surgeon: Sim Dryer, MD;  Location: [facility] OR;  Service: General;  Laterality: Right;   TONSILLECTOMY  1953   age 11   TUBAL LIGATION Bilateral 1977   Patient Active Problem List   Diagnosis Date Noted   Chronic constipation 05/26/2023   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   HTN (hypertension) 05/25/2022   HLD (hyperlipidemia) 05/25/2022   CAP (community acquired pneumonia) 05/25/2022   Genetic testing 04/12/2021   Family history of melanoma 03/28/2021   Family history of colon cancer 03/28/2021   Malignant neoplasm of overlapping sites of right breast in female, estrogen receptor positive (HCC) 03/05/2021   S/P Nissen fundoplication (with gastrostomy tube placement) (HCC) 02/06/2016   GERD with stricture 06/26/2011   History of colonic polyps 06/26/2011    PCP: Suan Elm, MD  REFERRING PROVIDER: Zuleta, Kaitlin G, NP   REFERRING DIAG:  N81.3 (ICD-10-CM) - Uterovaginal prolapse, complete  K62.3 (ICD-10-CM) - Rectal prolapse  N81.10 (ICD-10-CM) - Prolapse of anterior vaginal wall  N81.6 (ICD-10-CM) - Prolapse of posterior vaginal wall    THERAPY DIAG:  Muscle weakness (generalized)  Gait instability  Aftercare following surgery for neoplasm  Rationale for Evaluation and Treatment: Rehabilitation  ONSET DATE: 04/15/23  SUBJECTIVE:                                                                                                                                                                                           SUBJECTIVE STATEMENT: Prolapse surgery and is 6 weeks out. They found the left fallopian tube cancer. Had her first chemotherapy treatment on 5/8. She will have 6 treatments.   Patient had a CAT scan no no cancer anywhere else.  Fluid intake:  When/What type of surgery did you have? 2. Did you have chemo and/or radiation? If so when started and ended?chemotherapy starts on 06/04/23 for 6 treatments.  3. Do you have neuropathy (if they had chemo?)none 4. Did they take lymph nodes out? If so, how many and what body part? None A.  If you had radiation and/or lymph nodes removed, they are at risk for lymphedema in that quadrant and that should be listed as a precaution B. No heat or ice should be applied to an area previously radiated because of the risk of radiation rebound C. If their arm is at risk for lymphedema, no very repetitive exercises (mainly UBE) because increased blood flow to that body part could bring on swelling (unlikely but you don't want to be the one to bring on lymphedema!) D. No blood pressure checks on the arm at risk for lymphedema E. No ultrasound to anyone with a hx of cancer Further Assess: A. Proximal swelling without distal swelling B. Shortness of breath C. Cellulitis D. Pain with weightbearing  PAIN:  Are you having pain? No NPRS scale: ***/10 Pain location: {pelvic pain location:27098}  Pain type: {type:313116} Pain description: {PAIN DESCRIPTION:21022940}   Aggravating factors: *** Relieving factors: ***  PRECAUTIONS: Other: breast and fallopian tube cancer  RED FLAGS: None   WEIGHT BEARING RESTRICTIONS: No  FALLS:  Has patient fallen in last 6 months? No  OCCUPATION: retired  ACTIVITY LEVEL : walking around the house in circles a few times per day.   PLOF: Independent  PATIENT GOALS: not have to go to the bathroom as much  PERTINENT HISTORY:  Surgery: s/p Robotic assisted total laparoscopic hysterectomy, with bilateral salpingo-oophorectomy, sacrocolpopexy Cleop[facility]), perineorrhaphy, cystoscopy on 04/15/23. Postoperative course complicated by diagnosis of incidental fallopian tube cancer- referred to GYN Onc/  Heme- Onc and planning for chemotherapy.right breast cancer estrogen positive; Osteoporosis; IBS; Hypothyroidism; Scoliosis;   Sexual abuse: No  BOWEL MOVEMENT: tendency to constipation Pain with bowel movement: No Type of bowel movement:Type (Bristol Stool Scale) Type 1,3, 4, Frequency if does not have a bowel movement in 2 days then takes Murelax, and Strain yes but not much Fully empty rectum: No only sometimes Leakage: No Fiber supplement/laxative Yes Murelax  URINATION: Pain with urination: No Fully empty bladder: Yes: has to sit a minute to make sure Stream: varies Urgency: Yes , when constipated has more urgency Frequency: 2-3 hours, gets up 1-2 times at night Leakage: Walking to the bathroom and Coughing, bending forward Pads: Yes: 4-5 pads, sometimes has to change pads due to the stool leakage when she takes stuff for her constipation  INTERCOURSE: was active prior to surgery  Ability to have vaginal penetration Yes  Pain with intercourse: none  PREGNANCY: C-section deliveries 1   PROLAPSE: None   OBJECTIVE:  Note: Objective measures were completed at Evaluation unless otherwise noted.  DIAGNOSTIC FINDINGS:  none  PATIENT SURVEYS:  PFIQ-7: 22 UIQ-7: 29  COGNITION: Overall cognitive status: Within functional limits for tasks assessed     SENSATION: Light touch: Appears intact  LUMBAR SPECIAL TESTS:  {lumbar special test:25242}  FUNCTIONAL TESTS:  5 times sit to stand: 16 sec no hands 2 minute walk test: 222 feet Single leg stance: right 1 sec, left 4 sec  GAIT: Assistive device utilized: None Comments: decreased heel strike on right  POSTURE: scoliosis   LUMBARAROM/PROM: She has scoliosis   LOWER EXTREMITY ROM:  Passive ROM Right eval Left eval  Hip external  rotation 40 40   (Blank rows = not tested)  LOWER EXTREMITY MMT:  MMT Right eval Left eval  Hip flexion 4/5 4/5  Hip extension 4/5 4/5  Hip abduction 3/5 3/5  Knee flexion  4/5 4/5  Knee extension 4/5 4/5  Ankle dorsiflexion 4/5 4/5  Ankle inversion 4/5 4/5  Ankle eversion 4/5 4/5   (Blank rows = not tested) PALPATION:                External Perineal Exam: intact                             Internal Pelvic Floor: tightness around the introitus  Patient confirms identification and approves PT to assess internal pelvic floor and treatment Yes  PELVIC MMT:   MMT eval  Vaginal 1/5 at first pushed therapist finger out but after therapist gave her verbal cues she was able to contract  (Blank rows = not tested)         TODAY'S TREATMENT:                                                                                                                              DATE: 06/23/23  EVAL Examination completed, findings reviewed, pt educated on POC, HEP, and female pelvic floor anatomy, reasoning with pelvic floor assessment internally with pt consent,. Pt motivated to participate in PT and agreeable to attempt recommendations.     PATIENT EDUCATION:  06/23/23 Education details: Access Code: [email or web address] Person educated: Patient Education method: Explanation, Demonstration, Tactile cues, Verbal cues, and Handouts Education comprehension: verbalized understanding, returned demonstration, verbal cues required, tactile cues required, and needs further education  HOME EXERCISE PROGRAM: 06/23/23 Access Code: [email or web address] URL: [email or web address] Date: 06/23/2023 Prepared by: Marsha Skeen  Exercises - Seated Pelvic Floor Contraction  - 4 x daily - 7 x weekly - 1 sets - 5 reps  ASSESSMENT:  CLINICAL IMPRESSION: Patient is a 78 y.o. female who was seen today for physical therapy evaluation and treatment for therapy after vaginal prolaspe surgery. .   OBJECTIVE IMPAIRMENTS: {opptimpairments:25111}.   ACTIVITY LIMITATIONS: {activitylimitations:27494}  PARTICIPATION LIMITATIONS: {participationrestrictions:25113}  PERSONAL FACTORS: {Personal  factors:25162} are also affecting patient's functional outcome.   REHAB POTENTIAL: {rehabpotential:25112}  CLINICAL DECISION MAKING: {clinical decision making:25114}  EVALUATION COMPLEXITY: {Evaluation complexity:25115}   GOALS: Goals reviewed with patient? {yes/no:20286}  SHORT TERM GOALS: Target date: ***  *** Baseline: Goal status: INITIAL  2.  *** Baseline:  Goal status: INITIAL  3.  *** Baseline:  Goal status: INITIAL  4.  *** Baseline:  Goal status: INITIAL  5.  *** Baseline:  Goal status: INITIAL  6.  *** Baseline:  Goal status: INITIAL  LONG TERM GOALS: Target date: 09/15/23  *** Baseline:  Goal status: INITIAL  2.  *** Baseline:  Goal status: INITIAL  3.  *** Baseline:  Goal status: INITIAL  4.  *** Baseline:  Goal status: INITIAL  5.  *** Baseline:  Goal status: INITIAL  6.  *** Baseline:  Goal status: INITIAL  PLAN:  PT FREQUENCY: {rehab frequency:25116}  PT DURATION: {rehab duration:25117}  PLANNED INTERVENTIONS: {rehab planned interventions:25118::"97110-Therapeutic exercises","97530- Therapeutic [phone]- Neuromuscular re-education","97535- Self FAOZ","30865- Manual therapy"}  PLAN FOR NEXT SESSION: ***   Kendric Sindelar, PT 06/23/2023, 3:58 PM

## 2023-06-24 MED FILL — Fosaprepitant Dimeglumine For IV Infusion 150 MG (Base Eq): INTRAVENOUS | Qty: 5 | Status: AC

## 2023-06-25 ENCOUNTER — Inpatient Hospital Stay

## 2023-06-25 ENCOUNTER — Inpatient Hospital Stay: Admitting: Hematology and Oncology

## 2023-06-25 ENCOUNTER — Encounter: Payer: Self-pay | Admitting: Hematology and Oncology

## 2023-06-25 VITALS — BP 138/77 | HR 95 | Temp 98.3°F | Resp 18 | Ht 63.0 in | Wt 126.0 lb

## 2023-06-25 DIAGNOSIS — C5702 Malignant neoplasm of left fallopian tube: Secondary | ICD-10-CM | POA: Diagnosis not present

## 2023-06-25 DIAGNOSIS — M898X9 Other specified disorders of bone, unspecified site: Secondary | ICD-10-CM | POA: Diagnosis not present

## 2023-06-25 DIAGNOSIS — K5909 Other constipation: Secondary | ICD-10-CM | POA: Diagnosis not present

## 2023-06-25 DIAGNOSIS — Z5111 Encounter for antineoplastic chemotherapy: Secondary | ICD-10-CM | POA: Diagnosis not present

## 2023-06-25 LAB — CBC WITH DIFFERENTIAL (CANCER CENTER ONLY)
Abs Immature Granulocytes: 0.03 10*3/uL (ref 0.00–0.07)
Basophils Absolute: 0 10*3/uL (ref 0.0–0.1)
Basophils Relative: 0 %
Eosinophils Absolute: 0 10*3/uL (ref 0.0–0.5)
Eosinophils Relative: 0 %
HCT: 40.2 % (ref 36.0–46.0)
Hemoglobin: 13.2 g/dL (ref 12.0–15.0)
Immature Granulocytes: 1 %
Lymphocytes Relative: 14 %
Lymphs Abs: 0.6 10*3/uL — ABNORMAL LOW (ref 0.7–4.0)
MCH: 29.1 pg (ref 26.0–34.0)
MCHC: 32.8 g/dL (ref 30.0–36.0)
MCV: 88.5 fL (ref 80.0–100.0)
Monocytes Absolute: 0.1 10*3/uL (ref 0.1–1.0)
Monocytes Relative: 1 %
Neutro Abs: 3.6 10*3/uL (ref 1.7–7.7)
Neutrophils Relative %: 84 %
Platelet Count: 193 10*3/uL (ref 150–400)
RBC: 4.54 MIL/uL (ref 3.87–5.11)
RDW: 15.7 % — ABNORMAL HIGH (ref 11.5–15.5)
WBC Count: 4.2 10*3/uL (ref 4.0–10.5)
nRBC: 0 % (ref 0.0–0.2)

## 2023-06-25 LAB — CMP (CANCER CENTER ONLY)
ALT: 19 U/L (ref 0–44)
AST: 18 U/L (ref 15–41)
Albumin: 4.2 g/dL (ref 3.5–5.0)
Alkaline Phosphatase: 93 U/L (ref 38–126)
Anion gap: 8 (ref 5–15)
BUN: 14 mg/dL (ref 8–23)
CO2: 28 mmol/L (ref 22–32)
Calcium: 9.8 mg/dL (ref 8.9–10.3)
Chloride: 103 mmol/L (ref 98–111)
Creatinine: 0.86 mg/dL (ref 0.44–1.00)
GFR, Estimated: >60 mL/min (ref >60)
Glucose, Bld: 182 mg/dL — ABNORMAL HIGH (ref 70–99)
Potassium: 3.7 mmol/L (ref 3.5–5.1)
Sodium: 139 mmol/L (ref 135–145)
Total Bilirubin: 0.3 mg/dL (ref 0.0–1.2)
Total Protein: 7 g/dL (ref 6.5–8.1)

## 2023-06-25 MED ORDER — HEPARIN SOD (PORK) LOCK FLUSH 100 UNIT/ML IV SOLN
500.0000 [IU] | Freq: Once | INTRAVENOUS | Status: DC | PRN
Start: 2023-06-25 — End: 2023-06-25

## 2023-06-25 MED ORDER — SODIUM CHLORIDE 0.9% FLUSH: 10.0000 mL | INTRAVENOUS | Status: DC | PRN

## 2023-06-25 MED ORDER — SODIUM CHLORIDE 0.9 % IV SOLN
150.0000 mg | Freq: Once | INTRAVENOUS | Status: AC
  Administered 2023-06-25: 150 mg via INTRAVENOUS
  Filled 2023-06-25: qty 150

## 2023-06-25 MED ORDER — FAMOTIDINE IN NACL 20-0.9 MG/50ML-% IV SOLN
20.0000 mg | Freq: Once | INTRAVENOUS | Status: AC
  Administered 2023-06-25: 20 mg via INTRAVENOUS
  Filled 2023-06-25: qty 50

## 2023-06-25 MED ORDER — SODIUM CHLORIDE 0.9 % IV SOLN
175.0000 mg/m2 | Freq: Once | INTRAVENOUS | Status: AC
  Administered 2023-06-25: 276 mg via INTRAVENOUS
  Filled 2023-06-25: qty 46

## 2023-06-25 MED ORDER — SODIUM CHLORIDE 0.9 % IV SOLN
411.6000 mg | Freq: Once | INTRAVENOUS | Status: AC
  Administered 2023-06-25: 410 mg via INTRAVENOUS
  Filled 2023-06-25: qty 41

## 2023-06-25 MED ORDER — SODIUM CHLORIDE 0.9 % IV SOLN: INTRAVENOUS | Status: DC

## 2023-06-25 MED ORDER — PALONOSETRON HCL INJECTION 0.25 MG/5ML
0.2500 mg | Freq: Once | INTRAVENOUS | Status: AC
  Administered 2023-06-25: 0.25 mg via INTRAVENOUS
  Filled 2023-06-25: qty 5

## 2023-06-25 MED ORDER — CETIRIZINE HCL 10 MG/ML IV SOLN
10.0000 mg | Freq: Once | INTRAVENOUS | Status: AC
  Administered 2023-06-25: 10 mg via INTRAVENOUS
  Filled 2023-06-25: qty 1

## 2023-06-25 MED ORDER — SODIUM CHLORIDE 0.9% FLUSH
10.0000 mL | Freq: Once | INTRAVENOUS | Status: AC
  Administered 2023-06-25: 10 mL

## 2023-06-25 MED ORDER — DEXAMETHASONE SODIUM PHOSPHATE 10 MG/ML IJ SOLN
10.0000 mg | Freq: Once | INTRAMUSCULAR | Status: AC
  Administered 2023-06-25: 10 mg via INTRAVENOUS
  Filled 2023-06-25: qty 1

## 2023-06-25 NOTE — Patient Instructions (Signed)

## 2023-06-25 NOTE — Assessment & Plan Note (Addendum)
 We discussed importance of aggressive laxative therapy especially the first 3 days after chemotherapy

## 2023-06-25 NOTE — Assessment & Plan Note (Addendum)
 This is an expected side effect from chemotherapy She will continue acetaminophen  as needed

## 2023-06-25 NOTE — Assessment & Plan Note (Addendum)
 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, stage II disease status post optimal debulking surgery  Final pathology high-grade serous, p53 mutated, Tumor marker was not helpful  She tolerated cycle 1 of chemotherapy well except for some excessive fatigue, bone aches and constipation which are self-limiting We will proceed with treatment cycle 2 today without delay We discussed additional measures to prevent side effects/complications in the future

## 2023-06-25 NOTE — Progress Notes (Signed)
 [facility] OFFICE PROGRESS NOTE  Patient Care Team: Suan Elm, MD as PCP - General (Internal Medicine)  Assessment & Plan Cancer of left fallopian tube [facility]) 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, stage II disease status post optimal debulking surgery  Final pathology high-grade serous, p53 mutated, Tumor marker was not helpful  She tolerated cycle 1 of chemotherapy well except for some excessive fatigue, bone aches and constipation which are self-limiting We will proceed with treatment cycle 2 today without delay We discussed additional measures to prevent side effects/complications in the future  Chronic constipation We discussed importance of aggressive laxative therapy especially the first 3 days after chemotherapy Bone aches This is an expected side effect from chemotherapy She will continue acetaminophen  as needed  No orders of the defined types were placed in this encounter.    Jacqueline Jacobs, MD  INTERVAL HISTORY: she returns for treatment follow-up Complications related to previous cycle of chemotherapy included constipation,, bone aches,, and fatigue, She denies peripheral neuropathy and uses cryotherapy to prevent neuropathy No recent nausea  PHYSICAL EXAMINATION: ECOG PERFORMANCE STATUS: 1 - Symptomatic but completely ambulatory  Lab Results  Component Value Date   CAN125 17.3 05/11/2023      Latest Ref Rng & Units 06/25/2023    8:04 AM 06/02/2023   10:34 AM 05/11/2023   12:39 PM  CBC  WBC 4.0 - 10.5 K/uL 4.2  7.1  4.9   Hemoglobin 12.0 - 15.0 g/dL 16.1  09.6  04.5   Hematocrit 36.0 - 46.0 % 40.2  40.1  42.2   Platelets 150 - 400 K/uL 193  208  214       Chemistry      Component Value Date/Time   NA 139 06/02/2023 1034   NA 139 04/13/2023 1133   K 3.8 06/02/2023 1034   CL 104 06/02/2023 1034   CO2 30 06/02/2023 1034   BUN 13 06/02/2023 1034    BUN 11 04/13/2023 1133   CREATININE 0.94 06/02/2023 1034      Component Value Date/Time   CALCIUM  9.6 06/02/2023 1034   ALKPHOS 77 06/02/2023 1034   AST 18 06/02/2023 1034   ALT 13 06/02/2023 1034   BILITOT 0.4 06/02/2023 1034       Vitals:   06/25/23 0825  BP: 138/77  Pulse: 95  Resp: 18  Temp: 98.3 F (36.8 C)  SpO2: 97%   Filed Weights   06/25/23 0825  Weight: 126 lb (57.2 kg)   Other relevant data reviewed during this visit included CBC and CMP

## 2023-06-30 ENCOUNTER — Encounter: Admitting: Physical Therapy

## 2023-07-06 ENCOUNTER — Telehealth: Payer: Self-pay

## 2023-07-06 NOTE — Telephone Encounter (Signed)
 Returned her call. She is complaining of constipation, last bm Friday. She took milk of magnesia daily on 6/7 and 6/8 with no results. Instructed to take Miralax  bid and senokot-s 2 tabs TID starting now and call the office back in the am with update. She verbalized understanding.

## 2023-07-06 NOTE — Telephone Encounter (Signed)
 Returned her call and left a message to call the office back if needed.

## 2023-07-07 ENCOUNTER — Encounter: Payer: Self-pay | Admitting: Physical Therapy

## 2023-07-07 ENCOUNTER — Encounter: Attending: Obstetrics and Gynecology | Admitting: Physical Therapy

## 2023-07-07 ENCOUNTER — Telehealth: Payer: Self-pay

## 2023-07-07 DIAGNOSIS — R2681 Unsteadiness on feet: Secondary | ICD-10-CM | POA: Diagnosis present

## 2023-07-07 DIAGNOSIS — Z483 Aftercare following surgery for neoplasm: Secondary | ICD-10-CM | POA: Insufficient documentation

## 2023-07-07 DIAGNOSIS — M6281 Muscle weakness (generalized): Secondary | ICD-10-CM | POA: Insufficient documentation

## 2023-07-07 NOTE — Therapy (Signed)
 OUTPATIENT PHYSICAL THERAPY FEMALE PELVIC TREATMENT   Patient Name: Jacqueline Ortiz MRN: [id number] DOB:Feb 14, 1945, 78 y.o., female Today's Date: 07/07/2023  END OF SESSION:  PT End of Session - 07/07/23 1407     Visit Number 2    Date for PT Re-Evaluation 09/15/23    Authorization Type [employer] medicare    Authorization Time Period 06/23/2023 - 09/15/2023    Authorization - Number of Visits 12    Progress Note Due on Visit 10    PT Start Time 1400    PT Stop Time 1445    PT Time Calculation (min) 45 min    Activity Tolerance Patient tolerated treatment well    Behavior During Therapy [facility] for tasks assessed/performed             Past Medical History:  Diagnosis Date   Complete uterovaginal prolapse    Diverticulosis of colon    Dry eyes, bilateral 2000   Duodenal diverticulum    Dyslipidemia 2008   Eczema 1993   GAD (generalized anxiety disorder)    Hemorrhoids 2003   internal and external- remains- treats as needed with topical gels   History of adenomatous polyp of colon    followed by  Dr. Loy Ruff   History of hiatal hernia    large HH  by EGD s/p  [facility] repair and nissen fundoplication 02-06-2016 by dr Melton Squires   History of kidney stones    1980s   Hyperlipidemia 2008   Hypertension 2021   Hypothyroidism    IBS (irritable bowel syndrome)    Malignant neoplasm of overlapping sites of right breast in female, estrogen receptor positive (HCC) 02/2021   oncologist--- dr Lee Public surgeon-- dr Afton Horse;   dx 02/ 2023;  Stage 1A3,  ER/PR+;  HG DCIS,  nodes negative;  04/ 05/ 2023  s/p  right lumpectomy w/ node dissecton's;  completed radiation 06-26-2021;  forego antiestrogen therapy   OA (osteoarthritis)    Osteoporosis 2003   Panic disorder    patient reports anxiety most of her life   Prolapse of vaginal walls    anterior and posterior   S/P dilatation of esophageal stricture 2008   s/p   2008/  2010   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 Loy Ruff   CYSTOSCOPY N/A 04/15/2023   Procedure: CYSTOSCOPY;  Surgeon: Arma Lamp, MD;  Location: [facility] OR;  Service: Gynecology;  Laterality: N/A;   DILATION AND CURETTAGE OF UTERUS     yrs ago   ESOPHAGOGASTRODUODENOSCOPY N/A 10/21/2015   Procedure: ESOPHAGOGASTRODUODENOSCOPY (EGD);  Surgeon: Janel Medford, MD;  Location: [facility];  Service: Endoscopy;  Laterality: N/A;   IR IMAGING GUIDED PORT  INSERTION  05/21/2023   LARYNX SURGERY  1993   pt stated due to left vocal cord paralized (unknown cause) told "splints" placed   PATELLA FRACTURE SURGERY Right 1996   has retained hardware   PERINEOPLASTY N/A 04/15/2023   Procedure: PERINEOPLASTY;  Surgeon: Arma Lamp, MD;  Location: [facility] OR;  Service: Gynecology;  Laterality: N/A;   ROBOTIC ASSISTED LAPAROSCOPIC NISSEN FUNDOPLICATION  02/06/2016   @[facility]  by dr Melton Squires;  ROBOTIC ASSISTED HIATAL HERNIA REPAIR WITH MESH & NISSEN FUNDOPLICATION/  GASTROSTOMY TUBE   ROBOTIC ASSISTED LAPAROSCOPIC SACROCOLPOPEXY N/A 04/15/2023   Procedure: SACROCOLPOPEXY, ROBOT-ASSISTED, LAPAROSCOPIC;  Surgeon: Arma Lamp, MD;  Location: [facility] OR;  Service: Gynecology;  Laterality: N/A;   ROBOTIC ASSISTED  TOTAL HYSTERECTOMY WITH BILATERAL SALPINGO OOPHERECTOMY N/A 04/15/2023   Procedure: ROBOTIC ASSISTED TOTAL HYSTERECTOMY WITH BILATERAL SALPINGO OOPHORECTOMY;  Surgeon: Arma Lamp, MD;  Location: [facility] OR;  Service: Gynecology;  Laterality: N/A;  Total time requested is 3.5hrs   SENTINEL NODE BIOPSY Right 05/01/2021   Procedure: SENTINEL NODE BIOPSY;  Surgeon: Sim Dryer, MD;  Location: [facility] OR;  Service: General;  Laterality: Right;   TONSILLECTOMY  1953   age 15   TUBAL LIGATION Bilateral 1977   Patient Active Problem List   Diagnosis Date Noted   Bone aches 06/25/2023   Chronic constipation 05/26/2023   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   HTN (hypertension) 05/25/2022   HLD (hyperlipidemia) 05/25/2022   CAP (community acquired pneumonia) 05/25/2022   Genetic testing 04/12/2021   Family history of melanoma 03/28/2021   Family history of colon cancer 03/28/2021   Malignant neoplasm of overlapping sites of right breast in female, estrogen receptor positive (HCC) 03/05/2021   S/P Nissen fundoplication (with gastrostomy tube placement) (HCC) 02/06/2016   GERD with stricture 06/26/2011   History of colonic polyps 06/26/2011    PCP: Suan Elm, MD  REFERRING PROVIDER: Zuleta, Kaitlin G, NP   REFERRING DIAG:  N81.3 (ICD-10-CM) - Uterovaginal prolapse, complete  K62.3 (ICD-10-CM) - Rectal prolapse  N81.10 (ICD-10-CM) - Prolapse of anterior vaginal wall  N81.6 (ICD-10-CM) - Prolapse of posterior vaginal wall    THERAPY DIAG:  Muscle weakness (generalized)  Gait instability  Aftercare following surgery for neoplasm  Rationale for Evaluation and Treatment: Rehabilitation  ONSET DATE: 04/15/23  SUBJECTIVE:                                                                                                                                                                                           SUBJECTIVE STATEMENT: I am having constipation from my  infusion.  Prolapse surgery and is 6 weeks  out. They found the left fallopian tube cancer. Had her first chemotherapy treatment on 5/8. She will have 6 treatments.  Patient had a CAT scan no no cancer anywhere else.  Fluid intake:  When/What type of surgery did you have? 2. Did you have chemo and/or radiation? If so when started and ended?chemotherapy starts on 06/04/23 for 6 treatments.  3. Do you have neuropathy (if they had chemo?)none 4. Did they take lymph nodes out? If so, how many and what body part? None A.  If you had radiation and/or lymph nodes removed, they are at risk for lymphedema in that quadrant and that should be listed as a precaution B. No heat or ice should be applied to an area previously radiated because of the risk of radiation rebound C. If their arm is at risk for lymphedema, no very repetitive exercises (mainly UBE) because increased blood flow to that body part could bring on swelling (unlikely but you don't want to be the one to bring on lymphedema!) D. No blood pressure checks on the arm at risk for lymphedema E. No ultrasound to anyone with a hx of cancer Further Assess: A. Proximal swelling without distal swelling B. Shortness of breath C. Cellulitis D. Pain with weightbearing  PAIN:  Are you having pain? No  PRECAUTIONS: Other: breast and fallopian tube cancer  RED FLAGS: None   WEIGHT BEARING RESTRICTIONS: No  FALLS:  Has patient fallen in last 6 months? No  OCCUPATION: retired  ACTIVITY LEVEL : walking around the house in circles a few times per day.   PLOF: Independent  PATIENT GOALS: not have to go to the bathroom as much  PERTINENT HISTORY:  Surgery: s/p Robotic assisted total laparoscopic hysterectomy, with bilateral salpingo-oophorectomy, sacrocolpopexy Cleopatra Dada Lite Y), perineorrhaphy, cystoscopy on 04/15/23. Postoperative course complicated by diagnosis of incidental fallopian tube cancer- referred to GYN Onc/ Heme- Onc and planning for  chemotherapy.right breast cancer estrogen positive; Osteoporosis; IBS; Hypothyroidism; Scoliosis;   Sexual abuse: No  BOWEL MOVEMENT: tendency to constipation Pain with bowel movement: No Type of bowel movement:Type (Bristol Stool Scale) Type 1,3, 4, Frequency if does not have a bowel movement in 2 days then takes Murelax, and Strain yes but not much Fully empty rectum: No only sometimes Leakage: No Fiber supplement/laxative Yes Murelax  URINATION: Pain with urination: No Fully empty bladder: Yes: has to sit a minute to make sure Stream: varies Urgency: Yes , when constipated has more urgency Frequency: 2-3 hours, gets up 1-2 times at night Leakage: Walking to the bathroom and Coughing, bending forward Pads: Yes: 4-5 pads, sometimes has to change pads due to the stool leakage when she takes stuff for her constipation  INTERCOURSE: was active prior to surgery  Ability to have vaginal penetration Yes  Pain with intercourse: none  PREGNANCY: C-section deliveries 1   PROLAPSE: None   OBJECTIVE:  Note: Objective measures were completed at Evaluation unless otherwise noted.  DIAGNOSTIC FINDINGS:  none  PATIENT SURVEYS:  PFIQ-7: 22 UIQ-7: 29  COGNITION: Overall cognitive status: Within functional limits for tasks assessed     SENSATION: Light touch: Appears intact   FUNCTIONAL TESTS:  5 times sit to stand: 16 sec no hands 2 minute walk test: 222 feet Single leg stance: right 1 sec, left 4 sec 07/07/23: 2 minute walk test: 306 feet  GAIT: Assistive device utilized: None Comments: decreased heel strike on right  POSTURE: scoliosis   LUMBARAROM/PROM: She has scoliosis   LOWER EXTREMITY ROM:  Passive  ROM Right eval Left eval  Hip external rotation 40 40   (Blank rows = not tested)  LOWER EXTREMITY MMT:  MMT Right eval Left eval  Hip flexion 4/5 4/5  Hip extension 4/5 4/5  Hip abduction 3/5 3/5  Knee flexion 4/5 4/5  Knee extension 4/5 4/5  Ankle  dorsiflexion 4/5 4/5  Ankle inversion 4/5 4/5  Ankle eversion 4/5 4/5   (Blank rows = not tested) PALPATION:                External Perineal Exam: intact                             Internal Pelvic Floor: tightness around the introitus  Patient confirms identification and approves PT to assess internal pelvic floor and treatment Yes  PELVIC MMT:   MMT eval  Vaginal 1/5 at first pushed therapist finger out but after therapist gave her verbal cues she was able to contract  (Blank rows = not tested)         TODAY'S TREATMENT:        07/07/23 Manual: Soft tissue mobilization: Circular massage to the abdomen to promote peristalic motion of the intestines Manual work to the diaphragm to elongate and prepare for diaphragmatic breathing Myofascial release: Fascial release around the lower abdomen to release indirectly by the colon and bladder Neuromuscular re-education: Down training: Diaphragmatic breathing with tactile cues to open up the lower rib cage in supine and sitting Supine opening up one side of the rib cage with tactile cues to push into therapist hands Exercises: Strengthening: Sit to stand 2 x 4 Walking 2 minutes for endurance Therapeutic activities: Functional strengthening activities: Education on how to sit on commode with knees higher than hips, diaphragmatic breathing to relax the pelvic floor, blowing out to relax the anus and generate force to push stool out                                                                                                                          DATE: 06/23/23  EVAL Examination completed, findings reviewed, pt educated on POC, HEP, and female pelvic floor anatomy, reasoning with pelvic floor assessment internally with pt consent,. Pt motivated to participate in PT and agreeable to attempt recommendations.     PATIENT EDUCATION:  07/07/23 Education details: Access Code: [email or web address], education on abdominal massage and  toileting Person educated: Patient Education method: Explanation, Demonstration, Tactile cues, Verbal cues, and Handouts Education comprehension: verbalized understanding, returned demonstration, verbal cues required, tactile cues required, and needs further education  HOME EXERCISE PROGRAM: 07/07/23 Access Code: [email or web address] URL: [email or web address] Date: 07/07/2023 Prepared by: Marsha Skeen  Exercises - Seated Pelvic Floor Contraction  - 4 x daily - 7 x weekly - 1 sets - 5 reps - Supine Diaphragmatic Breathing  - 1 x daily - 7 x weekly - 3 sets - 10 reps - Seated Diaphragmatic Breathing  -  1 x daily - 7 x weekly - 3 sets - 10 reps  Patient Education - Abdominal Massage for Constipation - Abdominal Massage for Constipation  ASSESSMENT:  CLINICAL IMPRESSION: Patient is a 78 y.o. female who was seen today for physical therapy  treatment for therapy after vaginal prolaspe surgery on 04/15/23. She then was diagnosed with left fallopian tube cancer. She started chemotherapy on 06/04/23 and will have 6 treatments.  Patient will have difficulty with constipation after her chemotherapy treatment. She was able to feel her pelvic floor relax with diaphragmatic breathing. She was able to walk further with the 2 minute walk test.  Patient would benefit from skilled therapy to improve pelvic floor strength and overall strength.   OBJECTIVE IMPAIRMENTS: Abnormal gait, decreased activity tolerance, decreased balance, decreased coordination, difficulty walking, decreased strength, increased fascial restrictions, and increased muscle spasms.   ACTIVITY LIMITATIONS: lifting, bending, bed mobility, continence, and locomotion level  PARTICIPATION LIMITATIONS: cleaning, laundry, shopping, and community activity  PERSONAL FACTORS: Age, Time since onset of injury/illness/exacerbation, and 3+ comorbidities: Surgery: s/p Robotic assisted total laparoscopic hysterectomy, with bilateral  salpingo-oophorectomy, sacrocolpopexy Cleopatra Dada Lite Y), perineorrhaphy, cystoscopy on 04/15/23. Postoperative course complicated by diagnosis of incidental fallopian tube cancer- referred to GYN Onc/ Heme- Onc and planning for chemotherapy.right breast cancer estrogen positive; Osteoporosis; IBS; Hypothyroidism; Scoliosis;   are also affecting patient's functional outcome.   REHAB POTENTIAL: Good  CLINICAL DECISION MAKING: Evolving/moderate complexity  EVALUATION COMPLEXITY: Moderate   GOALS: Goals reviewed with patient? Yes  SHORT TERM GOALS: Target date: 07/21/23  Patient independent with initial HEP for pelvic floor strength.  Baseline: Goal status: INITIAL  2.  Patient able to perform diaphragmatic breathing to elongate the pelvic floor.  Baseline:  Goal status: Met 07/07/23  3.  Patient educated on urgency to void behavioral technique.  Baseline:  Goal status: INITIAL   LONG TERM GOALS: Target date: 09/15/23  Patient independent with advanced HEP for core, pelvic floor and overall strength.  Baseline:  Goal status: INITIAL  2.  Patient reports she wears 1 or less pads per days due to reduction of urinary leakage with walking.  Baseline:  Goal status: INITIAL  3.  Patient is able to walk to the bathroom with no urinary leakage due to using the behavioral techniques and increased in pelvic floor strength.  Baseline:  Goal status: INITIAL  4.  Patient is able to walk >/= 462 feet in 2 minutes to reduce the risk of falling as she is walking to the bathroom.  Baseline:  Goal status: INITIAL  5.  Sit to stand 5 times </= 12.6 seconds to assist getting up from chair safely to walk to the bathroom when she has urgency.  Baseline:  Goal status: INITIAL   PLAN:  PT FREQUENCY: 1-2x/week  PT DURATION: 12 weeks  PLANNED INTERVENTIONS: 97110-Therapeutic exercises, 97530- Therapeutic activity, 97112- Neuromuscular re-education, 97535- Self Care, 16109- Manual therapy,  Patient/Family education, Balance training, Dry Needling, and Biofeedback  PLAN FOR NEXT SESSION: 2 minutes walk test, manual work to pelvic floor to elongate the tissue, sit to stand, hip strength   Marsha Skeen, PT 07/07/23 2:55 PM

## 2023-07-07 NOTE — Patient Instructions (Signed)

## 2023-07-07 NOTE — Telephone Encounter (Signed)
 Returned her call and constipation is better. She has had multiple bm's and feels better. She is still feeling a little constipated and will continue laxatives as needed. She will call the office back for questions/ concerns.  FYI

## 2023-07-14 ENCOUNTER — Encounter: Admitting: Physical Therapy

## 2023-07-14 ENCOUNTER — Encounter: Payer: Self-pay | Admitting: Physical Therapy

## 2023-07-14 DIAGNOSIS — Z483 Aftercare following surgery for neoplasm: Secondary | ICD-10-CM

## 2023-07-14 DIAGNOSIS — R2681 Unsteadiness on feet: Secondary | ICD-10-CM

## 2023-07-14 DIAGNOSIS — M6281 Muscle weakness (generalized): Secondary | ICD-10-CM

## 2023-07-14 NOTE — Therapy (Signed)
 OUTPATIENT PHYSICAL THERAPY FEMALE PELVIC TREATMENT   Patient Name: Jacqueline Ortiz MRN: [id number] DOB:[birth year], 78 y.o., female Today's Date: 07/14/2023  END OF SESSION:  PT End of Session - 07/14/23 1406     Visit Number 3    Date for PT Re-Evaluation 09/15/23    Authorization Type [employer] medicare    Authorization Time Period 06/23/2023 - 09/15/2023    Authorization - Number of Visits 3    Progress Note Due on Visit 10    PT Start Time 1400    PT Stop Time 1445    PT Time Calculation (min) 45 min    Activity Tolerance Patient tolerated treatment well    Behavior During Therapy [facility] for tasks assessed/performed          Past Medical History:  Diagnosis Date   Complete uterovaginal prolapse    Diverticulosis of colon    Dry eyes, bilateral 2000   Duodenal diverticulum    Dyslipidemia 2008   Eczema 1993   GAD (generalized anxiety disorder)    Hemorrhoids 2003   internal and external- remains- treats as needed with topical gels   History of adenomatous polyp of colon    followed by  Dr. Loy Ruff   History of hiatal hernia    large HH  by EGD s/p  [facility] repair and nissen fundoplication 02-06-2016 by dr Melton Squires   History of kidney stones    1980s   Hyperlipidemia 2008   Hypertension 2021   Hypothyroidism    IBS (irritable bowel syndrome)    Malignant neoplasm of overlapping sites of right breast in female, estrogen receptor positive (HCC) 02/2021   oncologist--- dr Lee Public surgeon-- dr Afton Horse;   dx 02/ 2023;  Stage 1A3,  ER/PR+;  HG DCIS,  nodes negative;  04/ 05/ 2023  s/p  right lumpectomy w/ node dissecton's;  completed radiation 06-26-2021;  forego antiestrogen therapy   OA (osteoarthritis)    Osteoporosis 2003   Panic disorder    patient reports anxiety most of her life   Prolapse of vaginal walls    anterior and posterior   S/P dilatation of esophageal stricture 2008   s/p   2008/  2010   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 Loy Ruff   CYSTOSCOPY N/A 04/15/2023   Procedure: CYSTOSCOPY;  Surgeon: Arma Lamp, MD;  Location: [facility] OR;  Service: Gynecology;  Laterality: N/A;   DILATION AND CURETTAGE OF UTERUS     yrs ago   ESOPHAGOGASTRODUODENOSCOPY N/A 10/21/2015   Procedure: ESOPHAGOGASTRODUODENOSCOPY (EGD);  Surgeon: Janel Medford, MD;  Location: [facility];  Service: Endoscopy;  Laterality: N/A;   IR IMAGING GUIDED PORT INSERTION  05/21/2023  LARYNX SURGERY  1993   pt stated due to left vocal cord paralized (unknown cause) told splints placed   PATELLA FRACTURE SURGERY Right 1996   has retained hardware   PERINEOPLASTY N/A 04/15/2023   Procedure: PERINEOPLASTY;  Surgeon: Arma Lamp, MD;  Location: [facility] OR;  Service: Gynecology;  Laterality: N/A;   ROBOTIC ASSISTED LAPAROSCOPIC NISSEN FUNDOPLICATION  02/06/2016   @[facility]  by dr Melton Squires;  ROBOTIC ASSISTED HIATAL HERNIA REPAIR WITH MESH & NISSEN FUNDOPLICATION/  GASTROSTOMY TUBE   ROBOTIC ASSISTED LAPAROSCOPIC SACROCOLPOPEXY N/A 04/15/2023   Procedure: SACROCOLPOPEXY, ROBOT-ASSISTED, LAPAROSCOPIC;  Surgeon: Arma Lamp, MD;  Location: [facility] OR;  Service: Gynecology;  Laterality: N/A;   ROBOTIC ASSISTED TOTAL  HYSTERECTOMY WITH BILATERAL SALPINGO OOPHERECTOMY N/A 04/15/2023   Procedure: ROBOTIC ASSISTED TOTAL HYSTERECTOMY WITH BILATERAL SALPINGO OOPHORECTOMY;  Surgeon: Arma Lamp, MD;  Location: [facility] OR;  Service: Gynecology;  Laterality: N/A;  Total time requested is 3.5hrs   SENTINEL NODE BIOPSY Right 05/01/2021   Procedure: SENTINEL NODE BIOPSY;  Surgeon: Sim Dryer, MD;  Location: [facility] OR;  Service: General;  Laterality: Right;   TONSILLECTOMY  1953   age 63   TUBAL LIGATION Bilateral 1977   Patient Active Problem List   Diagnosis Date Noted   Bone aches 06/25/2023   Chronic constipation 05/26/2023   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   HTN (hypertension) 05/25/2022   HLD (hyperlipidemia) 05/25/2022   CAP (community acquired pneumonia) 05/25/2022   Genetic testing 04/12/2021   Family history of melanoma 03/28/2021   Family history of colon cancer 03/28/2021   Malignant neoplasm of overlapping sites of right breast in female, estrogen receptor positive (HCC) 03/05/2021   S/P Nissen fundoplication (with gastrostomy tube placement) (HCC) 02/06/2016   GERD with stricture 06/26/2011   History of colonic polyps 06/26/2011    PCP: Suan Elm, MD  REFERRING PROVIDER: Zuleta, Kaitlin G, NP   REFERRING DIAG:  N81.3 (ICD-10-CM) - Uterovaginal prolapse, complete  K62.3 (ICD-10-CM) - Rectal prolapse  N81.10 (ICD-10-CM) - Prolapse of anterior vaginal wall  N81.6 (ICD-10-CM) - Prolapse of posterior vaginal wall    THERAPY DIAG:  Muscle weakness (generalized)  Gait instability  Aftercare following surgery for neoplasm  Rationale for Evaluation and Treatment: Rehabilitation  ONSET DATE: 04/15/23  SUBJECTIVE:                                                                                                                                                                                           SUBJECTIVE STATEMENT:  .  Prolapse surgery and is 6 weeks out. They  found the left fallopian tube cancer. Had her  first chemotherapy treatment on 5/8. She will have 6 treatments.  Patient had a CAT scan no no cancer anywhere else.  Fluid intake:  When/What type of surgery did you have? 2. Did you have chemo and/or radiation? If so when started and ended?chemotherapy starts on 06/04/23 for 6 treatments.  3. Do you have neuropathy (if they had chemo?)none 4. Did they take lymph nodes out? If so, how many and what body part? None A.  If you had radiation and/or lymph nodes removed, they are at risk for lymphedema in that quadrant and that should be listed as a precaution B. No heat or ice should be applied to an area previously radiated because of the risk of radiation rebound C. If their arm is at risk for lymphedema, no very repetitive exercises (mainly UBE) because increased blood flow to that body part could bring on swelling (unlikely but you don't want to be the one to bring on lymphedema!) D. No blood pressure checks on the arm at risk for lymphedema E. No ultrasound to anyone with a hx of cancer Further Assess: A. Proximal swelling without distal swelling B. Shortness of breath C. Cellulitis D. Pain with weightbearing  PAIN:  Are you having pain? No  PRECAUTIONS: Other: breast and fallopian tube cancer  RED FLAGS: None   WEIGHT BEARING RESTRICTIONS: No  FALLS:  Has patient fallen in last 6 months? No  OCCUPATION: retired  ACTIVITY LEVEL : walking around the house in circles a few times per day.   PLOF: Independent  PATIENT GOALS: not have to go to the bathroom as much  PERTINENT HISTORY:  Surgery: s/p Robotic assisted total laparoscopic hysterectomy, with bilateral salpingo-oophorectomy, sacrocolpopexy Cleopatra Dada Lite Y), perineorrhaphy, cystoscopy on 04/15/23. Postoperative course complicated by diagnosis of incidental fallopian tube cancer- referred to GYN Onc/ Heme- Onc and planning for chemotherapy.right breast cancer estrogen positive;  Osteoporosis; IBS; Hypothyroidism; Scoliosis;   Sexual abuse: No  BOWEL MOVEMENT: tendency to constipation Pain with bowel movement: No Type of bowel movement:Type (Bristol Stool Scale) Type 1,3, 4, Frequency if does not have a bowel movement in 2 days then takes Murelax, and Strain yes but not much Fully empty rectum: No only sometimes Leakage: No Fiber supplement/laxative Yes Murelax  URINATION: Pain with urination: No Fully empty bladder: Yes: has to sit a minute to make sure Stream: varies Urgency: Yes , when constipated has more urgency Frequency: 2-3 hours, gets up 1-2 times at night Leakage: Walking to the bathroom and Coughing, bending forward Pads: Yes: 4-5 pads, sometimes has to change pads due to the stool leakage when she takes stuff for her constipation  INTERCOURSE: was active prior to surgery  Ability to have vaginal penetration Yes  Pain with intercourse: none  PREGNANCY: C-section deliveries 1   PROLAPSE: None   OBJECTIVE:  Note: Objective measures were completed at Evaluation unless otherwise noted.  DIAGNOSTIC FINDINGS:  none  PATIENT SURVEYS:  PFIQ-7: 22 UIQ-7: 29  COGNITION: Overall cognitive status: Within functional limits for tasks assessed     SENSATION: Light touch: Appears intact   FUNCTIONAL TESTS:  5 times sit to stand: 16 sec no hands 2 minute walk test: 222 feet Single leg stance: right 1 sec, left 4 sec 07/07/23: 2 minute walk test: 306 feet 07/14/23: 2 minute walk test: 340 feet  GAIT: Assistive device utilized: None Comments: decreased heel strike on right  POSTURE: scoliosis   LUMBARAROM/PROM: She has scoliosis   LOWER EXTREMITY ROM:  Passive ROM Right eval  Left eval  Hip external rotation 40 40   (Blank rows = not tested)  LOWER EXTREMITY MMT:  MMT Right eval Left eval  Hip flexion 4/5 4/5  Hip extension 4/5 4/5  Hip abduction 3/5 3/5  Knee flexion 4/5 4/5  Knee extension 4/5 4/5  Ankle dorsiflexion  4/5 4/5  Ankle inversion 4/5 4/5  Ankle eversion 4/5 4/5   (Blank rows = not tested) PALPATION:                External Perineal Exam: intact                             Internal Pelvic Floor: tightness around the introitus  Patient confirms identification and approves PT to assess internal pelvic floor and treatment Yes  PELVIC MMT:   MMT eval 07/14/23  Vaginal 1/5 at first pushed therapist finger out but after therapist gave her verbal cues she was able to contract 2/5 with slight circular hug of therapist finger  (Blank rows = not tested)         TODAY'S TREATMENT:   07/14/23 Manual: Soft tissue mobilization: Manual mobilization of the perineal body going through the restrictions Manual mobilization along the ischiocavernosus to elongate Internal pelvic floor techniques: No emotional/communication barriers or cognitive limitation. Patient is motivated to learn. Patient understands and agrees with treatment goals and plan. PT explains patient will be examined in standing, sitting, and lying down to see how their muscles and joints work. When they are ready, they will be asked to remove their underwear so PT can examine their perineum. The patient is also given the option of providing their own chaperone as one is not provided in our facility. The patient also has the right and is explained the right to defer or refuse any part of the evaluation or treatment including the internal exam. With the patient's consent, PT will use one gloved finger to gently assess the muscles of the pelvic floor, seeing how well it contracts and relaxes and if there is muscle symmetry. After, the patient will get dressed and PT and patient will discuss exam findings and plan of care. PT and patient discuss plan of care, schedule, attendance policy and HEP activities.  Therapist gloved finger in the vaginal canal working on the perineal body, sides of the introitus, levator ani and obturator internist to  elongate for better quality contraction Neuromuscular re-education: Pelvic floor contraction training: Therapist gloved finger in the vaginal canal giving tactile cues to the pelvic floor muscles for contraction and breathing out with contraction Sitting pelvic floor contraction with breathing out Exercises: Strengthening: 2 minute walk test for 340 feet         07/07/23 Manual: Soft tissue mobilization: Circular massage to the abdomen to promote peristalic motion of the intestines Manual work to the diaphragm to elongate and prepare for diaphragmatic breathing Myofascial release: Fascial release around the lower abdomen to release indirectly by the colon and bladder Neuromuscular re-education: Down training: Diaphragmatic breathing with tactile cues to open up the lower rib cage in supine and sitting Supine opening up one side of the rib cage with tactile cues to push into therapist hands Exercises: Strengthening: Sit to stand 2 x 4 Walking 2 minutes for endurance Therapeutic activities: Functional strengthening activities: Education on how to sit on commode with knees higher than hips, diaphragmatic breathing to relax the pelvic floor, blowing out to relax the anus and generate force  to push stool out                                                                                                                          DATE: 06/23/23  EVAL Examination completed, findings reviewed, pt educated on POC, HEP, and female pelvic floor anatomy, reasoning with pelvic floor assessment internally with pt consent,. Pt motivated to participate in PT and agreeable to attempt recommendations.     PATIENT EDUCATION:  07/07/23 Education details: Access Code: [email or web address], education on abdominal massage and toileting Person educated: Patient Education method: Explanation, Demonstration, Tactile cues, Verbal cues, and Handouts Education comprehension: verbalized understanding, returned  demonstration, verbal cues required, tactile cues required, and needs further education  HOME EXERCISE PROGRAM: 07/07/23 Access Code: [email or web address] URL: [email or web address] Date: 07/07/2023 Prepared by: Marsha Skeen  Exercises - Seated Pelvic Floor Contraction  - 4 x daily - 7 x weekly - 1 sets - 5 reps - Supine Diaphragmatic Breathing  - 1 x daily - 7 x weekly - 3 sets - 10 reps - Seated Diaphragmatic Breathing  - 1 x daily - 7 x weekly - 3 sets - 10 reps  Patient Education - Abdominal Massage for Constipation - Abdominal Massage for Constipation  ASSESSMENT:  CLINICAL IMPRESSION: Patient is a 78 y.o. female who was seen today for physical therapy  treatment for therapy after vaginal prolaspe surgery on 04/15/23. She then was diagnosed with left fallopian tube cancer. She started chemotherapy on 06/04/23 and will have 6 treatments.  She was able to walk further with the 2 minute walk test by 40 feet. Her pelvic floor strength increase to 2/5 with a circular contraction. She had some tenderness located in the perineal body and left side of the pelvic floor. She is riding her exercis bike 25 minutes to build her endurance.   Patient would benefit from skilled therapy to improve pelvic floor strength and overall strength.   OBJECTIVE IMPAIRMENTS: Abnormal gait, decreased activity tolerance, decreased balance, decreased coordination, difficulty walking, decreased strength, increased fascial restrictions, and increased muscle spasms.   ACTIVITY LIMITATIONS: lifting, bending, bed mobility, continence, and locomotion level  PARTICIPATION LIMITATIONS: cleaning, laundry, shopping, and community activity  PERSONAL FACTORS: Age, Time since onset of injury/illness/exacerbation, and 3+ comorbidities: Surgery: s/p Robotic assisted total laparoscopic hysterectomy, with bilateral salpingo-oophorectomy, sacrocolpopexy Cleopatra Dada Lite Y), perineorrhaphy, cystoscopy on 04/15/23. Postoperative course  complicated by diagnosis of incidental fallopian tube cancer- referred to GYN Onc/ Heme- Onc and planning for chemotherapy.right breast cancer estrogen positive; Osteoporosis; IBS; Hypothyroidism; Scoliosis;   are also affecting patient's functional outcome.   REHAB POTENTIAL: Good  CLINICAL DECISION MAKING: Evolving/moderate complexity  EVALUATION COMPLEXITY: Moderate   GOALS: Goals reviewed with patient? Yes  SHORT TERM GOALS: Target date: 07/21/23  Patient independent with initial HEP for pelvic floor strength.  Baseline: Goal status:Met 07/14/23 2.  Patient able to perform diaphragmatic breathing to elongate the pelvic floor.  Baseline:  Goal status: Met 07/07/23  3.  Patient  educated on urgency to void behavioral technique.  Baseline:  Goal status: INITIAL   LONG TERM GOALS: Target date: 09/15/23  Patient independent with advanced HEP for core, pelvic floor and overall strength.  Baseline:  Goal status: INITIAL  2.  Patient reports she wears 1 or less pads per days due to reduction of urinary leakage with walking.  Baseline:  Goal status: INITIAL  3.  Patient is able to walk to the bathroom with no urinary leakage due to using the behavioral techniques and increased in pelvic floor strength.  Baseline:  Goal status: INITIAL  4.  Patient is able to walk >/= 462 feet in 2 minutes to reduce the risk of falling as she is walking to the bathroom.  Baseline:  Goal status: INITIAL  5.  Sit to stand 5 times </= 12.6 seconds to assist getting up from chair safely to walk to the bathroom when she has urgency.  Baseline:  Goal status: INITIAL   PLAN:  PT FREQUENCY: 1-2x/week  PT DURATION: 12 weeks  PLANNED INTERVENTIONS: 97110-Therapeutic exercises, 97530- Therapeutic activity, 97112- Neuromuscular re-education, 97535- Self Care, 16109- Manual therapy, Patient/Family education, Balance training, Dry Needling, and Biofeedback  PLAN FOR NEXT SESSION: 2 minutes walk test,   sit to stand, hip strength, urge to void   Marsha Skeen, PT 07/14/23 2:57 PM

## 2023-07-16 ENCOUNTER — Inpatient Hospital Stay: Attending: Adult Health

## 2023-07-16 ENCOUNTER — Inpatient Hospital Stay

## 2023-07-16 ENCOUNTER — Inpatient Hospital Stay: Admitting: Hematology and Oncology

## 2023-07-16 ENCOUNTER — Encounter: Payer: Self-pay | Admitting: Hematology and Oncology

## 2023-07-16 VITALS — HR 96

## 2023-07-16 VITALS — BP 133/89 | HR 106 | Temp 99.0°F | Resp 18 | Ht 63.0 in | Wt 123.6 lb

## 2023-07-16 DIAGNOSIS — C5702 Malignant neoplasm of left fallopian tube: Secondary | ICD-10-CM | POA: Insufficient documentation

## 2023-07-16 DIAGNOSIS — D696 Thrombocytopenia, unspecified: Secondary | ICD-10-CM | POA: Diagnosis not present

## 2023-07-16 DIAGNOSIS — Z5111 Encounter for antineoplastic chemotherapy: Secondary | ICD-10-CM | POA: Diagnosis present

## 2023-07-16 DIAGNOSIS — R739 Hyperglycemia, unspecified: Secondary | ICD-10-CM | POA: Diagnosis not present

## 2023-07-16 DIAGNOSIS — T50905A Adverse effect of unspecified drugs, medicaments and biological substances, initial encounter: Secondary | ICD-10-CM | POA: Insufficient documentation

## 2023-07-16 LAB — CMP (CANCER CENTER ONLY)
ALT: 17 U/L (ref 0–44)
AST: 19 U/L (ref 15–41)
Albumin: 4.1 g/dL (ref 3.5–5.0)
Alkaline Phosphatase: 94 U/L (ref 38–126)
Anion gap: 10 (ref 5–15)
BUN: 14 mg/dL (ref 8–23)
CO2: 25 mmol/L (ref 22–32)
Calcium: 9.4 mg/dL (ref 8.9–10.3)
Chloride: 104 mmol/L (ref 98–111)
Creatinine: 0.89 mg/dL (ref 0.44–1.00)
GFR, Estimated: >60 mL/min (ref >60)
Glucose, Bld: 228 mg/dL — ABNORMAL HIGH (ref 70–99)
Potassium: 3.8 mmol/L (ref 3.5–5.1)
Sodium: 139 mmol/L (ref 135–145)
Total Bilirubin: 0.4 mg/dL (ref 0.0–1.2)
Total Protein: 6.9 g/dL (ref 6.5–8.1)

## 2023-07-16 LAB — CBC WITH DIFFERENTIAL (CANCER CENTER ONLY)
Abs Immature Granulocytes: 0.03 10*3/uL (ref 0.00–0.07)
Basophils Absolute: 0 10*3/uL (ref 0.0–0.1)
Basophils Relative: 0 %
Eosinophils Absolute: 0 10*3/uL (ref 0.0–0.5)
Eosinophils Relative: 0 %
HCT: 37.5 % (ref 36.0–46.0)
Hemoglobin: 12.7 g/dL (ref 12.0–15.0)
Immature Granulocytes: 1 %
Lymphocytes Relative: 12 %
Lymphs Abs: 0.6 10*3/uL — ABNORMAL LOW (ref 0.7–4.0)
MCH: 30 pg (ref 26.0–34.0)
MCHC: 33.9 g/dL (ref 30.0–36.0)
MCV: 88.4 fL (ref 80.0–100.0)
Monocytes Absolute: 0.1 10*3/uL (ref 0.1–1.0)
Monocytes Relative: 2 %
Neutro Abs: 4 10*3/uL (ref 1.7–7.7)
Neutrophils Relative %: 85 %
Platelet Count: 119 10*3/uL — ABNORMAL LOW (ref 150–400)
RBC: 4.24 MIL/uL (ref 3.87–5.11)
RDW: 17.5 % — ABNORMAL HIGH (ref 11.5–15.5)
WBC Count: 4.8 10*3/uL (ref 4.0–10.5)
nRBC: 0 % (ref 0.0–0.2)

## 2023-07-16 MED ORDER — SODIUM CHLORIDE 0.9 % IV SOLN
150.0000 mg | Freq: Once | INTRAVENOUS | Status: AC
  Administered 2023-07-16: 150 mg via INTRAVENOUS
  Filled 2023-07-16: qty 150

## 2023-07-16 MED ORDER — DEXAMETHASONE SODIUM PHOSPHATE 10 MG/ML IJ SOLN
10.0000 mg | Freq: Once | INTRAMUSCULAR | Status: AC
  Administered 2023-07-16: 10 mg via INTRAVENOUS
  Filled 2023-07-16: qty 1

## 2023-07-16 MED ORDER — HEPARIN SOD (PORK) LOCK FLUSH 100 UNIT/ML IV SOLN
500.0000 [IU] | Freq: Once | INTRAVENOUS | Status: AC | PRN
  Administered 2023-07-16: 500 [IU]

## 2023-07-16 MED ORDER — CETIRIZINE HCL 10 MG/ML IV SOLN
10.0000 mg | Freq: Once | INTRAVENOUS | Status: AC
  Administered 2023-07-16: 10 mg via INTRAVENOUS
  Filled 2023-07-16: qty 1

## 2023-07-16 MED ORDER — SODIUM CHLORIDE 0.9 % IV SOLN
411.6000 mg | Freq: Once | INTRAVENOUS | Status: AC
  Administered 2023-07-16: 410 mg via INTRAVENOUS
  Filled 2023-07-16: qty 41

## 2023-07-16 MED ORDER — SODIUM CHLORIDE 0.9 % IV SOLN: INTRAVENOUS | Status: DC

## 2023-07-16 MED ORDER — SODIUM CHLORIDE 0.9 % IV SOLN
175.0000 mg/m2 | Freq: Once | INTRAVENOUS | Status: AC
  Administered 2023-07-16: 276 mg via INTRAVENOUS
  Filled 2023-07-16: qty 46

## 2023-07-16 MED ORDER — SODIUM CHLORIDE 0.9% FLUSH
10.0000 mL | INTRAVENOUS | Status: DC | PRN
  Administered 2023-07-16: 10 mL

## 2023-07-16 MED ORDER — SODIUM CHLORIDE 0.9% FLUSH
10.0000 mL | Freq: Once | INTRAVENOUS | Status: AC
  Administered 2023-07-16: 10 mL

## 2023-07-16 MED ORDER — PALONOSETRON HCL INJECTION 0.25 MG/5ML
0.2500 mg | Freq: Once | INTRAVENOUS | Status: AC
  Administered 2023-07-16: 0.25 mg via INTRAVENOUS
  Filled 2023-07-16: qty 5

## 2023-07-16 MED ORDER — FAMOTIDINE IN NACL 20-0.9 MG/50ML-% IV SOLN
20.0000 mg | Freq: Once | INTRAVENOUS | Status: AC
  Administered 2023-07-16: 20 mg via INTRAVENOUS
  Filled 2023-07-16: qty 50

## 2023-07-16 NOTE — Assessment & Plan Note (Addendum)
 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, stage II disease status post optimal debulking surgery  Final pathology high-grade serous, p53 mutated, Tumor marker was not helpful  She tolerated cycle 1 of chemotherapy well except for some excessive fatigue, bone aches and constipation which are self-limiting With recent cycles of treatment, she developed fatigue, loose stool, mild thrombocytopenia, steroid-induced hyperglycemia and mild weight loss We will proceed with cycle 3 of treatment without delay

## 2023-07-16 NOTE — Patient Instructions (Signed)

## 2023-07-16 NOTE — Progress Notes (Signed)
 [facility] OFFICE PROGRESS NOTE  Patient Care Team: Suan Elm, MD as PCP - General (Internal Medicine)  Assessment & Plan Cancer of left fallopian tube [facility]) 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, stage II disease status post optimal debulking surgery  Final pathology high-grade serous, p53 mutated, Tumor marker was not helpful  She tolerated cycle 1 of chemotherapy well except for some excessive fatigue, bone aches and constipation which are self-limiting With recent cycles of treatment, she developed fatigue, loose stool, mild thrombocytopenia, steroid-induced hyperglycemia and mild weight loss We will proceed with cycle 3 of treatment without delay  Thrombocytopenia (HCC) This is likely due to recent treatment. The patient denies recent history of bleeding such as epistaxis, hematuria or hematochezia. She is asymptomatic from the low platelet count. I will observe for now.  she does not require transfusion now. I will continue the chemotherapy at current dose without dosage adjustment.  If the thrombocytopenia gets progressive worse in the future, I might have to delay her treatment or adjust the chemotherapy dose.  Drug-induced hyperglycemia Due to steroids Observe closely  No orders of the defined types were placed in this encounter.    Jacqueline Jacobs, MD  INTERVAL HISTORY: she returns for treatment follow-up Complications related to previous cycle of chemotherapy included thrombocytopenia,, diarrhea,, weight loss,, fatigue,, and drug-induced hyperglycemia She denies peripheral neuropathy Her appetite is fair No recent bleeding  PHYSICAL EXAMINATION: ECOG PERFORMANCE STATUS: 1 - Symptomatic but completely ambulatory  Lab Results  Component Value Date   CAN125 17.3 05/11/2023      Latest Ref Rng & Units 07/16/2023    9:07 AM 06/25/2023    8:04 AM 06/02/2023    10:34 AM  CBC  WBC 4.0 - 10.5 K/uL 4.8  4.2  7.1   Hemoglobin 12.0 - 15.0 g/dL 04.5  40.9  81.1   Hematocrit 36.0 - 46.0 % 37.5  40.2  40.1   Platelets 150 - 400 K/uL 119  193  208       Chemistry      Component Value Date/Time   NA 139 07/16/2023 0907   NA 139 04/13/2023 1133   K 3.8 07/16/2023 0907   CL 104 07/16/2023 0907   CO2 25 07/16/2023 0907   BUN 14 07/16/2023 0907   BUN 11 04/13/2023 1133   CREATININE 0.89 07/16/2023 0907      Component Value Date/Time   CALCIUM  9.4 07/16/2023 0907   ALKPHOS 94 07/16/2023 0907   AST 19 07/16/2023 0907   ALT 17 07/16/2023 0907   BILITOT 0.4 07/16/2023 0907       Vitals:   07/16/23 0923  BP: 133/89  Pulse: (!) 106  Resp: 18  Temp: 99 F (37.2 C)  SpO2: 98%   Filed Weights   07/16/23 0923  Weight: 123 lb 9.6 oz (56.1 kg)   Other relevant data reviewed during this visit included CBC and CMP

## 2023-07-16 NOTE — Assessment & Plan Note (Addendum)
 Due to steroids Observe closely

## 2023-07-16 NOTE — Assessment & Plan Note (Addendum)
This is likely due to recent treatment. The patient denies recent history of bleeding such as epistaxis, hematuria or hematochezia. She is asymptomatic from the low platelet count. I will observe for now.  she does not require transfusion now. I will continue the chemotherapy at current dose without dosage adjustment.  If the thrombocytopenia gets progressive worse in the future, I might have to delay her treatment or adjust the chemotherapy dose.   

## 2023-07-21 ENCOUNTER — Encounter: Admitting: Physical Therapy

## 2023-07-28 ENCOUNTER — Encounter: Attending: Obstetrics and Gynecology | Admitting: Physical Therapy

## 2023-07-28 ENCOUNTER — Encounter: Payer: Self-pay | Admitting: Physical Therapy

## 2023-07-28 DIAGNOSIS — M6281 Muscle weakness (generalized): Secondary | ICD-10-CM | POA: Insufficient documentation

## 2023-07-28 DIAGNOSIS — R2681 Unsteadiness on feet: Secondary | ICD-10-CM | POA: Insufficient documentation

## 2023-07-28 DIAGNOSIS — Z483 Aftercare following surgery for neoplasm: Secondary | ICD-10-CM | POA: Diagnosis present

## 2023-07-28 NOTE — Patient Instructions (Signed)
 Urge Incontinence  Ideal urination frequency is every 2-4 wakeful hours, which equates to 5-8 times within a 24-hour period.   Urge incontinence is leakage that occurs when the bladder muscle contracts, creating a sudden need to go before getting to the bathroom.   Going too often when your bladder isn't actually full can disrupt the body's automatic signals to store and hold urine longer, which will increase urgency/frequency.  In this case, the bladder "is running the show" and strategies can be learned to retrain this pattern.   One should be able to control the first urge to urinate, at around .  The bladder can hold up to a "grande latte," or . To help you gain control, practice the Urge Drill below when urgency strikes.  This drill will help retrain your bladder signals and allow you to store and hold urine longer.  The overall goal is to stretch out your time between voids to reach a more manageable voiding schedule.    Practice your quick flicks often throughout the day (each waking hour) even when you don't need feel the urge to go.  This will help strengthen your pelvic floor muscles, making them more effective in controlling leakage.  Urge Drill  When you feel an urge to go, follow these steps to regain control: Stop what you are doing and be still Take one deep breath, directing your air into your abdomen Think an affirming thought, such as "I've got this." Do 5 quick flicks of your pelvic floor Raise your heels and let hit the floor hard Walk with control to the bathroom to void, or delay voiding If you get the urge again then stop and repeat as above.   Channing Pereyra, PT [facility] [address], [address] [place], [place] [address] W: [phone] [email or web address]

## 2023-07-28 NOTE — Therapy (Signed)
 OUTPATIENT PHYSICAL THERAPY FEMALE PELVIC TREATMENT   Patient Name: Jacqueline Ortiz MRN: [id number] DOB:02/24/1945, 78 y.o., female Today's Date: 07/28/2023  END OF SESSION:  PT End of Session - 07/28/23 1311     Visit Number 4    Date for PT Re-Evaluation 09/15/23    Authorization Type [employer] medicare    Authorization Time Period 06/23/2023 - 09/15/2023    Authorization - Number of Visits 4    Progress Note Due on Visit 10    PT Start Time 1310    PT Stop Time 1355    PT Time Calculation (min) 45 min    Activity Tolerance Patient tolerated treatment well    Behavior During Therapy [facility] for tasks assessed/performed          Past Medical History:  Diagnosis Date   Complete uterovaginal prolapse    Diverticulosis of colon    Dry eyes, bilateral 2000   Duodenal diverticulum    Dyslipidemia 2008   Eczema 1993   GAD (generalized anxiety disorder)    Hemorrhoids 2003   internal and external- remains- treats as needed with topical gels   History of adenomatous polyp of colon    followed by  Dr. Lupita Commander   History of hiatal hernia    large HH  by EGD s/p  [facility] repair and nissen fundoplication 02-06-2016 by dr rubin   History of kidney stones    1980s   Hyperlipidemia 2008   Hypertension 2021   Hypothyroidism    IBS (irritable bowel syndrome)    Malignant neoplasm of overlapping sites of right breast in female, estrogen receptor positive (HCC) 02/2021   oncologist--- dr catrina surgeon-- dr vanderbilt;   dx 02/ 2023;  Stage 1A3,  ER/PR+;  HG DCIS,  nodes negative;  04/ 05/ 2023  s/p  right lumpectomy w/ node dissecton's;  completed radiation 06-26-2021;  forego antiestrogen therapy   OA (osteoarthritis)    Osteoporosis 2003   Panic disorder    patient reports anxiety most of her life   Prolapse of vaginal walls    anterior and posterior   S/P dilatation of esophageal stricture 2008   s/p   2008/  2010   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 Lupita Commander   CYSTOSCOPY N/A 04/15/2023   Procedure: CYSTOSCOPY;  Surgeon: Marilynne Rosaline SAILOR, MD;  Location: [facility] OR;  Service: Gynecology;  Laterality: N/A;   DILATION AND CURETTAGE OF UTERUS     yrs ago   ESOPHAGOGASTRODUODENOSCOPY N/A 10/21/2015   Procedure: ESOPHAGOGASTRODUODENOSCOPY (EGD);  Surgeon: Toribio SHAUNNA Cedar, MD;  Location: [facility];  Service: Endoscopy;  Laterality: N/A;   IR IMAGING GUIDED PORT INSERTION  05/21/2023  LARYNX SURGERY  1993   pt stated due to left vocal cord paralized (unknown cause) told splints placed   PATELLA FRACTURE SURGERY Right 1996   has retained hardware   PERINEOPLASTY N/A 04/15/2023   Procedure: PERINEOPLASTY;  Surgeon: Marilynne Rosaline SAILOR, MD;  Location: [facility] OR;  Service: Gynecology;  Laterality: N/A;   ROBOTIC ASSISTED LAPAROSCOPIC NISSEN FUNDOPLICATION  02/06/2016   @[facility]  by dr rubin;  ROBOTIC ASSISTED HIATAL HERNIA REPAIR WITH MESH & NISSEN FUNDOPLICATION/  GASTROSTOMY TUBE   ROBOTIC ASSISTED LAPAROSCOPIC SACROCOLPOPEXY N/A 04/15/2023   Procedure: SACROCOLPOPEXY, ROBOT-ASSISTED, LAPAROSCOPIC;  Surgeon: Marilynne Rosaline SAILOR, MD;  Location: [facility] OR;  Service: Gynecology;  Laterality: N/A;   ROBOTIC ASSISTED TOTAL  HYSTERECTOMY WITH BILATERAL SALPINGO OOPHERECTOMY N/A 04/15/2023   Procedure: ROBOTIC ASSISTED TOTAL HYSTERECTOMY WITH BILATERAL SALPINGO OOPHORECTOMY;  Surgeon: Marilynne Rosaline SAILOR, MD;  Location: [facility] OR;  Service: Gynecology;  Laterality: N/A;  Total time requested is 3.5hrs   SENTINEL NODE BIOPSY Right 05/01/2021   Procedure: SENTINEL NODE BIOPSY;  Surgeon: Vanderbilt Ned, MD;  Location: [facility] OR;  Service: General;  Laterality: Right;   TONSILLECTOMY  1953   age 53   TUBAL LIGATION Bilateral 1977   Patient Active Problem List   Diagnosis Date Noted   Thrombocytopenia (HCC) 07/16/2023   Drug-induced hyperglycemia 07/16/2023   Bone aches 06/25/2023   Chronic constipation 05/26/2023   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   HTN (hypertension) 05/25/2022   HLD (hyperlipidemia) 05/25/2022   CAP (community acquired pneumonia) 05/25/2022   Genetic testing 04/12/2021   Family history of melanoma 03/28/2021   Family history of colon cancer 03/28/2021   Malignant neoplasm of overlapping sites of right breast in female, estrogen receptor positive (HCC) 03/05/2021   S/P Nissen fundoplication (with gastrostomy tube placement) (HCC) 02/06/2016   GERD with stricture 06/26/2011   History of colonic polyps 06/26/2011    PCP: Shepard Ade, MD  REFERRING PROVIDER: Zuleta, Kaitlin G, NP   REFERRING DIAG:  N81.3 (ICD-10-CM) - Uterovaginal prolapse, complete  K62.3 (ICD-10-CM) - Rectal prolapse  N81.10 (ICD-10-CM) - Prolapse of anterior vaginal wall  N81.6 (ICD-10-CM) - Prolapse of posterior vaginal wall    THERAPY DIAG:  Muscle weakness (generalized)  Gait instability  Aftercare following surgery for neoplasm  Rationale for Evaluation and Treatment: Rehabilitation  ONSET DATE: 04/15/23  SUBJECTIVE:                                                                                                                                                                                            SUBJECTIVE STATEMENT: Sometimes I have urgency and sometimes I do not.  I little bit of urine will come out.  .  Prolapse surgery and is 6 weeks out. They found the left fallopian tube cancer. Had her first chemotherapy treatment on 5/8. She will have 6 treatments.  Patient had a CAT scan no no cancer anywhere else.  Fluid intake:  When/What type of surgery did you have? 2. Did you have chemo and/or radiation? If so when started and ended?chemotherapy starts on 06/04/23 for 6 treatments.  3. Do you have neuropathy (if they had chemo?)none 4. Did they take lymph nodes out? If so, how many and what body part? None A.  If you had radiation and/or lymph nodes removed, they are at risk for lymphedema in that quadrant and that should be listed as a precaution B. No heat or ice should be applied to an area previously radiated because of the risk of radiation rebound C. If their arm is at risk for lymphedema, no very repetitive exercises (mainly UBE) because increased blood flow to that body part could bring on swelling (unlikely but you don't want to be the one to bring on lymphedema!) D. No blood pressure checks on the arm at risk for lymphedema E. No ultrasound to anyone with a hx of cancer Further Assess: A. Proximal swelling without distal swelling B. Shortness of breath C. Cellulitis D. Pain with weightbearing  PAIN:  Are you having pain? No  PRECAUTIONS: Other: breast and fallopian tube cancer  RED FLAGS: None   WEIGHT BEARING RESTRICTIONS: No  FALLS:  Has patient fallen in last 6 months? No  OCCUPATION: retired  ACTIVITY LEVEL : walking around the house in circles a few times per day.   PLOF: Independent  PATIENT GOALS: not have to go to the bathroom as much  PERTINENT HISTORY:  Surgery: s/p Robotic assisted total laparoscopic hysterectomy, with bilateral salpingo-oophorectomy, sacrocolpopexy Lucita Lite Y), perineorrhaphy, cystoscopy on 04/15/23. Postoperative  course complicated by diagnosis of incidental fallopian tube cancer- referred to GYN Onc/ Heme- Onc and planning for chemotherapy.right breast cancer estrogen positive; Osteoporosis; IBS; Hypothyroidism; Scoliosis;   Sexual abuse: No  BOWEL MOVEMENT: tendency to constipation Pain with bowel movement: No Type of bowel movement:Type (Bristol Stool Scale) Type 1,3, 4, Frequency if does not have a bowel movement in 2 days then takes Murelax, and Strain yes but not much Fully empty rectum: No only sometimes Leakage: No Fiber supplement/laxative Yes Murelax  URINATION: Pain with urination: No Fully empty bladder: Yes: has to sit a minute to make sure Stream: varies Urgency: Yes , when constipated has more urgency Frequency: 2-3 hours, gets up 1-2 times at night Leakage: Walking to the bathroom and Coughing, bending forward Pads: Yes: 4-5 pads, sometimes has to change pads due to the stool leakage when she takes stuff for her constipation 07/28/23: wears 4 pads per day and no stool leakage  INTERCOURSE: was active prior to surgery  Ability to have vaginal penetration Yes  Pain with intercourse: none  PREGNANCY: C-section deliveries 1   PROLAPSE: None   OBJECTIVE:  Note: Objective measures were completed at Evaluation unless otherwise noted.  DIAGNOSTIC FINDINGS:  none  PATIENT SURVEYS:  PFIQ-7: 22 UIQ-7: 29  COGNITION: Overall cognitive status: Within functional limits for tasks assessed     SENSATION: Light touch: Appears intact   FUNCTIONAL TESTS:  5 times sit to stand: 16 sec no hands 2 minute walk test: 222 feet Single leg stance: right 1 sec, left 4 sec 07/07/23: 2 minute walk test: 306 feet 07/14/23:  2 minute walk test: 340 feet 07/28/23; 2 minute walk test: 364 feet  GAIT: Assistive device utilized: None Comments: decreased heel strike on right  POSTURE: scoliosis   LUMBARAROM/PROM: She has scoliosis   LOWER EXTREMITY ROM:  Passive ROM Right eval  Left eval  Hip external rotation 40 40   (Blank rows = not tested)  LOWER EXTREMITY MMT:  MMT Right eval Left eval  Hip flexion 4/5 4/5  Hip extension 4/5 4/5  Hip abduction 3/5 3/5  Knee flexion 4/5 4/5  Knee extension 4/5 4/5  Ankle dorsiflexion 4/5 4/5  Ankle inversion 4/5 4/5  Ankle eversion 4/5 4/5   (Blank rows = not tested) PALPATION:                External Perineal Exam: intact                             Internal Pelvic Floor: tightness around the introitus  Patient confirms identification and approves PT to assess internal pelvic floor and treatment Yes  PELVIC MMT:   MMT eval 07/14/23  Vaginal 1/5 at first pushed therapist finger out but after therapist gave her verbal cues she was able to contract 2/5 with slight circular hug of therapist finger  (Blank rows = not tested)         TODAY'S TREATMENT:   07/28/23 Neuromuscular re-education: Down training: Educated patient on the urge to void behavioral technique to get to the bathroom in time Exercises: Strengthening: 2 minute walk test: 364 feet Bridge 10 x with verbal cues to lift her hips all the way.  Hip flexion isometric 10 x each leg with lower abdominals engaged Press ball between hands and bring over head with core engaged 10 x Standing hip flexion with pelvic floor contraction 10 x each side Standing hip abduction with pelvic floor contraction 10 x each side  07/14/23 Manual: Soft tissue mobilization: Manual mobilization of the perineal body going through the restrictions Manual mobilization along the ischiocavernosus to elongate Internal pelvic floor techniques: No emotional/communication barriers or cognitive limitation. Patient is motivated to learn. Patient understands and agrees with treatment goals and plan. PT explains patient will be examined in standing, sitting, and lying down to see how their muscles and joints work. When they are ready, they will be asked to remove their underwear so PT  can examine their perineum. The patient is also given the option of providing their own chaperone as one is not provided in our facility. The patient also has the right and is explained the right to defer or refuse any part of the evaluation or treatment including the internal exam. With the patient's consent, PT will use one gloved finger to gently assess the muscles of the pelvic floor, seeing how well it contracts and relaxes and if there is muscle symmetry. After, the patient will get dressed and PT and patient will discuss exam findings and plan of care. PT and patient discuss plan of care, schedule, attendance policy and HEP activities.  Therapist gloved finger in the vaginal canal working on the perineal body, sides of the introitus, levator ani and obturator internist to elongate for better quality contraction Neuromuscular re-education: Pelvic floor contraction training: Therapist gloved finger in the vaginal canal giving tactile cues to the pelvic floor muscles for contraction and breathing out with contraction Sitting pelvic floor contraction with breathing out Exercises: Strengthening: 2 minute walk test for 340 feet  07/07/23 Manual: Soft tissue mobilization: Circular massage to the abdomen to promote peristalic motion of the intestines Manual work to the diaphragm to elongate and prepare for diaphragmatic breathing Myofascial release: Fascial release around the lower abdomen to release indirectly by the colon and bladder Neuromuscular re-education: Down training: Diaphragmatic breathing with tactile cues to open up the lower rib cage in supine and sitting Supine opening up one side of the rib cage with tactile cues to push into therapist hands Exercises: Strengthening: Sit to stand 2 x 4 Walking 2 minutes for endurance Therapeutic activities: Functional strengthening activities: Education on how to sit on commode with knees higher than hips, diaphragmatic breathing to  relax the pelvic floor, blowing out to relax the anus and generate force to push stool out                                                                                                                          DATE: 06/23/23  EVAL Examination completed, findings reviewed, pt educated on POC, HEP, and female pelvic floor anatomy, reasoning with pelvic floor assessment internally with pt consent,. Pt motivated to participate in PT and agreeable to attempt recommendations.     PATIENT EDUCATION:  07/28/23 Education details: Access Code: [email or web address], education on abdominal massage and toileting Person educated: Patient Education method: Explanation, Demonstration, Tactile cues, Verbal cues, and Handouts Education comprehension: verbalized understanding, returned demonstration, verbal cues required, tactile cues required, and needs further education  HOME EXERCISE PROGRAM: 07/28/23 Access Code: [email or web address] URL: [email or web address] Date: 07/28/2023 Prepared by: Channing Pereyra  Exercises - Seated Pelvic Floor Contraction  - 4 x daily - 7 x weekly - 1 sets - 5 reps - Supine Diaphragmatic Breathing  - 1 x daily - 7 x weekly - 3 sets - 10 reps - Seated Diaphragmatic Breathing  - 1 x daily - 7 x weekly - 3 sets - 10 reps - Supine Bridge  - 1 x daily - 3 x weekly - 1 sets - 10 reps - Hooklying Isometric Hip Flexion  - 1 x daily - 3 x weekly - 1 sets - 10 reps - Supine Shoulder Overhead Flexion with Ball  - 1 x daily - 3 x weekly - 2 sets - 10 reps - Standing Marching  - 1 x daily - 3 x weekly - 1 sets - 10 reps - Standing Hip Abduction  - 1 x daily - 3 x weekly - 1 sets - 10 reps  Patient Education - Abdominal Massage for Constipation - Abdominal Massage for Constipation  ASSESSMENT:  CLINICAL IMPRESSION: Patient is a 78 y.o. female who was seen today for physical therapy  treatment for therapy after vaginal prolaspe surgery on 04/15/23. She then was diagnosed with left fallopian tube  cancer. She started chemotherapy on 06/04/23 and will have 6 treatments.  She was able to walk further with the 2 minute walk test by 24 feet.Patient wears 4 pads per day due to the urinary leakage.  Patient does not have to sit awhile for urine to come out anymore.  Patient will leak urine during the night when asleep. Patient will leak a little urine when she sits awhile then gets up.  Patient is not having issues with constipation at this time. No stool leakage. Patient would benefit from skilled therapy to improve pelvic floor strength and overall strength.   OBJECTIVE IMPAIRMENTS: Abnormal gait, decreased activity tolerance, decreased balance, decreased coordination, difficulty walking, decreased strength, increased fascial restrictions, and increased muscle spasms.   ACTIVITY LIMITATIONS: lifting, bending, bed mobility, continence, and locomotion level  PARTICIPATION LIMITATIONS: cleaning, laundry, shopping, and community activity  PERSONAL FACTORS: Age, Time since onset of injury/illness/exacerbation, and 3+ comorbidities: Surgery: s/p Robotic assisted total laparoscopic hysterectomy, with bilateral salpingo-oophorectomy, sacrocolpopexy Lucita Lite Y), perineorrhaphy, cystoscopy on 04/15/23. Postoperative course complicated by diagnosis of incidental fallopian tube cancer- referred to GYN Onc/ Heme- Onc and planning for chemotherapy.right breast cancer estrogen positive; Osteoporosis; IBS; Hypothyroidism; Scoliosis;   are also affecting patient's functional outcome.   REHAB POTENTIAL: Good  CLINICAL DECISION MAKING: Evolving/moderate complexity  EVALUATION COMPLEXITY: Moderate   GOALS: Goals reviewed with patient? Yes  SHORT TERM GOALS: Target date: 07/21/23  Patient independent with initial HEP for pelvic floor strength.  Baseline: Goal status:Met 07/14/23 2.  Patient able to perform diaphragmatic breathing to elongate the pelvic floor.  Baseline:  Goal status: Met 07/07/23  3.   Patient educated on urgency to void behavioral technique.  Baseline:  Goal status: Met 07/28/23   LONG TERM GOALS: Target date: 09/15/23  Patient independent with advanced HEP for core, pelvic floor and overall strength.  Baseline:  Goal status: INITIAL  2.  Patient reports she wears 1 or less pads per days due to reduction of urinary leakage with walking.  Baseline:  Goal status: INITIAL  3.  Patient is able to walk to the bathroom with no urinary leakage due to using the behavioral techniques and increased in pelvic floor strength.  Baseline:  Goal status: INITIAL  4.  Patient is able to walk >/= 462 feet in 2 minutes to reduce the risk of falling as she is walking to the bathroom.  Baseline:  Goal status: INITIAL  5.  Sit to stand 5 times </= 12.6 seconds to assist getting up from chair safely to walk to the bathroom when she has urgency.  Baseline:  Goal status: INITIAL   PLAN:  PT FREQUENCY: 1-2x/week  PT DURATION: 12 weeks  PLANNED INTERVENTIONS: 97110-Therapeutic exercises, 97530- Therapeutic activity, 97112- Neuromuscular re-education, 97535- Self Care, [id number]- Manual therapy, Patient/Family education, Balance training, Dry Needling, and Biofeedback  PLAN FOR NEXT SESSION: 2 minutes walk test,  sit to stand, hip strength; see how the urge to void is going  Channing Pereyra, PT 07/28/23 2:00 PM

## 2023-07-29 ENCOUNTER — Ambulatory Visit: Admitting: Obstetrics and Gynecology

## 2023-07-29 ENCOUNTER — Encounter: Payer: Self-pay | Admitting: Obstetrics and Gynecology

## 2023-07-29 VITALS — BP 121/81 | HR 94

## 2023-07-29 DIAGNOSIS — N3281 Overactive bladder: Secondary | ICD-10-CM

## 2023-07-29 NOTE — Patient Instructions (Addendum)
 You can consider adding in pumpkin seed extract to your regimen. Up to 5gm daily has been shown to support bladder health. I will send you some links on MyChart.   I hope your next few months of chemo go well and then things improve!  Please call if you have any concerns with vaginal issues, bladder issues, UTI, or other concerning symptoms.

## 2023-07-29 NOTE — Progress Notes (Signed)
 [facility] Return Visit  SUBJECTIVE  History of Present Illness: Jacqueline Ortiz is a 78 y.o. female seen in follow-up for OAB symptoms. Plan at last visit was start Tropicum 20mg  twice daily. She reports it seemed to help but that she had significant dry mouth that was not feasible. She reports she has been doing PT with Jacqueline Ortiz PT and is doing well overall. She reports she does not think she would like to do another medication at this time.   Denies vaginal or rectal concerns.   Past Medical History: Patient  has a past medical history of Complete uterovaginal prolapse, Diverticulosis of colon, Dry eyes, bilateral (2000), Duodenal diverticulum, Dyslipidemia (2008), Eczema (1993), GAD (generalized anxiety disorder), Hemorrhoids (2003), History of adenomatous polyp of colon, History of hiatal hernia, History of kidney stones, Hyperlipidemia (2008), Hypertension (2021), Hypothyroidism, IBS (irritable bowel syndrome), Malignant neoplasm of overlapping sites of right breast in female, estrogen receptor positive (HCC) (02/2021), OA (osteoarthritis), Osteoporosis (2003), Panic disorder, Prolapse of vaginal walls, S/P dilatation of esophageal stricture (2008), 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   Past Surgical History: She  has a past surgical history that includes Tubal ligation (Bilateral, 1977); Colonoscopy with propofol  (09/04/2020); Cesarean section (1976); Dilation and curettage of uterus; Patella fracture surgery (Right, 1996); Tonsillectomy (1953); Esophagogastroduodenoscopy (N/A, 10/21/2015); Robotic assisted laparoscopic nissen fundoplication (02/06/2016); Breast lumpectomy with radioactive seed localization (Right, 05/01/2021); Sentinel node biopsy (Right, 05/01/2021); Larynx surgery (1993); Robotic assisted total hysterectomy with bilateral  salpingo oophorectomy (N/A, 04/15/2023); Robotic assisted laparoscopic sacrocolpopexy (N/A, 04/15/2023); Perineoplasty (N/A, 04/15/2023); Cystoscopy (N/A, 04/15/2023); and IR IMAGING GUIDED PORT INSERTION (05/21/2023).   Medications: She has a current medication list which includes the following prescription(s): atorvastatin , calcium -magnesium -vitamin d, dexamethasone , robitussin nighttime cough dm, escitalopram, estradiol , levothyroxine, lidocaine -prilocaine , loratadine, ondansetron , polyethylene glycol, prochlorperazine , sodium chloride , triamterene -hydrochlorothiazide , and trospium .   Allergies: Patient has no known allergies.   Social History: Patient  reports that she has never smoked. She has never used smokeless tobacco. She reports that she does not drink alcohol and does not use drugs.     OBJECTIVE     Physical Exam: Vitals:   07/29/23 1256  BP: 121/81  Pulse: 94   Gen: No apparent distress, A&O x 3.  Detailed Urogynecologic Evaluation:  Deferred.    ASSESSMENT AND PLAN    Jacqueline Ortiz is a 78 y.o. with:  1. Overactive bladder    Patient does not wish to try another medication at this time. She has about 3 more chemo sessions and a few more sessions of PT to do.  We discussed that if she would like some more support for her bladder that is not a formal medication she can consider pumpkin seed extract up to 5gm daily for bladder support.   Patient to follow up in 4 months or sooner if needed.   Jeff Mccallum G Alf Doyle, NP

## 2023-08-05 MED FILL — Fosaprepitant Dimeglumine For IV Infusion 150 MG (Base Eq): INTRAVENOUS | Qty: 5 | Status: AC

## 2023-08-06 ENCOUNTER — Inpatient Hospital Stay

## 2023-08-06 ENCOUNTER — Inpatient Hospital Stay: Attending: Adult Health

## 2023-08-06 ENCOUNTER — Encounter: Payer: Self-pay | Admitting: Hematology and Oncology

## 2023-08-06 ENCOUNTER — Inpatient Hospital Stay: Admitting: Hematology and Oncology

## 2023-08-06 VITALS — BP 142/82 | HR 102 | Temp 98.0°F | Resp 18 | Ht 63.0 in | Wt 125.3 lb

## 2023-08-06 DIAGNOSIS — C5702 Malignant neoplasm of left fallopian tube: Secondary | ICD-10-CM | POA: Insufficient documentation

## 2023-08-06 DIAGNOSIS — K5900 Constipation, unspecified: Secondary | ICD-10-CM | POA: Insufficient documentation

## 2023-08-06 DIAGNOSIS — R739 Hyperglycemia, unspecified: Secondary | ICD-10-CM | POA: Insufficient documentation

## 2023-08-06 DIAGNOSIS — Z5111 Encounter for antineoplastic chemotherapy: Secondary | ICD-10-CM | POA: Insufficient documentation

## 2023-08-06 DIAGNOSIS — D696 Thrombocytopenia, unspecified: Secondary | ICD-10-CM | POA: Diagnosis not present

## 2023-08-06 DIAGNOSIS — D6959 Other secondary thrombocytopenia: Secondary | ICD-10-CM | POA: Insufficient documentation

## 2023-08-06 DIAGNOSIS — Z452 Encounter for adjustment and management of vascular access device: Secondary | ICD-10-CM | POA: Diagnosis not present

## 2023-08-06 LAB — CBC WITH DIFFERENTIAL (CANCER CENTER ONLY)
Abs Immature Granulocytes: 0.03 K/uL (ref 0.00–0.07)
Basophils Absolute: 0 K/uL (ref 0.0–0.1)
Basophils Relative: 0 %
Eosinophils Absolute: 0 K/uL (ref 0.0–0.5)
Eosinophils Relative: 0 %
HCT: 34.7 % — ABNORMAL LOW (ref 36.0–46.0)
Hemoglobin: 11.7 g/dL — ABNORMAL LOW (ref 12.0–15.0)
Immature Granulocytes: 1 %
Lymphocytes Relative: 13 %
Lymphs Abs: 0.6 K/uL — ABNORMAL LOW (ref 0.7–4.0)
MCH: 30.6 pg (ref 26.0–34.0)
MCHC: 33.7 g/dL (ref 30.0–36.0)
MCV: 90.8 fL (ref 80.0–100.0)
Monocytes Absolute: 0.1 K/uL (ref 0.1–1.0)
Monocytes Relative: 2 %
Neutro Abs: 3.6 K/uL (ref 1.7–7.7)
Neutrophils Relative %: 84 %
Platelet Count: 56 K/uL — ABNORMAL LOW (ref 150–400)
RBC: 3.82 MIL/uL — ABNORMAL LOW (ref 3.87–5.11)
RDW: 19.3 % — ABNORMAL HIGH (ref 11.5–15.5)
WBC Count: 4.2 K/uL (ref 4.0–10.5)
nRBC: 0 % (ref 0.0–0.2)

## 2023-08-06 LAB — CMP (CANCER CENTER ONLY)
ALT: 16 U/L (ref 0–44)
AST: 18 U/L (ref 15–41)
Albumin: 3.8 g/dL (ref 3.5–5.0)
Alkaline Phosphatase: 101 U/L (ref 38–126)
Anion gap: 10 (ref 5–15)
BUN: 12 mg/dL (ref 8–23)
CO2: 25 mmol/L (ref 22–32)
Calcium: 9.3 mg/dL (ref 8.9–10.3)
Chloride: 105 mmol/L (ref 98–111)
Creatinine: 0.8 mg/dL (ref 0.44–1.00)
GFR, Estimated: >60 mL/min (ref >60)
Glucose, Bld: 212 mg/dL — ABNORMAL HIGH (ref 70–99)
Potassium: 3.8 mmol/L (ref 3.5–5.1)
Sodium: 140 mmol/L (ref 135–145)
Total Bilirubin: 0.4 mg/dL (ref 0.0–1.2)
Total Protein: 6.6 g/dL (ref 6.5–8.1)

## 2023-08-06 MED ORDER — SODIUM CHLORIDE 0.9% FLUSH
10.0000 mL | Freq: Once | INTRAVENOUS | Status: AC
  Administered 2023-08-06: 10 mL

## 2023-08-06 NOTE — Progress Notes (Signed)
 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  Final pathology high-grade serous, p53 mutated, Tumor marker was not helpful  She tolerated cycle 1 of chemotherapy well except for some excessive fatigue, bone aches and constipation which are self-limiting With recent cycles of treatment, she developed fatigue, loose stool, mild thrombocytopenia, steroid-induced hyperglycemia and mild weight loss With cycle 3 of chemotherapy, it caused significant thrombocytopenia Will put her treatment on hold and defer for 10 days I plan dose reduction for future chemotherapy cycle Thrombocytopenia (HCC) She has worsening thrombocytopenia from chemotherapy We would defer her treatment She does not need transfusion support I plan dose reduction for future cycles  Orders Placed This Encounter  Procedures   CBC with Differential (Cancer Center Only)    Standing Status:   Future    Expected Date:   08/17/2023    Expiration Date:   08/16/2024   CMP (Cancer Center only)    Standing Status:   Future    Expected Date:   08/17/2023    Expiration Date:   08/16/2024     Almarie Bedford, MD  INTERVAL HISTORY: she returns for treatment follow-up for cycle 4 Complications related to previous cycle of chemotherapy included anemia,, thrombocytopenia,, and fatigue, She denies nausea No peripheral neuropathy  PHYSICAL EXAMINATION: ECOG PERFORMANCE STATUS: 1 - Symptomatic but completely ambulatory  Lab Results  Component Value Date   CAN125 17.3 05/11/2023      Latest Ref Rng & Units 08/06/2023    7:56 AM 07/16/2023    9:07 AM 06/25/2023    8:04 AM  CBC  WBC 4.0 - 10.5 K/uL 4.2  4.8  4.2    Hemoglobin 12.0 - 15.0 g/dL 88.2  87.2  86.7   Hematocrit 36.0 - 46.0 % 34.7  37.5  40.2   Platelets 150 - 400 K/uL 56  119  193       Chemistry      Component Value Date/Time   NA 140 08/06/2023 0756   NA 139 04/13/2023 1133   K 3.8 08/06/2023 0756   CL 105 08/06/2023 0756   CO2 25 08/06/2023 0756   BUN 12 08/06/2023 0756   BUN 11 04/13/2023 1133   CREATININE 0.80 08/06/2023 0756      Component Value Date/Time   CALCIUM  9.3 08/06/2023 0756   ALKPHOS 101 08/06/2023 0756   AST 18 08/06/2023 0756   ALT 16 08/06/2023 0756   BILITOT 0.4 08/06/2023 0756       Vitals:   08/06/23 0850  BP: (!) 142/82  Pulse: (!) 102  Resp: 18  Temp: 98 F (36.7 C)  SpO2: 97%   Filed Weights   08/06/23 0850  Weight: 125 lb 4.8 oz (56.8 kg)   Other relevant data reviewed during this visit included CBC and CMP

## 2023-08-06 NOTE — Assessment & Plan Note (Addendum)
 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

## 2023-08-06 NOTE — Assessment & Plan Note (Addendum)
 She has worsening thrombocytopenia from chemotherapy We would defer her treatment She does not need transfusion support I plan dose reduction for future cycles

## 2023-08-11 ENCOUNTER — Encounter: Admitting: Physical Therapy

## 2023-08-14 MED FILL — Fosaprepitant Dimeglumine For IV Infusion 150 MG (Base Eq): INTRAVENOUS | Qty: 5 | Status: AC

## 2023-08-17 ENCOUNTER — Encounter: Payer: Self-pay | Admitting: Hematology and Oncology

## 2023-08-17 ENCOUNTER — Inpatient Hospital Stay

## 2023-08-17 ENCOUNTER — Inpatient Hospital Stay: Admitting: Hematology and Oncology

## 2023-08-17 VITALS — BP 136/86 | HR 106 | Temp 98.7°F | Resp 18 | Ht 63.0 in | Wt 124.4 lb

## 2023-08-17 VITALS — HR 88

## 2023-08-17 DIAGNOSIS — C5702 Malignant neoplasm of left fallopian tube: Secondary | ICD-10-CM | POA: Diagnosis not present

## 2023-08-17 DIAGNOSIS — R739 Hyperglycemia, unspecified: Secondary | ICD-10-CM | POA: Diagnosis not present

## 2023-08-17 DIAGNOSIS — D696 Thrombocytopenia, unspecified: Secondary | ICD-10-CM

## 2023-08-17 DIAGNOSIS — Z5111 Encounter for antineoplastic chemotherapy: Secondary | ICD-10-CM | POA: Diagnosis not present

## 2023-08-17 DIAGNOSIS — T50905A Adverse effect of unspecified drugs, medicaments and biological substances, initial encounter: Secondary | ICD-10-CM

## 2023-08-17 LAB — CBC WITH DIFFERENTIAL (CANCER CENTER ONLY)
Abs Immature Granulocytes: 0.02 K/uL (ref 0.00–0.07)
Basophils Absolute: 0 K/uL (ref 0.0–0.1)
Basophils Relative: 0 %
Eosinophils Absolute: 0 K/uL (ref 0.0–0.5)
Eosinophils Relative: 0 %
HCT: 37.5 % (ref 36.0–46.0)
Hemoglobin: 12.5 g/dL (ref 12.0–15.0)
Immature Granulocytes: 1 %
Lymphocytes Relative: 15 %
Lymphs Abs: 0.6 K/uL — ABNORMAL LOW (ref 0.7–4.0)
MCH: 31.4 pg (ref 26.0–34.0)
MCHC: 33.3 g/dL (ref 30.0–36.0)
MCV: 94.2 fL (ref 80.0–100.0)
Monocytes Absolute: 0.1 K/uL (ref 0.1–1.0)
Monocytes Relative: 2 %
Neutro Abs: 3.4 K/uL (ref 1.7–7.7)
Neutrophils Relative %: 82 %
Platelet Count: 316 K/uL (ref 150–400)
RBC: 3.98 MIL/uL (ref 3.87–5.11)
RDW: 21.8 % — ABNORMAL HIGH (ref 11.5–15.5)
WBC Count: 4.2 K/uL (ref 4.0–10.5)
nRBC: 0 % (ref 0.0–0.2)

## 2023-08-17 LAB — CMP (CANCER CENTER ONLY)
ALT: 14 U/L (ref 0–44)
AST: 17 U/L (ref 15–41)
Albumin: 3.9 g/dL (ref 3.5–5.0)
Alkaline Phosphatase: 113 U/L (ref 38–126)
Anion gap: 10 (ref 5–15)
BUN: 12 mg/dL (ref 8–23)
CO2: 24 mmol/L (ref 22–32)
Calcium: 9.3 mg/dL (ref 8.9–10.3)
Chloride: 105 mmol/L (ref 98–111)
Creatinine: 0.89 mg/dL (ref 0.44–1.00)
GFR, Estimated: >60 mL/min (ref >60)
Glucose, Bld: 238 mg/dL — ABNORMAL HIGH (ref 70–99)
Potassium: 3.6 mmol/L (ref 3.5–5.1)
Sodium: 139 mmol/L (ref 135–145)
Total Bilirubin: 0.4 mg/dL (ref 0.0–1.2)
Total Protein: 6.8 g/dL (ref 6.5–8.1)

## 2023-08-17 MED ORDER — PALONOSETRON HCL INJECTION 0.25 MG/5ML
0.2500 mg | Freq: Once | INTRAVENOUS | Status: AC
  Administered 2023-08-17: 0.25 mg via INTRAVENOUS
  Filled 2023-08-17: qty 5

## 2023-08-17 MED ORDER — SODIUM CHLORIDE 0.9 % IV SOLN: INTRAVENOUS | Status: DC

## 2023-08-17 MED ORDER — FOSAPREPITANT DIMEGLUMINE INJECTION 150 MG
150.0000 mg | Freq: Once | INTRAVENOUS | Status: AC
  Administered 2023-08-17: 150 mg via INTRAVENOUS
  Filled 2023-08-17: qty 5
  Filled 2023-08-17: qty 150

## 2023-08-17 MED ORDER — SODIUM CHLORIDE 0.9% FLUSH
10.0000 mL | Freq: Once | INTRAVENOUS | Status: AC
Start: 2023-08-17 — End: 2023-08-17
  Administered 2023-08-17: 10 mL

## 2023-08-17 MED ORDER — FAMOTIDINE IN NACL 20-0.9 MG/50ML-% IV SOLN
20.0000 mg | Freq: Once | INTRAVENOUS | Status: AC
  Administered 2023-08-17: 20 mg via INTRAVENOUS
  Filled 2023-08-17: qty 50

## 2023-08-17 MED ORDER — SODIUM CHLORIDE 0.9 % IV SOLN
175.0000 mg/m2 | Freq: Once | INTRAVENOUS | Status: AC
  Administered 2023-08-17: 276 mg via INTRAVENOUS
  Filled 2023-08-17: qty 46

## 2023-08-17 MED ORDER — DEXAMETHASONE SODIUM PHOSPHATE 10 MG/ML IJ SOLN
10.0000 mg | Freq: Once | INTRAMUSCULAR | Status: AC
  Administered 2023-08-17: 10 mg via INTRAVENOUS
  Filled 2023-08-17: qty 1

## 2023-08-17 MED ORDER — SODIUM CHLORIDE 0.9% FLUSH
10.0000 mL | INTRAVENOUS | Status: DC | PRN
  Administered 2023-08-17: 10 mL

## 2023-08-17 MED ORDER — CETIRIZINE HCL 10 MG/ML IV SOLN
10.0000 mg | Freq: Once | INTRAVENOUS | Status: AC
  Administered 2023-08-17: 10 mg via INTRAVENOUS
  Filled 2023-08-17: qty 1

## 2023-08-17 MED ORDER — SODIUM CHLORIDE 0.9 % IV SOLN
343.0000 mg | Freq: Once | INTRAVENOUS | Status: AC
  Administered 2023-08-17: 340 mg via INTRAVENOUS
  Filled 2023-08-17: qty 34

## 2023-08-17 MED ORDER — HEPARIN SOD (PORK) LOCK FLUSH 100 UNIT/ML IV SOLN
500.0000 [IU] | Freq: Once | INTRAVENOUS | Status: AC | PRN
  Administered 2023-08-17: 500 [IU]

## 2023-08-17 NOTE — Assessment & Plan Note (Addendum)
 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, stage II disease status post optimal debulking surgery  Final pathology high-grade serous, p53 mutated, Tumor marker was not helpful  She tolerated cycle 1 of chemotherapy well except for some excessive fatigue, bone aches and constipation which are self-limiting With recent cycles of treatment, she developed fatigue, loose stool, mild thrombocytopenia, steroid-induced hyperglycemia and mild weight loss With cycle 3 of chemotherapy, it caused significant thrombocytopenia After we deferred treatment for 10 days, repeat CBC today came back normal Moving forward, we will proceed with treatment with slight dose reduction of carboplatin 

## 2023-08-17 NOTE — Assessment & Plan Note (Addendum)
 Discussed resolved, likely due to side effects of chemotherapy As above, I plan to reduce the dose of carboplatin 

## 2023-08-17 NOTE — Assessment & Plan Note (Addendum)
 Due to steroids Observe closely

## 2023-08-17 NOTE — Progress Notes (Signed)
 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  Final pathology high-grade serous, p53 mutated, Tumor marker was not helpful  She tolerated cycle 1 of chemotherapy well except for some excessive fatigue, bone aches and constipation which are self-limiting With recent cycles of treatment, she developed fatigue, loose stool, mild thrombocytopenia, steroid-induced hyperglycemia and mild weight loss With cycle 3 of chemotherapy, it caused significant thrombocytopenia After we deferred treatment for 10 days, repeat CBC today came back normal Moving forward, we will proceed with treatment with slight dose reduction of carboplatin  Thrombocytopenia (HCC) Discussed resolved, likely due to side effects of chemotherapy As above, I plan to reduce the dose of carboplatin  Drug-induced hyperglycemia Due to steroids Observe closely  No orders of the defined types were placed in this encounter.    Almarie Bedford, MD  INTERVAL HISTORY: she returns for treatment follow-up Complications related to previous cycle of chemotherapy included elevated blood sugar due to steroids Her recent treatment was delayed due to thrombocytopenia Since last time I saw her, she denies recent bleeding We discussed dose reduction due to recent thrombocytopenia  PHYSICAL EXAMINATION: ECOG PERFORMANCE STATUS: 1 - Symptomatic but completely ambulatory  Lab Results  Component Value Date   CAN125 17.3 05/11/2023      Latest Ref Rng & Units 08/17/2023    9:59 AM 08/06/2023    7:56 AM 07/16/2023    9:07 AM  CBC  WBC 4.0 - 10.5 K/uL 4.2  4.2  4.8   Hemoglobin 12.0 - 15.0 g/dL 87.4  88.2  87.2   Hematocrit  36.0 - 46.0 % 37.5  34.7  37.5   Platelets 150 - 400 K/uL 316  56  119       Chemistry      Component Value Date/Time   NA 140 08/06/2023 0756   NA 139 04/13/2023 1133   K 3.8 08/06/2023 0756   CL 105 08/06/2023 0756   CO2 25 08/06/2023 0756   BUN 12 08/06/2023 0756   BUN 11 04/13/2023 1133   CREATININE 0.80 08/06/2023 0756      Component Value Date/Time   CALCIUM  9.3 08/06/2023 0756   ALKPHOS 101 08/06/2023 0756   AST 18 08/06/2023 0756   ALT 16 08/06/2023 0756   BILITOT 0.4 08/06/2023 0756       Vitals:   08/17/23 1027  BP: 136/86  Pulse: (!) 106  Resp: 18  Temp: 98.7 F (37.1 C)  SpO2: 95%   Filed Weights   08/17/23 1027  Weight: 124 lb 6.4 oz (56.4 kg)   Other relevant data reviewed during this visit included CBC and CMP

## 2023-08-17 NOTE — Patient Instructions (Signed)

## 2023-08-25 ENCOUNTER — Encounter: Payer: Self-pay | Admitting: Physical Therapy

## 2023-08-25 DIAGNOSIS — R2681 Unsteadiness on feet: Secondary | ICD-10-CM

## 2023-08-25 DIAGNOSIS — M6281 Muscle weakness (generalized): Secondary | ICD-10-CM | POA: Diagnosis not present

## 2023-08-25 DIAGNOSIS — Z483 Aftercare following surgery for neoplasm: Secondary | ICD-10-CM

## 2023-08-25 NOTE — Therapy (Signed)
 OUTPATIENT PHYSICAL THERAPY FEMALE PELVIC TREATMENT   Patient Name: Jacqueline Ortiz MRN: [id number] DOB:05/26/1945, 78 y.o., female Today's Date: 08/25/2023  END OF SESSION:  PT End of Session - 08/25/23 1506     Visit Number 5    Date for PT Re-Evaluation 12/08/23    Authorization Type [employer] medicare    Authorization Time Period 06/23/2023 - 09/15/2023    Authorization - Number of Visits 5    Progress Note Due on Visit 10    PT Start Time 1500    PT Stop Time 1545    PT Time Calculation (min) 45 min    Activity Tolerance Patient tolerated treatment well    Behavior During Therapy [facility] for tasks assessed/performed          Past Medical History:  Diagnosis Date   Complete uterovaginal prolapse    Diverticulosis of colon    Dry eyes, bilateral 2000   Duodenal diverticulum    Dyslipidemia 2008   Eczema 1993   GAD (generalized anxiety disorder)    Hemorrhoids 2003   internal and external- remains- treats as needed with topical gels   History of adenomatous polyp of colon    followed by  Dr. Lupita Commander   History of hiatal hernia    large HH  by EGD s/p  [facility] repair and nissen fundoplication 02-06-2016 by dr rubin   History of kidney stones    1980s   Hyperlipidemia 2008   Hypertension 2021   Hypothyroidism    IBS (irritable bowel syndrome)    Malignant neoplasm of overlapping sites of right breast in female, estrogen receptor positive (HCC) 02/2021   oncologist--- dr catrina surgeon-- dr vanderbilt;   dx 02/ 2023;  Stage 1A3,  ER/PR+;  HG DCIS,  nodes negative;  04/ 05/ 2023  s/p  right lumpectomy w/ node dissecton's;  completed radiation 06-26-2021;  forego antiestrogen therapy   OA (osteoarthritis)    Osteoporosis 2003   Panic disorder    patient reports anxiety most of her life   Prolapse of vaginal walls    anterior and posterior   S/P dilatation of esophageal stricture 2008   s/p   2008/  2010   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 Lupita Commander   CYSTOSCOPY N/A 04/15/2023   Procedure: CYSTOSCOPY;  Surgeon: Marilynne Rosaline SAILOR, MD;  Location: [facility] OR;  Service: Gynecology;  Laterality: N/A;   DILATION AND CURETTAGE OF UTERUS     yrs ago   ESOPHAGOGASTRODUODENOSCOPY N/A 10/21/2015   Procedure: ESOPHAGOGASTRODUODENOSCOPY (EGD);  Surgeon: Toribio SHAUNNA Cedar, MD;  Location: [facility];  Service: Endoscopy;  Laterality: N/A;   IR IMAGING GUIDED PORT INSERTION  05/21/2023  LARYNX SURGERY  1993   pt stated due to left vocal cord paralized (unknown cause) told splints placed   PATELLA FRACTURE SURGERY Right 1996   has retained hardware   PERINEOPLASTY N/A 04/15/2023   Procedure: PERINEOPLASTY;  Surgeon: Marilynne Rosaline SAILOR, MD;  Location[facility] OR;  Service: Gynecology;  Laterality: N/A;   ROBOTIC ASSISTED LAPAROSCOPIC NISSEN FUNDOPLICATION  02/06/2016   @[facility]  by dr rubin;  ROBOTIC ASSISTED HIATAL HERNIA REPAIR WITH MESH & NISSEN FUNDOPLICATION/  GASTROSTOMY TUBE   ROBOTIC ASSISTED LAPAROSCOPIC SACROCOLPOPEXY N/A 04/15/2023   Procedure: SACROCOLPOPEXY, ROBOT-ASSISTED, LAPAROSCOPIC;  Surgeon: Marilynne Rosaline SAILOR, MD;  Location: [facility] OR;  Service: Gynecology;  Laterality: N/A;   ROBOTIC ASSISTED TOTAL  HYSTERECTOMY WITH BILATERAL SALPINGO OOPHERECTOMY N/A 04/15/2023   Procedure: ROBOTIC ASSISTED TOTAL HYSTERECTOMY WITH BILATERAL SALPINGO OOPHORECTOMY;  Surgeon: Marilynne Rosaline SAILOR, MD;  Location: [facility] OR;  Service: Gynecology;  Laterality: N/A;  Total time requested is 3.5hrs   SENTINEL NODE BIOPSY Right 05/01/2021   Procedure: SENTINEL NODE BIOPSY;  Surgeon: Vanderbilt Ned, MD;  Location: [facility] OR;  Service: General;  Laterality: Right;   TONSILLECTOMY  1953   age 36   TUBAL LIGATION Bilateral 1977   Patient Active Problem List   Diagnosis Date Noted   Thrombocytopenia (HCC) 07/16/2023   Drug-induced hyperglycemia 07/16/2023   Bone aches 06/25/2023   Chronic constipation 05/26/2023   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   HTN (hypertension) 05/25/2022   HLD (hyperlipidemia) 05/25/2022   CAP (community acquired pneumonia) 05/25/2022   Genetic testing 04/12/2021   Family history of melanoma 03/28/2021   Family history of colon cancer 03/28/2021   Malignant neoplasm of overlapping sites of right breast in female, estrogen receptor positive (HCC) 03/05/2021   S/P Nissen fundoplication (with gastrostomy tube placement) (HCC) 02/06/2016   GERD with stricture 06/26/2011   History of colonic polyps 06/26/2011    PCP: Shepard Ade, MD  REFERRING PROVIDER: Zuleta, Kaitlin G, NP   REFERRING DIAG:  N81.3 (ICD-10-CM) - Uterovaginal prolapse, complete  K62.3 (ICD-10-CM) - Rectal prolapse  N81.10 (ICD-10-CM) - Prolapse of anterior vaginal wall  N81.6 (ICD-10-CM) - Prolapse of posterior vaginal wall    THERAPY DIAG:  Muscle weakness (generalized) - Plan: PT plan of care cert/re-cert  Gait instability - Plan: PT plan of care cert/re-cert  Aftercare following surgery for neoplasm - Plan: PT plan of care cert/re-cert  Rationale for Evaluation and Treatment: Rehabilitation  ONSET DATE: 04/15/23  SUBJECTIVE:  SUBJECTIVE STATEMENT: The more I get tired then I leak urine. I can do the exercises most of the time.  .  Prolapse surgery and is 6 weeks out. They found the left fallopian tube cancer. Had her first chemotherapy treatment on 5/8. She will have 6 treatments.  Patient had a CAT scan no no cancer anywhere else.  Fluid intake:  When/What type of surgery did you have? 2. Did you have chemo and/or radiation? If so when started and ended?chemotherapy starts on 06/04/23 for 6 treatments.  3. Do you have neuropathy (if they had chemo?)none 4. Did they take lymph nodes out? If so, how many and what body part? None A.  If you had radiation and/or lymph nodes removed, they are at risk for lymphedema in that quadrant and that should be listed as a precaution B. No heat or ice should be applied to an area previously radiated because of the risk of radiation rebound C. If their arm is at risk for lymphedema, no very repetitive exercises (mainly UBE) because increased blood flow to that body part could bring on swelling (unlikely but you don't want to be the one to bring on lymphedema!) D. No blood pressure checks on the arm at risk for lymphedema E. No ultrasound to anyone with a hx of cancer Further Assess: A. Proximal swelling without distal swelling B. Shortness of breath C. Cellulitis D. Pain with weightbearing  PAIN:  Are you having pain? No  PRECAUTIONS: Other: breast and fallopian tube cancer  RED FLAGS: None   WEIGHT BEARING RESTRICTIONS: No  FALLS:  Has patient fallen in last 6 months? No  OCCUPATION: retired  ACTIVITY LEVEL : walking around the house in circles a few times per day.   PLOF: Independent  PATIENT GOALS: not have to go to the bathroom as much  PERTINENT HISTORY:  Surgery: s/p Robotic assisted total laparoscopic hysterectomy, with bilateral  salpingo-oophorectomy, sacrocolpopexy Lucita Lite Y), perineorrhaphy, cystoscopy on 04/15/23. Postoperative course complicated by diagnosis of incidental fallopian tube cancer- referred to GYN Onc/ Heme- Onc and planning for chemotherapy.right breast cancer estrogen positive; Osteoporosis; IBS; Hypothyroidism; Scoliosis;   Sexual abuse: No  BOWEL MOVEMENT: tendency to constipation Pain with bowel movement: No Type of bowel movement:Type (Bristol Stool Scale) Type 1,3, 4, Frequency if does not have a bowel movement in 2 days then takes Murelax, and Strain yes but not much Fully empty rectum: No only sometimes Leakage: No Fiber supplement/laxative Yes Murelax  URINATION: Pain with urination: No Fully empty bladder: Yes: has to sit a minute to make sure Stream: varies Urgency: Yes , when constipated has more urgency Frequency: 2-3 hours, gets up 1-2 times at night Leakage: Walking to the bathroom and Coughing, bending forward Pads: Yes: 4-5 pads, sometimes has to change pads due to the stool leakage when she takes stuff for her constipation 07/28/23: wears 4 pads per day and no stool leakage 08/25/23: 4 pads  INTERCOURSE: was active prior to surgery  Ability to have vaginal penetration Yes  Pain with intercourse: none  PREGNANCY: C-section deliveries 1   PROLAPSE: None   OBJECTIVE:  Note: Objective measures were completed at Evaluation unless otherwise noted.  DIAGNOSTIC FINDINGS:  none  PATIENT SURVEYS:  PFIQ-7: 22 UIQ-7: 29 : 08/25/23: PFIQ-7: 11 UIQ-7: 14  COGNITION: Overall cognitive status: Within functional limits for tasks assessed     SENSATION: Light touch: Appears intact   FUNCTIONAL TESTS:  5 times sit to stand: 16 sec no hands 2 minute  walk test: 222 feet Single leg stance: right 1 sec, left 4 sec 07/07/23: 2 minute walk test: 306 feet 07/14/23: 2 minute walk test: 340 feet 07/28/23; 2 minute walk test: 364 feet 08/25/23: 5 sit to stand 16 sec, 12 sec,    GAIT: Assistive device utilized: None Comments: decreased heel strike on right  POSTURE: scoliosis   LUMBARAROM/PROM: She has scoliosis   LOWER EXTREMITY ROM:  Passive ROM Right eval Left eval  Hip external rotation 40 40   (Blank rows = not tested)  LOWER EXTREMITY MMT:  MMT Right eval Left eval Right 08/25/23 Left  08/25/23  Hip flexion 4/5 4/5 5/5 5/5  Hip extension 4/5 4/5 4/5 4/5  Hip abduction 3/5 3/5 4/5 4/5  Knee flexion 4/5 4/5 4+/5 4+/5  Knee extension 4/5 4/5 4+/5 4+/5  Ankle dorsiflexion 4/5 4/5 4/5 4/5  Ankle inversion 4/5 4/5 4/5 4/5  Ankle eversion 4/5 4/5 4/5 4/5   (Blank rows = not tested) PALPATION:                External Perineal Exam: intact                             Internal Pelvic Floor: tightness around the introitus  Patient confirms identification and approves PT to assess internal pelvic floor and treatment Yes  PELVIC MMT:   MMT eval 07/14/23  Vaginal 1/5 at first pushed therapist finger out but after therapist gave her verbal cues she was able to contract 2/5 with slight circular hug of therapist finger  (Blank rows = not tested)         TODAY'S TREATMENT:   08/25/23 Exercises: Strengthening: Hip flexion isometric 10 x each leg with lower abdominals engaged SLR with abdominal contraction 10 x 2 each side Supine dead bug 2 x 10 with abdominal contraction Sit to stand 5 x 3 Sitting knee extension with 3 dot band (yellow) 3 x 10  Standing hip flexion holding 3 seconds 2 x 10    07/28/23 Neuromuscular re-education: Down training: Educated patient on the urge to void behavioral technique to get to the bathroom in time Exercises: Strengthening: 2 minute walk test: 364 feet Bridge 10 x with verbal cues to lift her hips all the way.  Hip flexion isometric 10 x each leg with lower abdominals engaged Press ball between hands and bring over head with core engaged 10 x Standing hip flexion with pelvic floor contraction 10 x each  side Standing hip abduction with pelvic floor contraction 10 x each side  07/14/23 Manual: Soft tissue mobilization: Manual mobilization of the perineal body going through the restrictions Manual mobilization along the ischiocavernosus to elongate Internal pelvic floor techniques: No emotional/communication barriers or cognitive limitation. Patient is motivated to learn. Patient understands and agrees with treatment goals and plan. PT explains patient will be examined in standing, sitting, and lying down to see how their muscles and joints work. When they are ready, they will be asked to remove their underwear so PT can examine their perineum. The patient is also given the option of providing their own chaperone as one is not provided in our facility. The patient also has the right and is explained the right to defer or refuse any part of the evaluation or treatment including the internal exam. With the patient's consent, PT will use one gloved finger to gently assess the muscles of the pelvic floor, seeing how well it  contracts and relaxes and if there is muscle symmetry. After, the patient will get dressed and PT and patient will discuss exam findings and plan of care. PT and patient discuss plan of care, schedule, attendance policy and HEP activities.  Therapist gloved finger in the vaginal canal working on the perineal body, sides of the introitus, levator ani and obturator internist to elongate for better quality contraction Neuromuscular re-education: Pelvic floor contraction training: Therapist gloved finger in the vaginal canal giving tactile cues to the pelvic floor muscles for contraction and breathing out with contraction Sitting pelvic floor contraction with breathing out Exercises: Strengthening: 2 minute walk test for 340 feet      PATIENT EDUCATION:  08/25/23 Education details: Access Code: [email or web address], education on abdominal massage and toileting Person educated: Patient Education  method: Explanation, Demonstration, Tactile cues, Verbal cues, and Handouts Education comprehension: verbalized understanding, returned demonstration, verbal cues required, tactile cues required, and needs further education  HOME EXERCISE PROGRAM: 08/25/23 Access Code: [email or web address] URL: [email or web address] Date: 08/25/2023 Prepared by: Channing Pereyra  Exercises - Seated Pelvic Floor Contraction  - 4 x daily - 7 x weekly - 1 sets - 5 reps - Supine Diaphragmatic Breathing  - 1 x daily - 7 x weekly - 3 sets - 10 reps - Seated Diaphragmatic Breathing  - 1 x daily - 7 x weekly - 3 sets - 10 reps - Supine Shoulder Overhead Flexion with Ball  - 1 x daily - 3 x weekly - 2 sets - 10 reps - Standing Marching  - 1 x daily - 3 x weekly - 1 sets - 10 reps - Standing Hip Abduction  - 1 x daily - 3 x weekly - 1 sets - 10 reps - Dead Bug  - 1 x daily - 3 x weekly - 2 sets - 10 reps - Straight Leg Raise  - 1 x daily - 3 x weekly - 2 sets - 10 reps  Patient Education - Abdominal Massage for Constipation - Abdominal Massage for Constipation  ASSESSMENT:  CLINICAL IMPRESSION: Patient is a 78 y.o. female who was seen today for physical therapy  treatment for therapy after vaginal prolaspe surgery on 04/15/23. She then was diagnosed with left fallopian tube cancer. She started chemotherapy on 06/04/23 and will have 6 treatments. She will leak more urine after her chemotherapy treatments due to being weak. She wears 4 pads per day. Patient is able to go from sit to stand in 12 sec. Stool leakage is 90% better. .Patient is not able to do the bridge exercise due to increase back pain.  Patient is able to walk to the bathroom without leaking urine 50% of the time. Patient has increased in lower extremity strength. Patient would benefit from skilled therapy to improve pelvic floor strength and overall strength.   OBJECTIVE IMPAIRMENTS: Abnormal gait, decreased activity tolerance, decreased balance, decreased  coordination, difficulty walking, decreased strength, increased fascial restrictions, and increased muscle spasms.   ACTIVITY LIMITATIONS: lifting, bending, bed mobility, continence, and locomotion level  PARTICIPATION LIMITATIONS: cleaning, laundry, shopping, and community activity  PERSONAL FACTORS: Age, Time since onset of injury/illness/exacerbation, and 3+ comorbidities: Surgery: s/p Robotic assisted total laparoscopic hysterectomy, with bilateral salpingo-oophorectomy, sacrocolpopexy Lucita Lite Y), perineorrhaphy, cystoscopy on 04/15/23. Postoperative course complicated by diagnosis of incidental fallopian tube cancer- referred to GYN Onc/ Heme- Onc and planning for chemotherapy.right breast cancer estrogen positive; Osteoporosis; IBS; Hypothyroidism; Scoliosis;   are also affecting patient's functional outcome.   REHAB POTENTIAL: Good  CLINICAL  DECISION MAKING: Evolving/moderate complexity  EVALUATION COMPLEXITY: Moderate   GOALS: Goals reviewed with patient? Yes  SHORT TERM GOALS: Target date: 07/21/23  Patient independent with initial HEP for pelvic floor strength.  Baseline: Goal status:Met 07/14/23 2.  Patient able to perform diaphragmatic breathing to elongate the pelvic floor.  Baseline:  Goal status: Met 07/07/23  3.  Patient educated on urgency to void behavioral technique.  Baseline:  Goal status: Met 07/28/23   LONG TERM GOALS: Target date: 12/08/23  Patient independent with advanced HEP for core, pelvic floor and overall strength.  Baseline:  Goal status: Ongoing 08/25/23  2.  Patient reports she wears 1 or less pads per days due to reduction of urinary leakage with walking.  Baseline: wears 4 pad Goal status:ongoing 08/25/23  3.  Patient is able to walk to the bathroom with no urinary leakage due to using the behavioral techniques and increased in pelvic floor strength.  Baseline: improved by 50% Goal status: ongoing 08/25/23  4.  Patient is able to walk  >/= 462 feet in 2 minutes to reduce the risk of falling as she is walking to the bathroom.  Baseline:  Goal status: ongoing 08/25/23  5.  Sit to stand 5 times </= 12.6 seconds to assist getting up from chair safely to walk to the bathroom when she has urgency.  Baseline: 12 sec Goal status: Met 08/25/23   PLAN:  PT FREQUENCY: 1-2x/week  PT DURATION: 12 weeks  PLANNED INTERVENTIONS: 97110-Therapeutic exercises, 97530- Therapeutic activity, 97112- Neuromuscular re-education, 97535- Self Care, [id number]- Manual therapy, Patient/Family education, Balance training, Dry Needling, and Biofeedback  PLAN FOR NEXT SESSION: 2 minutes walk test,  sit to stand,  see how the urge to void is going  Channing Pereyra, PT 08/25/23 3:49 PM

## 2023-09-01 ENCOUNTER — Encounter: Payer: Self-pay | Admitting: Physical Therapy

## 2023-09-01 ENCOUNTER — Encounter: Payer: Self-pay | Attending: Obstetrics and Gynecology | Admitting: Physical Therapy

## 2023-09-01 DIAGNOSIS — R2681 Unsteadiness on feet: Secondary | ICD-10-CM | POA: Diagnosis present

## 2023-09-01 DIAGNOSIS — Z483 Aftercare following surgery for neoplasm: Secondary | ICD-10-CM | POA: Diagnosis present

## 2023-09-01 DIAGNOSIS — M6281 Muscle weakness (generalized): Secondary | ICD-10-CM | POA: Insufficient documentation

## 2023-09-01 NOTE — Therapy (Signed)
 OUTPATIENT PHYSICAL THERAPY FEMALE PELVIC TREATMENT   Patient Name: Jacqueline Ortiz MRN: [id number] DOB:11-Feb-1945, 78 y.o., female Today's Date: 09/01/2023     END OF SESSION:  PT End of Session - 09/01/23 1141     Visit Number 6    Date for PT Re-Evaluation 12/08/23    Authorization Type [employer] medicare    Authorization Time Period 06/23/2023 - 09/15/2023    Authorization - Number of Visits 6    Progress Note Due on Visit 10    PT Start Time 1130    PT Stop Time 1215    PT Time Calculation (min) 45 min    Activity Tolerance Patient tolerated treatment well    Behavior During Therapy [facility] for tasks assessed/performed          Past Medical History:  Diagnosis Date   Complete uterovaginal prolapse    Diverticulosis of colon    Dry eyes, bilateral 2000   Duodenal diverticulum    Dyslipidemia 2008   Eczema 1993   GAD (generalized anxiety disorder)    Hemorrhoids 2003   internal and external- remains- treats as needed with topical gels   History of adenomatous polyp of colon    followed by  Dr. Lupita Commander   History of hiatal hernia    large HH  by EGD s/p  [facility] repair and nissen fundoplication 02-06-2016 by dr rubin   History of kidney stones    1980s   Hyperlipidemia 2008   Hypertension 2021   Hypothyroidism    IBS (irritable bowel syndrome)    Malignant neoplasm of overlapping sites of right breast in female, estrogen receptor positive (HCC) 02/2021   oncologist--- dr catrina surgeon-- dr vanderbilt;   dx 02/ 2023;  Stage 1A3,  ER/PR+;  HG DCIS,  nodes negative;  04/ 05/ 2023  s/p  right lumpectomy w/ node dissecton's;  completed radiation 06-26-2021;  forego antiestrogen therapy   OA (osteoarthritis)    Osteoporosis 2003   Panic disorder    patient reports anxiety most of her life   Prolapse of vaginal walls    anterior and posterior   S/P dilatation of esophageal stricture 2008   s/p   2008/  2010   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 Lupita Commander   CYSTOSCOPY N/A 04/15/2023   Procedure: CYSTOSCOPY;  Surgeon: Marilynne Rosaline SAILOR, MD;  Location: [facility] OR;  Service: Gynecology;  Laterality: N/A;   DILATION AND CURETTAGE OF UTERUS     yrs ago   ESOPHAGOGASTRODUODENOSCOPY N/A 10/21/2015   Procedure: ESOPHAGOGASTRODUODENOSCOPY (EGD);  Surgeon: Toribio SHAUNNA Cedar, MD;  Location: [facility];  Service: Endoscopy;  Laterality: N/A;   IR IMAGING GUIDED PORT  INSERTION  05/21/2023   LARYNX SURGERY  1993   pt stated due to left vocal cord paralized (unknown cause) told splints placed   PATELLA FRACTURE SURGERY Right 1996   has retained hardware   PERINEOPLASTY N/A 04/15/2023   Procedure: PERINEOPLASTY;  Surgeon: Marilynne Rosaline SAILOR, MD;  Location: [facility] OR;  Service: Gynecology;  Laterality: N/A;   ROBOTIC ASSISTED LAPAROSCOPIC NISSEN FUNDOPLICATION  02/06/2016   @[facility]  by dr rubin;  ROBOTIC ASSISTED HIATAL HERNIA REPAIR WITH MESH & NISSEN FUNDOPLICATION/  GASTROSTOMY TUBE   ROBOTIC ASSISTED LAPAROSCOPIC SACROCOLPOPEXY N/A 04/15/2023   Procedure: SACROCOLPOPEXY, ROBOT-ASSISTED, LAPAROSCOPIC;  Surgeon: Marilynne Rosaline SAILOR, MD;  Location: [facility] OR;  Service: Gynecology;  Laterality: N/A;   ROBOTIC ASSISTED  TOTAL HYSTERECTOMY WITH BILATERAL SALPINGO OOPHERECTOMY N/A 04/15/2023   Procedure: ROBOTIC ASSISTED TOTAL HYSTERECTOMY WITH BILATERAL SALPINGO OOPHORECTOMY;  Surgeon: Marilynne Rosaline SAILOR, MD;  Location: [facility] OR;  Service: Gynecology;  Laterality: N/A;  Total time requested is 3.5hrs   SENTINEL NODE BIOPSY Right 05/01/2021   Procedure: SENTINEL NODE BIOPSY;  Surgeon: Vanderbilt Ned, MD;  Location: [facility] OR;  Service: General;  Laterality: Right;   TONSILLECTOMY  1953   age 38   TUBAL LIGATION Bilateral 1977   Patient Active Problem List   Diagnosis Date Noted   Thrombocytopenia (HCC) 07/16/2023   Drug-induced hyperglycemia 07/16/2023   Bone aches 06/25/2023   Chronic constipation 05/26/2023   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   HTN (hypertension) 05/25/2022   HLD (hyperlipidemia) 05/25/2022   CAP (community acquired pneumonia) 05/25/2022   Genetic testing 04/12/2021   Family history of melanoma 03/28/2021   Family history of colon cancer 03/28/2021   Malignant neoplasm of overlapping sites of right breast in female, estrogen receptor positive (HCC) 03/05/2021   S/P Nissen fundoplication (with gastrostomy tube placement) (HCC) 02/06/2016   GERD with stricture 06/26/2011   History of colonic polyps 06/26/2011    PCP: Shepard Ade, MD  REFERRING PROVIDER: Zuleta, Kaitlin G, NP   REFERRING DIAG:  N81.3 (ICD-10-CM) - Uterovaginal prolapse, complete  K62.3 (ICD-10-CM) - Rectal prolapse  N81.10 (ICD-10-CM) - Prolapse of anterior vaginal wall  N81.6 (ICD-10-CM) - Prolapse of posterior vaginal wall    THERAPY DIAG:  Muscle weakness (generalized)  Gait instability  Aftercare following surgery for neoplasm  Rationale for Evaluation and Treatment: Rehabilitation  ONSET DATE: 04/15/23  SUBJECTIVE:                                                                                                                                                                                            SUBJECTIVE STATEMENT: The more I get  tired then I leak urine. I can do the exercises most of the time.  .  Prolapse surgery and is 6 weeks out. They found the left fallopian tube cancer. Had her first chemotherapy treatment on 5/8. She will have 6 treatments.  Patient had a CAT scan no no cancer anywhere else.  Fluid intake:  When/What type of surgery did you have? 2. Did you have chemo and/or radiation? If so when started and ended?chemotherapy starts on 06/04/23 for 6 treatments.  3. Do you have neuropathy (if they had chemo?)none 4. Did they take lymph nodes out? If so, how many and what body part? None A.  If you had radiation and/or lymph nodes removed, they are at risk for lymphedema in that quadrant and that should be listed as a precaution B. No heat or ice should be applied to an area previously radiated because of the risk of radiation rebound C. If their arm is at risk for lymphedema, no very repetitive exercises (mainly UBE) because increased blood flow to that body part could bring on swelling (unlikely but you don't want to be the one to bring on lymphedema!) D. No blood pressure checks on the arm at risk for lymphedema E. No ultrasound to anyone with a hx of cancer Further Assess: A. Proximal swelling without distal swelling B. Shortness of breath C. Cellulitis D. Pain with weightbearing  PAIN:  Are you having pain? No  PRECAUTIONS: Other: breast and fallopian tube cancer  RED FLAGS: None   WEIGHT BEARING RESTRICTIONS: No  FALLS:  Has patient fallen in last 6 months? No  OCCUPATION: retired  ACTIVITY LEVEL : walking around the house in circles a few times per day.   PLOF: Independent  PATIENT GOALS: not have to go to the bathroom as much  PERTINENT HISTORY:  Surgery: s/p Robotic assisted total laparoscopic hysterectomy, with bilateral salpingo-oophorectomy, sacrocolpopexy Lucita Lite Y), perineorrhaphy, cystoscopy on 04/15/23. Postoperative  course complicated by diagnosis of incidental fallopian tube cancer- referred to GYN Onc/ Heme- Onc and planning for chemotherapy.right breast cancer estrogen positive; Osteoporosis; IBS; Hypothyroidism; Scoliosis;   Sexual abuse: No  BOWEL MOVEMENT: tendency to constipation Pain with bowel movement: No Type of bowel movement:Type (Bristol Stool Scale) Type 1,3, 4, Frequency if does not have a bowel movement in 2 days then takes Murelax, and Strain yes but not much Fully empty rectum: No only sometimes Leakage: No Fiber supplement/laxative Yes Murelax  URINATION: Pain with urination: No Fully empty bladder: Yes: has to sit a minute to make sure Stream: varies Urgency: Yes , when constipated has more urgency Frequency: 2-3 hours, gets up 1-2 times at night Leakage: Walking to the bathroom and Coughing, bending forward Pads: Yes: 4-5 pads, sometimes has to change pads due to the stool leakage when she takes stuff for her constipation 07/28/23: wears 4 pads per day and no stool leakage 08/25/23: 4 pads  INTERCOURSE: was active prior to surgery  Ability to have vaginal penetration Yes  Pain with intercourse: none  PREGNANCY: C-section deliveries 1   PROLAPSE: None   OBJECTIVE:  Note: Objective measures were completed at Evaluation unless otherwise noted.  DIAGNOSTIC FINDINGS:  none  PATIENT SURVEYS:  PFIQ-7: 22 UIQ-7: 29 : 08/25/23: PFIQ-7: 11 UIQ-7: 14  COGNITION: Overall cognitive status: Within functional limits for tasks assessed     SENSATION: Light touch: Appears intact   FUNCTIONAL TESTS:  5 times sit to stand: 16 sec no hands 2 minute walk test: 222 feet Single leg  stance: right 1 sec, left 4 sec 07/07/23: 2 minute walk test: 306 feet 07/14/23: 2 minute walk test: 340 feet 07/28/23; 2 minute walk test: 364 feet 08/25/23: 5 sit to stand 16 sec, 12 sec,   GAIT: Assistive device utilized: None Comments: decreased heel strike on right  POSTURE:  scoliosis   LUMBARAROM/PROM: She has scoliosis   LOWER EXTREMITY ROM:  Passive ROM Right eval Left eval  Hip external rotation 40 40   (Blank rows = not tested)  LOWER EXTREMITY MMT:  MMT Right eval Left eval Right 08/25/23 Left  08/25/23  Hip flexion 4/5 4/5 5/5 5/5  Hip extension 4/5 4/5 4/5 4/5  Hip abduction 3/5 3/5 4/5 4/5  Knee flexion 4/5 4/5 4+/5 4+/5  Knee extension 4/5 4/5 4+/5 4+/5  Ankle dorsiflexion 4/5 4/5 4/5 4/5  Ankle inversion 4/5 4/5 4/5 4/5  Ankle eversion 4/5 4/5 4/5 4/5   (Blank rows = not tested) PALPATION:                External Perineal Exam: intact                             Internal Pelvic Floor: tightness around the introitus  Patient confirms identification and approves PT to assess internal pelvic floor and treatment Yes  PELVIC MMT:   MMT eval 07/14/23 09/01/23  Vaginal 1/5 at first pushed therapist finger out but after therapist gave her verbal cues she was able to contract 2/5 with slight circular hug of therapist finger Started as a 1/5 then went to a 2/5  (Blank rows = not tested)         TODAY'S TREATMENT:   09/01/23 Manual: Internal pelvic floor techniques: No emotional/communication barriers or cognitive limitation. Patient is motivated to learn. Patient understands and agrees with treatment goals and plan. PT explains patient will be examined in standing, sitting, and lying down to see how their muscles and joints work. When they are ready, they will be asked to remove their underwear so PT can examine their perineum. The patient is also given the option of providing their own chaperone as one is not provided in our facility. The patient also has the right and is explained the right to defer or refuse any part of the evaluation or treatment including the internal exam. With the patient's consent, PT will use one gloved finger to gently assess the muscles of the pelvic floor, seeing how well it contracts and relaxes and if there is  muscle symmetry. After, the patient will get dressed and PT and patient will discuss exam findings and plan of care. PT and patient discuss plan of care, schedule, attendance policy and HEP activities.  Going through the vaginal  canal with gloved finger working on the levator ani, urethra sphincter, pubovaginalis, introitus and urogenital diaphragm to elongate for a contraction Neuromuscular re-education: Core retraining: Dead bug with knees at 90/90 and then extend 10 x each side with assistance with coordination of limbs Pelvic floor contraction training: Therapist gloved finger in the vaginal canal working on a contraction with tapping to the muscle, a quick stretch, verbal cues to pick up a Jelly bean until she could isolate the contraction and not use her gluteals.  Reviewed with patient the urge to void behavioral technique to deter her urinary urgency. She is working on it at home but still struggling with it Exercises: Strengthening: Sit to stand 5 x : 13  sec, 12 sec, 11 sec     08/25/23 Exercises: Strengthening: Hip flexion isometric 10 x each leg with lower abdominals engaged SLR with abdominal contraction 10 x 2 each side Supine dead bug 2 x 10 with abdominal contraction Sit to stand 5 x 3 Sitting knee extension with 3 dot band (yellow) 3 x 10  Standing hip flexion holding 3 seconds 2 x 10    07/28/23 Neuromuscular re-education: Down training: Educated patient on the urge to void behavioral technique to get to the bathroom in time Exercises: Strengthening: 2 minute walk test: 364 feet Bridge 10 x with verbal cues to lift her hips all the way.  Hip flexion isometric 10 x each leg with lower abdominals engaged Press ball between hands and bring over head with core engaged 10 x Standing hip flexion with pelvic floor contraction 10 x each side Standing hip abduction with pelvic floor contraction 10 x each side  07/14/23 Manual: Soft tissue mobilization: Manual mobilization  of the perineal body going through the restrictions Manual mobilization along the ischiocavernosus to elongate Internal pelvic floor techniques: No emotional/communication barriers or cognitive limitation. Patient is motivated to learn. Patient understands and agrees with treatment goals and plan. PT explains patient will be examined in standing, sitting, and lying down to see how their muscles and joints work. When they are ready, they will be asked to remove their underwear so PT can examine their perineum. The patient is also given the option of providing their own chaperone as one is not provided in our facility. The patient also has the right and is explained the right to defer or refuse any part of the evaluation or treatment including the internal exam. With the patient's consent, PT will use one gloved finger to gently assess the muscles of the pelvic floor, seeing how well it contracts and relaxes and if there is muscle symmetry. After, the patient will get dressed and PT and patient will discuss exam findings and plan of care. PT and patient discuss plan of care, schedule, attendance policy and HEP activities.  Therapist gloved finger in the vaginal canal working on the perineal body, sides of the introitus, levator ani and obturator internist to elongate for better quality contraction Neuromuscular re-education: Pelvic floor contraction training: Therapist gloved finger in the vaginal canal giving tactile cues to the pelvic floor muscles for contraction and breathing out with contraction Sitting pelvic floor contraction with breathing out Exercises: Strengthening: 2 minute walk test for 340 feet      PATIENT EDUCATION:  08/25/23 Education details: Access Code: [email or web address], education on abdominal massage and toileting Person educated: Patient Education method: Explanation, Demonstration, Tactile cues, Verbal cues, and Handouts Education comprehension: verbalized understanding, returned  demonstration, verbal cues required, tactile cues required, and needs further education  HOME EXERCISE PROGRAM: 08/25/23 Access Code: [email or web address] URL: [email or web address] Date: 08/25/2023 Prepared by: Channing Pereyra  Exercises - Seated Pelvic Floor Contraction  - 4 x daily - 7 x weekly - 1 sets - 5 reps - Supine Diaphragmatic Breathing  - 1 x daily - 7 x weekly - 3 sets - 10 reps - Seated Diaphragmatic Breathing  - 1 x daily - 7 x weekly - 3 sets - 10 reps - Supine Shoulder Overhead Flexion with Ball  - 1 x daily - 3 x weekly - 2 sets - 10 reps - Standing Marching  - 1 x daily - 3 x weekly - 1 sets - 10 reps - Standing Hip Abduction  - 1  x daily - 3 x weekly - 1 sets - 10 reps - Dead Bug  - 1 x daily - 3 x weekly - 2 sets - 10 reps - Straight Leg Raise  - 1 x daily - 3 x weekly - 2 sets - 10 reps  Patient Education - Abdominal Massage for Constipation - Abdominal Massage for Constipation  ASSESSMENT:  CLINICAL IMPRESSION: Patient is a 78 y.o. female who was seen today for physical therapy  treatment for therapy after vaginal prolaspe surgery on 04/15/23. She then was diagnosed with left fallopian tube cancer. She started chemotherapy on 06/04/23 and will have 6 treatments. She will leak more urine after her chemotherapy treatments due to being weak.  Patient is able to go from sit to stand in 12 sec. She has difficulty with isolating the pelvic floor contraction and will use her gluteals instead. She does best with the verbal cue to pick up a Jelly bean. Her pelvic floor strength was 1/5 initially and went to 2/5 during treatment today.  Patient would benefit from skilled therapy to improve pelvic floor strength and overall strength.   OBJECTIVE IMPAIRMENTS: Abnormal gait, decreased activity tolerance, decreased balance, decreased coordination, difficulty walking, decreased strength, increased fascial restrictions, and increased muscle spasms.   ACTIVITY LIMITATIONS: lifting,  bending, bed mobility, continence, and locomotion level  PARTICIPATION LIMITATIONS: cleaning, laundry, shopping, and community activity  PERSONAL FACTORS: Age, Time since onset of injury/illness/exacerbation, and 3+ comorbidities: Surgery: s/p Robotic assisted total laparoscopic hysterectomy, with bilateral salpingo-oophorectomy, sacrocolpopexy Lucita Lite Y), perineorrhaphy, cystoscopy on 04/15/23. Postoperative course complicated by diagnosis of incidental fallopian tube cancer- referred to GYN Onc/ Heme- Onc and planning for chemotherapy.right breast cancer estrogen positive; Osteoporosis; IBS; Hypothyroidism; Scoliosis;   are also affecting patient's functional outcome.   REHAB POTENTIAL: Good  CLINICAL DECISION MAKING: Evolving/moderate complexity  EVALUATION COMPLEXITY: Moderate   GOALS: Goals reviewed with patient? Yes  SHORT TERM GOALS: Target date: 07/21/23  Patient independent with initial HEP for pelvic floor strength.  Baseline: Goal status:Met 07/14/23 2.  Patient able to perform diaphragmatic breathing to elongate the pelvic floor.  Baseline:  Goal status: Met 07/07/23  3.  Patient educated on urgency to void behavioral technique.  Baseline:  Goal status: Met 07/28/23   LONG TERM GOALS: Target date: 12/08/23  Patient independent with advanced HEP for core, pelvic floor and overall strength.  Baseline:  Goal status: Ongoing 08/25/23  2.  Patient reports she wears 1 or less pads per days due to reduction of urinary leakage with walking.  Baseline: wears 4 pad Goal status:ongoing 08/25/23  3.  Patient is able to walk to the bathroom with no urinary leakage due to using the behavioral techniques and increased in pelvic floor strength.  Baseline: improved by 50% Goal status: ongoing 08/25/23  4.  Patient is able to walk >/= 462 feet in 2 minutes to reduce the risk of falling as she is walking to the bathroom.  Baseline:  Goal status: ongoing 08/25/23  5.  Sit to  stand 5 times </= 12.6 seconds to assist getting up from chair safely to walk to the bathroom when she has urgency.  Baseline: 12 sec Goal status: Met 08/25/23   PLAN:  PT FREQUENCY: 1-2x/week  PT DURATION: 12 weeks  PLANNED INTERVENTIONS: 97110-Therapeutic exercises, 97530- Therapeutic activity, 97112- Neuromuscular re-education, 97535- Self Care, [id number]- Manual therapy, Patient/Family education, Balance training, Dry Needling, and Biofeedback  PLAN FOR NEXT SESSION: 2 minutes walk test,   see how  the urge to void is going, possible electrical stimulation   Channing Pereyra, PT 09/01/23 1:01 PM

## 2023-09-08 ENCOUNTER — Inpatient Hospital Stay: Attending: Adult Health

## 2023-09-08 ENCOUNTER — Inpatient Hospital Stay

## 2023-09-08 ENCOUNTER — Encounter: Payer: Self-pay | Admitting: Hematology and Oncology

## 2023-09-08 ENCOUNTER — Inpatient Hospital Stay (HOSPITAL_BASED_OUTPATIENT_CLINIC_OR_DEPARTMENT_OTHER): Admitting: Hematology and Oncology

## 2023-09-08 VITALS — BP 140/83 | HR 92 | Temp 98.1°F | Resp 18 | Ht 63.0 in | Wt 128.6 lb

## 2023-09-08 DIAGNOSIS — C5702 Malignant neoplasm of left fallopian tube: Secondary | ICD-10-CM

## 2023-09-08 DIAGNOSIS — D61818 Other pancytopenia: Secondary | ICD-10-CM | POA: Insufficient documentation

## 2023-09-08 DIAGNOSIS — K5900 Constipation, unspecified: Secondary | ICD-10-CM | POA: Diagnosis not present

## 2023-09-08 DIAGNOSIS — Z5111 Encounter for antineoplastic chemotherapy: Secondary | ICD-10-CM | POA: Insufficient documentation

## 2023-09-08 LAB — CBC WITH DIFFERENTIAL (CANCER CENTER ONLY)
Abs Immature Granulocytes: 0.08 K/uL — ABNORMAL HIGH (ref 0.00–0.07)
Basophils Absolute: 0 K/uL (ref 0.0–0.1)
Basophils Relative: 0 %
Eosinophils Absolute: 0 K/uL (ref 0.0–0.5)
Eosinophils Relative: 0 %
HCT: 34.5 % — ABNORMAL LOW (ref 36.0–46.0)
Hemoglobin: 11.7 g/dL — ABNORMAL LOW (ref 12.0–15.0)
Immature Granulocytes: 2 %
Lymphocytes Relative: 11 %
Lymphs Abs: 0.6 K/uL — ABNORMAL LOW (ref 0.7–4.0)
MCH: 33 pg (ref 26.0–34.0)
MCHC: 33.9 g/dL (ref 30.0–36.0)
MCV: 97.2 fL (ref 80.0–100.0)
Monocytes Absolute: 0.1 K/uL (ref 0.1–1.0)
Monocytes Relative: 1 %
Neutro Abs: 4.3 K/uL (ref 1.7–7.7)
Neutrophils Relative %: 86 %
Platelet Count: 105 K/uL — ABNORMAL LOW (ref 150–400)
RBC: 3.55 MIL/uL — ABNORMAL LOW (ref 3.87–5.11)
RDW: 20.3 % — ABNORMAL HIGH (ref 11.5–15.5)
WBC Count: 5 K/uL (ref 4.0–10.5)
nRBC: 0 % (ref 0.0–0.2)

## 2023-09-08 LAB — CMP (CANCER CENTER ONLY)
ALT: 16 U/L (ref 0–44)
AST: 18 U/L (ref 15–41)
Albumin: 4.1 g/dL (ref 3.5–5.0)
Alkaline Phosphatase: 111 U/L (ref 38–126)
Anion gap: 10 (ref 5–15)
BUN: 13 mg/dL (ref 8–23)
CO2: 26 mmol/L (ref 22–32)
Calcium: 9.5 mg/dL (ref 8.9–10.3)
Chloride: 102 mmol/L (ref 98–111)
Creatinine: 0.79 mg/dL (ref 0.44–1.00)
GFR, Estimated: >60 mL/min (ref >60)
Glucose, Bld: 240 mg/dL — ABNORMAL HIGH (ref 70–99)
Potassium: 3.9 mmol/L (ref 3.5–5.1)
Sodium: 138 mmol/L (ref 135–145)
Total Bilirubin: 0.4 mg/dL (ref 0.0–1.2)
Total Protein: 6.9 g/dL (ref 6.5–8.1)

## 2023-09-08 MED ORDER — PALONOSETRON HCL INJECTION 0.25 MG/5ML
0.2500 mg | Freq: Once | INTRAVENOUS | Status: AC
  Administered 2023-09-08 (×2): 0.25 mg via INTRAVENOUS
  Filled 2023-09-08: qty 5

## 2023-09-08 MED ORDER — SODIUM CHLORIDE 0.9% FLUSH
10.0000 mL | Freq: Once | INTRAVENOUS | Status: AC
Start: 2023-09-08 — End: 2023-09-08
  Administered 2023-09-08 (×2): 10 mL

## 2023-09-08 MED ORDER — FAMOTIDINE IN NACL 20-0.9 MG/50ML-% IV SOLN
20.0000 mg | Freq: Once | INTRAVENOUS | Status: AC
  Administered 2023-09-08 (×2): 20 mg via INTRAVENOUS
  Filled 2023-09-08: qty 50

## 2023-09-08 MED ORDER — SODIUM CHLORIDE 0.9 % IV SOLN: INTRAVENOUS | Status: DC

## 2023-09-08 MED ORDER — CETIRIZINE HCL 10 MG/ML IV SOLN
10.0000 mg | Freq: Once | INTRAVENOUS | Status: AC
  Administered 2023-09-08 (×2): 10 mg via INTRAVENOUS
  Filled 2023-09-08: qty 1

## 2023-09-08 MED ORDER — SODIUM CHLORIDE 0.9 % IV SOLN
343.0000 mg | Freq: Once | INTRAVENOUS | Status: AC
  Administered 2023-09-08 (×2): 340 mg via INTRAVENOUS
  Filled 2023-09-08: qty 34

## 2023-09-08 MED ORDER — SODIUM CHLORIDE 0.9 % IV SOLN
175.0000 mg/m2 | Freq: Once | INTRAVENOUS | Status: AC
  Administered 2023-09-08 (×2): 276 mg via INTRAVENOUS
  Filled 2023-09-08: qty 46

## 2023-09-08 MED ORDER — SODIUM CHLORIDE 0.9 % IV SOLN
150.0000 mg | Freq: Once | INTRAVENOUS | Status: AC
  Administered 2023-09-08 (×2): 150 mg via INTRAVENOUS
  Filled 2023-09-08: qty 150

## 2023-09-08 MED ORDER — DEXAMETHASONE SODIUM PHOSPHATE 10 MG/ML IJ SOLN
10.0000 mg | Freq: Once | INTRAMUSCULAR | Status: AC
  Administered 2023-09-08 (×2): 10 mg via INTRAVENOUS
  Filled 2023-09-08: qty 1

## 2023-09-08 NOTE — Assessment & Plan Note (Addendum)
 We will proceed with treatment today For her next cycle, I plan to delay by 1 week given recurrent pancytopenia and she agreed with the plan of care

## 2023-09-08 NOTE — Patient Instructions (Signed)
 [facility] - A DEPT OF [name]  Discharge Instructions: Thank you for choosing [facility] to provide your oncology and hematology care.   If you have a lab appointment with the Cancer Center, please go directly to the Cancer Center and check in at the registration area.   Wear comfortable clothing and clothing appropriate for easy access to any Portacath or PICC line.   We strive to give you quality time with your provider. You may need to reschedule your appointment if you arrive late (15 or more minutes).  Arriving late affects you and other patients whose appointments are after yours.  Also, if you miss three or more appointments without notifying the office, you may be dismissed from the clinic at the provider's discretion.      For prescription refill requests, have your pharmacy contact our office and allow 72 hours for refills to be completed.    Today you received the following chemotherapy and/or immunotherapy agents: CARBOplatin  (PARAPLATIN ) , PACLitaxel  (TAXOL )     To help prevent nausea and vomiting after your treatment, we encourage you to take your nausea medication as directed.  BELOW ARE SYMPTOMS THAT SHOULD BE REPORTED IMMEDIATELY: *FEVER GREATER THAN 100.4 F (38 C) OR HIGHER *CHILLS OR SWEATING *NAUSEA AND VOMITING THAT IS NOT CONTROLLED WITH YOUR NAUSEA MEDICATION *UNUSUAL SHORTNESS OF BREATH *UNUSUAL BRUISING OR BLEEDING *URINARY PROBLEMS (pain or burning when urinating, or frequent urination) *BOWEL PROBLEMS (unusual diarrhea, constipation, pain near the anus) TENDERNESS IN MOUTH AND THROAT WITH OR WITHOUT PRESENCE OF ULCERS (sore throat, sores in mouth, or a toothache) UNUSUAL RASH, SWELLING OR PAIN  UNUSUAL VAGINAL DISCHARGE OR ITCHING   Items with * indicate a potential emergency and should be followed up as soon as possible or go to the Emergency Department if any problems should occur.  Please show the  CHEMOTHERAPY ALERT CARD or IMMUNOTHERAPY ALERT CARD at check-in to the Emergency Department and triage nurse.  Should you have questions after your visit or need to cancel or reschedule your appointment, please contact [facility] - A DEPT OF [facility]  Dept: [phone]  and follow the prompts.  Office hours are 8:00 a.m. to 4:30 p.m. Monday - Friday. Please note that voicemails left after 4:00 p.m. may not be returned until the following business day.  We are closed weekends and major holidays. You have access to a nurse at all times for urgent questions. Please call the main number to the clinic Dept: [phone] and follow the prompts.   For any non-urgent questions, you may also contact your provider using MyChart. We now offer e-Visits for anyone 77 and older to request care online for non-urgent symptoms. For details visit [email or web address].   Also download the MyChart app! Go to the app store, search MyChart, open the app, select [place], and log in with your MyChart username and password.

## 2023-09-08 NOTE — Assessment & Plan Note (Addendum)
 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, stage II disease status post optimal debulking surgery  Final pathology high-grade serous, p53 mutated, Tumor marker was not helpful  She tolerated cycle 1 of chemotherapy well except for some excessive fatigue, bone aches and constipation which are self-limiting With recent cycles of treatment, she developed fatigue, loose stool, mild thrombocytopenia, steroid-induced hyperglycemia and mild weight loss With cycle 3 of chemotherapy, it caused significant thrombocytopenia After we deferred treatment for 10 days, repeat CBC today came back normal Moving forward, we will proceed with treatment with slight dose reduction of carboplatin  I reviewed her CBC results I recommend her last cycle of treatment to be delayed by 1 week given her recurrent mild pancytopenia and she agreed Will proceed with treatment today without delay

## 2023-09-08 NOTE — Progress Notes (Signed)
 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, stage II disease status post optimal debulking surgery  Final pathology high-grade serous, p53 mutated, Tumor marker was not helpful  She tolerated cycle 1 of chemotherapy well except for some excessive fatigue, bone aches and constipation which are self-limiting With recent cycles of treatment, she developed fatigue, loose stool, mild thrombocytopenia, steroid-induced hyperglycemia and mild weight loss With cycle 3 of chemotherapy, it caused significant thrombocytopenia After we deferred treatment for 10 days, repeat CBC today came back normal Moving forward, we will proceed with treatment with slight dose reduction of carboplatin  I reviewed her CBC results I recommend her last cycle of treatment to be delayed by 1 week given her recurrent mild pancytopenia and she agreed Will proceed with treatment today without delay Pancytopenia, acquired (HCC) We will proceed with treatment today For her next cycle, I plan to delay by 1 week given recurrent pancytopenia and she agreed with the plan of care  No orders of the defined types were placed in this encounter.    Almarie Bedford, MD  INTERVAL HISTORY: she returns for treatment follow-up Complications related to previous cycle of chemotherapy included pancytopenia, and fatigue, She denies recent bruising or bleeding No nausea or changes in bowel habits Denies peripheral neuropathy  PHYSICAL EXAMINATION: ECOG PERFORMANCE STATUS: 1 - Symptomatic but completely ambulatory  Lab Results  Component Value Date   CAN125 17.3 05/11/2023      Latest Ref Rng & Units 09/08/2023    7:46 AM 08/17/2023    9:59 AM 08/06/2023    7:56 AM  CBC   WBC 4.0 - 10.5 K/uL 5.0  4.2  4.2   Hemoglobin 12.0 - 15.0 g/dL 88.2  87.4  88.2   Hematocrit 36.0 - 46.0 % 34.5  37.5  34.7   Platelets 150 - 400 K/uL 105  316  56       Chemistry      Component Value Date/Time   NA 138 09/08/2023 0746   NA 139 04/13/2023 1133   K 3.9 09/08/2023 0746   CL 102 09/08/2023 0746   CO2 26 09/08/2023 0746   BUN 13 09/08/2023 0746   BUN 11 04/13/2023 1133   CREATININE 0.79 09/08/2023 0746      Component Value Date/Time   CALCIUM  9.5 09/08/2023 0746   ALKPHOS 111 09/08/2023 0746   AST 18 09/08/2023 0746   ALT 16 09/08/2023 0746   BILITOT 0.4 09/08/2023 0746       Vitals:   09/08/23 0815  BP: (!) 140/83  Pulse: 92  Resp: 18  Temp: 98.1 F (36.7 C)  SpO2: 94%   Filed Weights   09/08/23 0815  Weight: 128 lb 9.6 oz (58.3 kg)   Other relevant data reviewed during this visit included CBC and CMP

## 2023-09-24 ENCOUNTER — Encounter: Admitting: Physical Therapy

## 2023-09-24 ENCOUNTER — Encounter: Payer: Self-pay | Admitting: Physical Therapy

## 2023-09-29 ENCOUNTER — Ambulatory Visit: Admitting: Hematology and Oncology

## 2023-09-29 ENCOUNTER — Other Ambulatory Visit

## 2023-09-29 ENCOUNTER — Ambulatory Visit

## 2023-10-01 ENCOUNTER — Encounter: Attending: Obstetrics and Gynecology | Admitting: Physical Therapy

## 2023-10-01 ENCOUNTER — Encounter: Payer: Self-pay | Admitting: Physical Therapy

## 2023-10-01 DIAGNOSIS — Z483 Aftercare following surgery for neoplasm: Secondary | ICD-10-CM | POA: Diagnosis present

## 2023-10-01 DIAGNOSIS — R2681 Unsteadiness on feet: Secondary | ICD-10-CM | POA: Insufficient documentation

## 2023-10-01 DIAGNOSIS — M6281 Muscle weakness (generalized): Secondary | ICD-10-CM | POA: Insufficient documentation

## 2023-10-01 NOTE — Therapy (Signed)
 OUTPATIENT PHYSICAL THERAPY FEMALE PELVIC TREATMENT   Patient Name: Jacqueline Ortiz MRN: [id number] DOB:[birth year], 78 y.o., female Today's Date: 10/01/2023     END OF SESSION:  PT End of Session - 10/01/23 1507     Visit Number 7    Date for PT Re-Evaluation 12/08/23    Authorization Type [employer] medicare    Authorization Time Period 09/16/2023 - 10/14/2023    Authorization - Number of Visits 7    Progress Note Due on Visit 10    PT Start Time 1500    PT Stop Time 1545    PT Time Calculation (min) 45 min    Activity Tolerance Patient tolerated treatment well    Behavior During Therapy [facility] for tasks assessed/performed          Past Medical History:  Diagnosis Date   Complete uterovaginal prolapse    Diverticulosis of colon    Dry eyes, bilateral 2000   Duodenal diverticulum    Dyslipidemia 2008   Eczema 1993   GAD (generalized anxiety disorder)    Hemorrhoids 2003   internal and external- remains- treats as needed with topical gels   History of adenomatous polyp of colon    followed by  Dr. Lupita Commander   History of hiatal hernia    large HH  by EGD s/p  [facility] repair and nissen fundoplication 02-06-2016 by dr rubin   History of kidney stones    1980s   Hyperlipidemia 2008   Hypertension 2021   Hypothyroidism    IBS (irritable bowel syndrome)    Malignant neoplasm of overlapping sites of right breast in female, estrogen receptor positive (HCC) 02/2021   oncologist--- dr catrina surgeon-- dr vanderbilt;   dx 02/ 2023;  Stage 1A3,  ER/PR+;  HG DCIS,  nodes negative;  04/ 05/ 2023  s/p  right lumpectomy w/ node dissecton's;  completed radiation 06-26-2021;  forego antiestrogen therapy   OA (osteoarthritis)    Osteoporosis 2003   Panic disorder    patient reports anxiety most of her life   Prolapse of vaginal walls    anterior and posterior   S/P dilatation of esophageal stricture 2008   s/p   2008/  2010   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 Lupita Commander   CYSTOSCOPY N/A 04/15/2023   Procedure: CYSTOSCOPY;  Surgeon: Marilynne Rosaline SAILOR, MD;  Location: [facility] OR;  Service: Gynecology;  Laterality: N/A;   DILATION AND CURETTAGE OF UTERUS     yrs ago   ESOPHAGOGASTRODUODENOSCOPY N/A 10/21/2015   Procedure: ESOPHAGOGASTRODUODENOSCOPY (EGD);  Surgeon: Toribio SHAUNNA Cedar, MD;  Location: [facility];  Service: Endoscopy;  Laterality: N/A;   IR IMAGING GUIDED PORT  INSERTION  05/21/2023   LARYNX SURGERY  1993   pt stated due to left vocal cord paralized (unknown cause) told splints placed   PATELLA FRACTURE SURGERY Right 1996   has retained hardware   PERINEOPLASTY N/A 04/15/2023   Procedure: PERINEOPLASTY;  Surgeon: Marilynne Rosaline SAILOR, MD;  Location: [facility] OR;  Service: Gynecology;  Laterality: N/A;   ROBOTIC ASSISTED LAPAROSCOPIC NISSEN FUNDOPLICATION  02/06/2016   @[facility]  by dr rubin;  ROBOTIC ASSISTED HIATAL HERNIA REPAIR WITH MESH & NISSEN FUNDOPLICATION/  GASTROSTOMY TUBE   ROBOTIC ASSISTED LAPAROSCOPIC SACROCOLPOPEXY N/A 04/15/2023   Procedure: SACROCOLPOPEXY, ROBOT-ASSISTED, LAPAROSCOPIC;  Surgeon: Marilynne Rosaline SAILOR, MD;  Location: [facility] OR;  Service: Gynecology;  Laterality: N/A;   ROBOTIC ASSISTED TOTAL  HYSTERECTOMY WITH BILATERAL SALPINGO OOPHERECTOMY N/A 04/15/2023   Procedure: ROBOTIC ASSISTED TOTAL HYSTERECTOMY WITH BILATERAL SALPINGO OOPHORECTOMY;  Surgeon: Marilynne Rosaline SAILOR, MD;  Location: [facility] OR;  Service: Gynecology;  Laterality: N/A;  Total time requested is 3.5hrs   SENTINEL NODE BIOPSY Right 05/01/2021   Procedure: SENTINEL NODE BIOPSY;  Surgeon: Vanderbilt Ned, MD;  Location: [facility] OR;  Service: General;  Laterality: Right;   TONSILLECTOMY  1953   age 62   TUBAL LIGATION Bilateral 1977   Patient Active Problem List   Diagnosis Date Noted   Pancytopenia, acquired (HCC) 09/08/2023   Thrombocytopenia (HCC) 07/16/2023   Drug-induced hyperglycemia 07/16/2023   Bone aches 06/25/2023   Chronic constipation 05/26/2023   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   HTN (hypertension) 05/25/2022   HLD (hyperlipidemia) 05/25/2022   CAP (community acquired pneumonia) 05/25/2022   Genetic testing 04/12/2021   Family history of melanoma 03/28/2021   Family history of colon cancer 03/28/2021   Malignant neoplasm of overlapping sites of right breast in female, estrogen receptor positive (HCC) 03/05/2021   S/P Nissen fundoplication (with gastrostomy tube placement) (HCC) 02/06/2016   GERD with stricture 06/26/2011   History of colonic polyps 06/26/2011    PCP: Shepard Ade, MD  REFERRING PROVIDER: Zuleta, Kaitlin G, NP   REFERRING DIAG:  N81.3 (ICD-10-CM) - Uterovaginal prolapse, complete  K62.3 (ICD-10-CM) - Rectal prolapse  N81.10 (ICD-10-CM) - Prolapse of anterior vaginal wall  N81.6 (ICD-10-CM) - Prolapse of posterior vaginal wall    THERAPY DIAG:  Muscle weakness (generalized)  Gait instability  Aftercare following surgery for neoplasm  Rationale for Evaluation and Treatment: Rehabilitation  ONSET DATE: 04/15/23  SUBJECTIVE:  SUBJECTIVE STATEMENT: I do no leak much when I have recovered from Chemotherapy.   .  Prolapse surgery and is 6 weeks out. They found the left fallopian tube cancer. Had her first chemotherapy treatment on 5/8. She will have 6 treatments.  Patient had a CAT scan no no cancer anywhere else.  Fluid intake:  When/What type of surgery did you have? 2. Did you have chemo and/or radiation? If so when started and ended?chemotherapy starts on 06/04/23 for 6 treatments.  3. Do you have neuropathy (if they had chemo?)none 4. Did they take lymph nodes out? If so, how many and what body part? None A.  If you had radiation and/or lymph nodes removed, they are at risk for lymphedema in that quadrant and that should be listed as a precaution B. No heat or ice should be applied to an area previously radiated because of the risk of radiation rebound C. If their arm is at risk for lymphedema, no very repetitive exercises (mainly UBE) because increased blood flow to that body part could bring on swelling (unlikely but you don't want to be the one to bring on lymphedema!) D. No blood pressure checks on the arm at risk for lymphedema E. No ultrasound to anyone with a hx of cancer Further Assess: A. Proximal swelling without distal swelling B. Shortness of breath C. Cellulitis D. Pain with weightbearing  PAIN:  Are you having pain? No  PRECAUTIONS: Other: breast and fallopian tube cancer  RED FLAGS: None   WEIGHT BEARING RESTRICTIONS: No  FALLS:  Has patient fallen in last 6 months? No  OCCUPATION: retired  ACTIVITY LEVEL : walking around the house in circles a few times per day.   PLOF: Independent  PATIENT GOALS: not have to go to the bathroom as much  PERTINENT HISTORY:  Surgery: s/p Robotic assisted total laparoscopic hysterectomy, with bilateral salpingo-oophorectomy, sacrocolpopexy Lucita Lite Y), perineorrhaphy, cystoscopy on 04/15/23.  Postoperative course complicated by diagnosis of incidental fallopian tube cancer- referred to GYN Onc/ Heme- Onc and planning for chemotherapy.right breast cancer estrogen positive; Osteoporosis; IBS; Hypothyroidism; Scoliosis;   Sexual abuse: No  BOWEL MOVEMENT: tendency to constipation Pain with bowel movement: No Type of bowel movement:Type (Bristol Stool Scale) Type 1,3, 4, Frequency if does not have a bowel movement in 2 days then takes Murelax, and Strain yes but not much Fully empty rectum: No only sometimes Leakage: No Fiber supplement/laxative Yes Murelax  URINATION: Pain with urination: No Fully empty bladder: Yes: has to sit a minute to make sure Stream: varies Urgency: Yes , when constipated has more urgency 10/01/23: patient is able to get to the bathroom after the urge to void 3 weeks after her chemotherapy treatment Frequency: 2-3 hours, gets up 1-2 times at night Leakage: Walking to the bathroom and Coughing, bending forward Pads: Yes: 4-5 pads, sometimes has to change pads due to the stool leakage when she takes stuff for her constipation 07/28/23: wears 4 pads per day and no stool leakage 08/25/23: 4 pads 10/01/23: 4-5 after chemotherapy then 3 weeks after chemotherapy 3 pads.   INTERCOURSE: was active prior to surgery  Ability to have vaginal penetration Yes  Pain with intercourse: none  PREGNANCY: C-section deliveries 1   PROLAPSE: None   OBJECTIVE:  Note: Objective measures were completed at Evaluation unless otherwise noted.  DIAGNOSTIC FINDINGS:  none  PATIENT SURVEYS:  PFIQ-7: 22 UIQ-7: 29 : 08/25/23: PFIQ-7: 11 UIQ-7: 14 10/01/23: PFIQ-7: 17 UIQ-7: 14  COGNITION: Overall cognitive status:  Within functional limits for tasks assessed     SENSATION: Light touch: Appears intact   FUNCTIONAL TESTS:  5 times sit to stand: 16 sec no hands 2 minute walk test: 222 feet Single leg stance: right 1 sec, left 4 sec 07/07/23: 2 minute walk test: 306  feet 07/14/23: 2 minute walk test: 340 feet 07/28/23; 2 minute walk test: 364 feet 08/25/23: 5 sit to stand 16 sec, 12 sec,  10/01/23: 5 sit to stand 13 sec, 10 sec, 10 sec  2 minute walk test: 392 feet  GAIT: Assistive device utilized: None Comments: decreased heel strike on right  POSTURE: scoliosis   LUMBARAROM/PROM: She has scoliosis   LOWER EXTREMITY ROM:  Passive ROM Right eval Left eval  Hip external rotation 40 40   (Blank rows = not tested)  LOWER EXTREMITY MMT:  MMT Right eval Left eval Right 08/25/23 Left  08/25/23 Right 10/01/23 Left  10/01/23  Hip flexion 4/5 4/5 5/5 5/5 5/5 5/5  Hip extension 4/5 4/5 4/5 4/5 4+/5 4+/5  Hip abduction 3/5 3/5 4/5 4/5 4+/5 4+/5  Knee flexion 4/5 4/5 4+/5 4+/5 4+/5 4+/5  Knee extension 4/5 4/5 4+/5 4+/5 4+/5 4+/5  Ankle dorsiflexion 4/5 4/5 4/5 4/5 5/5 5/5  Ankle inversion 4/5 4/5 4/5 4/5 5/5 5/5  Ankle eversion 4/5 4/5 4/5 4/5 5/5 5/5   (Blank rows = not tested) PALPATION:                External Perineal Exam: intact                             Internal Pelvic Floor: tightness around the introitus  Patient confirms identification and approves PT to assess internal pelvic floor and treatment Yes  PELVIC MMT:   MMT eval 07/14/23 09/01/23  Vaginal 1/5 at first pushed therapist finger out but after therapist gave her verbal cues she was able to contract 2/5 with slight circular hug of therapist finger Started as a 1/5 then went to a 2/5  (Blank rows = not tested)         TODAY'S TREATMENT:   10/01/23 Exercises: Strengthening: 5 sit to stand 13 sec, 10 sec, 10 sec  2 minute walk test: 392 feet Supine SLR bringing hands to knees 15 x each Hold ball at 90 degrees shoulder flexion and alternate toe taps 20 x  Hip flexion isometrics but was easy.  Standing hip flexion with pelvic floor contraction 20 x Standing hip abduction with pelvic floor contraction 20 x  Therapeutic activities: Functional strengthening  activities: Discussed with patient on not flexing at the waist instead keeping the distance between the pubic bone and rib cage to reduce pressure on the bladder with brushing her teeth, picking up something and making her bed    09/01/23 Manual: Internal pelvic floor techniques: No emotional/communication barriers or cognitive limitation. Patient is motivated to learn. Patient understands and agrees with treatment goals and plan. PT explains patient will be examined in standing, sitting, and lying down to see how their muscles and joints work. When they are ready, they will be asked to remove their underwear so PT can examine their perineum. The patient is also given the option of providing their own chaperone as one is not provided in our facility. The patient also has the right and is explained the right to defer or refuse any part of the evaluation or treatment including the internal exam. With  the patient's consent, PT will use one gloved finger to gently assess the muscles of the pelvic floor, seeing how well it contracts and relaxes and if there is muscle symmetry. After, the patient will get dressed and PT and patient will discuss exam findings and plan of care. PT and patient discuss plan of care, schedule, attendance policy and HEP activities.  Going through the vaginal  canal with gloved finger working on the levator ani, urethra sphincter, pubovaginalis, introitus and urogenital diaphragm to elongate for a contraction Neuromuscular re-education: Core retraining: Dead bug with knees at 90/90 and then extend 10 x each side with assistance with coordination of limbs Pelvic floor contraction training: Therapist gloved finger in the vaginal canal working on a contraction with tapping to the muscle, a quick stretch, verbal cues to pick up a Jelly bean until she could isolate the contraction and not use her gluteals.  Reviewed with patient the urge to void behavioral technique to deter her urinary  urgency. She is working on it at home but still struggling with it Exercises: Strengthening: Sit to stand 5 x : 13 sec, 12 sec, 11 sec     08/25/23 Exercises: Strengthening: Hip flexion isometric 10 x each leg with lower abdominals engaged SLR with abdominal contraction 10 x 2 each side Supine dead bug 2 x 10 with abdominal contraction Sit to stand 5 x 3 Sitting knee extension with 3 dot band (yellow) 3 x 10  Standing hip flexion holding 3 seconds 2 x 10    07/28/23 Neuromuscular re-education: Down training: Educated patient on the urge to void behavioral technique to get to the bathroom in time Exercises: Strengthening: 2 minute walk test: 364 feet Bridge 10 x with verbal cues to lift her hips all the way.  Hip flexion isometric 10 x each leg with lower abdominals engaged Press ball between hands and bring over head with core engaged 10 x Standing hip flexion with pelvic floor contraction 10 x each side Standing hip abduction with pelvic floor contraction 10 x each side     PATIENT EDUCATION:  10/01/23 Education details: Access Code: [email or web address], education on abdominal massage and toileting Person educated: Patient Education method: Explanation, Demonstration, Tactile cues, Verbal cues, and Handouts Education comprehension: verbalized understanding, returned demonstration, verbal cues required, tactile cues required, and needs further education  HOME EXERCISE PROGRAM: 10/01/23 Access Code: [email or web address] URL: [email or web address] Date: 10/01/2023 Prepared by: Channing Pereyra  Exercises - Seated Pelvic Floor Contraction  - 4 x daily - 7 x weekly - 1 sets - 5 reps - Supine Diaphragmatic Breathing  - 1 x daily - 7 x weekly - 3 sets - 10 reps - Seated Diaphragmatic Breathing  - 1 x daily - 7 x weekly - 3 sets - 10 reps - Standing Marching  - 1 x daily - 3 x weekly - 1 sets - 10 reps - Standing Hip Abduction  - 1 x daily - 3 x weekly - 1 sets - 10 reps - Dead Bug  - 1 x  daily - 3 x weekly - 2 sets - 10 reps - Straight Leg Raise  - 1 x daily - 3 x weekly - 2 sets - 10 reps  Patient Education - Abdominal Massage for Constipation - Abdominal Massage for Constipation  ASSESSMENT:  CLINICAL IMPRESSION: Patient is a 78 y.o. female who was seen today for physical therapy  treatment for therapy after vaginal prolaspe surgery on 04/15/23. She then was diagnosed with left fallopian tube cancer.  She started chemotherapy on 06/04/23 and and last  treatment is next week. SABRA She will leak more urine after her chemotherapy treatments due to being weak. She is able to walk to the bathroom after the urge to void without leaking urine 3 weeks after chemotherapy treatment.  Patient is able to go from sit to stand in 10 sec. 2 minute walk test: 392 feet compared to 222 feet from initial eval.  Patient has increased strength of the hips. She is independent with her HEP.   OBJECTIVE IMPAIRMENTS: Abnormal gait, decreased activity tolerance, decreased balance, decreased coordination, difficulty walking, decreased strength, increased fascial restrictions, and increased muscle spasms.   ACTIVITY LIMITATIONS: lifting, bending, bed mobility, continence, and locomotion level  PARTICIPATION LIMITATIONS: cleaning, laundry, shopping, and community activity  PERSONAL FACTORS: Age, Time since onset of injury/illness/exacerbation, and 3+ comorbidities: Surgery: s/p Robotic assisted total laparoscopic hysterectomy, with bilateral salpingo-oophorectomy, sacrocolpopexy Lucita Lite Y), perineorrhaphy, cystoscopy on 04/15/23. Postoperative course complicated by diagnosis of incidental fallopian tube cancer- referred to GYN Onc/ Heme- Onc and planning for chemotherapy.right breast cancer estrogen positive; Osteoporosis; IBS; Hypothyroidism; Scoliosis;   are also affecting patient's functional outcome.   REHAB POTENTIAL: Good  CLINICAL DECISION MAKING: Evolving/moderate complexity  EVALUATION  COMPLEXITY: Moderate   GOALS: Goals reviewed with patient? Yes  SHORT TERM GOALS: Target date: 07/21/23  Patient independent with initial HEP for pelvic floor strength.  Baseline: Goal status:Met 07/14/23 2.  Patient able to perform diaphragmatic breathing to elongate the pelvic floor.  Baseline:  Goal status: Met 07/07/23  3.  Patient educated on urgency to void behavioral technique.  Baseline:  Goal status: Met 07/28/23   LONG TERM GOALS: Target date: 12/08/23  Patient independent with advanced HEP for core, pelvic floor and overall strength.  Baseline:  Goal status: Met 10/01/23  2.  Patient reports she wears 1 or less pads per days due to reduction of urinary leakage with walking.  Baseline: wears 4 pad for 3 weeks after chemotherapy treatment Goal status:not met 10/02/23  3.  Patient is able to walk to the bathroom with no urinary leakage due to using the behavioral techniques and increased in pelvic floor strength.  Baseline: improved by 50% due to increased urinary leakage for 3 weeks after chemotherapy treatment Goal status: not met 10/01/23  4.  Patient is able to walk >/= 462 feet in 2 minutes to reduce the risk of falling as she is walking to the bathroom.  Baseline: 392 feet Goal status: not met 10/01/23  5.  Sit to stand 5 times </= 12.6 seconds to assist getting up from chair safely to walk to the bathroom when she has urgency.  Baseline: 10 sec Goal status: Met 08/25/23   PLAN: Discharge to HEP this visit.   Channing Pereyra, PT 10/01/23 4:04 PM   PHYSICAL THERAPY DISCHARGE SUMMARY  Visits from Start of Care: 7  Current functional level related to goals / functional outcomes: See above. Patient wants to be discharged at this time due to her feeling that once she is done with chemotherapy her urinary leakage will be better. Her last chemotherapy treatment is next week. She would like a break from physical at this time.    Remaining deficits: See above.     Education / Equipment: HEP   Patient agrees to discharge. Patient goals were partially met. Patient is being discharged due to being pleased with the current functional level. Thank you for the referral.   Channing Pereyra, PT 10/01/23 4:04  PM

## 2023-10-05 MED FILL — Fosaprepitant Dimeglumine For IV Infusion 150 MG (Base Eq): INTRAVENOUS | Qty: 5 | Status: AC

## 2023-10-06 ENCOUNTER — Inpatient Hospital Stay

## 2023-10-06 ENCOUNTER — Encounter: Payer: Self-pay | Admitting: Hematology and Oncology

## 2023-10-06 ENCOUNTER — Inpatient Hospital Stay: Admitting: Hematology and Oncology

## 2023-10-06 ENCOUNTER — Inpatient Hospital Stay: Attending: Adult Health

## 2023-10-06 VITALS — BP 132/77 | HR 87 | Temp 98.1°F | Resp 16

## 2023-10-06 DIAGNOSIS — D61818 Other pancytopenia: Secondary | ICD-10-CM | POA: Insufficient documentation

## 2023-10-06 DIAGNOSIS — R739 Hyperglycemia, unspecified: Secondary | ICD-10-CM | POA: Diagnosis not present

## 2023-10-06 DIAGNOSIS — C5702 Malignant neoplasm of left fallopian tube: Secondary | ICD-10-CM

## 2023-10-06 DIAGNOSIS — T50905A Adverse effect of unspecified drugs, medicaments and biological substances, initial encounter: Secondary | ICD-10-CM

## 2023-10-06 DIAGNOSIS — Z5111 Encounter for antineoplastic chemotherapy: Secondary | ICD-10-CM | POA: Insufficient documentation

## 2023-10-06 LAB — CMP (CANCER CENTER ONLY)
ALT: 14 U/L (ref 0–44)
AST: 18 U/L (ref 15–41)
Albumin: 4.1 g/dL (ref 3.5–5.0)
Alkaline Phosphatase: 112 U/L (ref 38–126)
Anion gap: 9 (ref 5–15)
BUN: 13 mg/dL (ref 8–23)
CO2: 24 mmol/L (ref 22–32)
Calcium: 9.3 mg/dL (ref 8.9–10.3)
Chloride: 104 mmol/L (ref 98–111)
Creatinine: 0.77 mg/dL (ref 0.44–1.00)
GFR, Estimated: >60 mL/min (ref >60)
Glucose, Bld: 225 mg/dL — ABNORMAL HIGH (ref 70–99)
Potassium: 3.9 mmol/L (ref 3.5–5.1)
Sodium: 137 mmol/L (ref 135–145)
Total Bilirubin: 0.3 mg/dL (ref 0.0–1.2)
Total Protein: 6.8 g/dL (ref 6.5–8.1)

## 2023-10-06 LAB — CBC WITH DIFFERENTIAL (CANCER CENTER ONLY)
Abs Immature Granulocytes: 0.04 K/uL (ref 0.00–0.07)
Basophils Absolute: 0 K/uL (ref 0.0–0.1)
Basophils Relative: 0 %
Eosinophils Absolute: 0 K/uL (ref 0.0–0.5)
Eosinophils Relative: 0 %
HCT: 32.7 % — ABNORMAL LOW (ref 36.0–46.0)
Hemoglobin: 10.8 g/dL — ABNORMAL LOW (ref 12.0–15.0)
Immature Granulocytes: 1 %
Lymphocytes Relative: 13 %
Lymphs Abs: 0.4 K/uL — ABNORMAL LOW (ref 0.7–4.0)
MCH: 34.6 pg — ABNORMAL HIGH (ref 26.0–34.0)
MCHC: 33 g/dL (ref 30.0–36.0)
MCV: 104.8 fL — ABNORMAL HIGH (ref 80.0–100.0)
Monocytes Absolute: 0.1 K/uL (ref 0.1–1.0)
Monocytes Relative: 2 %
Neutro Abs: 2.7 K/uL (ref 1.7–7.7)
Neutrophils Relative %: 84 %
Platelet Count: 238 K/uL (ref 150–400)
RBC: 3.12 MIL/uL — ABNORMAL LOW (ref 3.87–5.11)
RDW: 17.2 % — ABNORMAL HIGH (ref 11.5–15.5)
WBC Count: 3.2 K/uL — ABNORMAL LOW (ref 4.0–10.5)
nRBC: 0 % (ref 0.0–0.2)

## 2023-10-06 MED ORDER — FAMOTIDINE IN NACL 20-0.9 MG/50ML-% IV SOLN
20.0000 mg | Freq: Once | INTRAVENOUS | Status: AC
  Administered 2023-10-06: 20 mg via INTRAVENOUS
  Filled 2023-10-06: qty 50

## 2023-10-06 MED ORDER — SODIUM CHLORIDE 0.9 % IV SOLN
150.0000 mg | Freq: Once | INTRAVENOUS | Status: AC
  Administered 2023-10-06: 150 mg via INTRAVENOUS
  Filled 2023-10-06: qty 150

## 2023-10-06 MED ORDER — SODIUM CHLORIDE 0.9 % IV SOLN: INTRAVENOUS | Status: DC

## 2023-10-06 MED ORDER — PALONOSETRON HCL INJECTION 0.25 MG/5ML
0.2500 mg | Freq: Once | INTRAVENOUS | Status: AC
  Administered 2023-10-06: 0.25 mg via INTRAVENOUS
  Filled 2023-10-06: qty 5

## 2023-10-06 MED ORDER — DEXAMETHASONE SODIUM PHOSPHATE 10 MG/ML IJ SOLN
10.0000 mg | Freq: Once | INTRAMUSCULAR | Status: AC
  Administered 2023-10-06: 10 mg via INTRAVENOUS
  Filled 2023-10-06: qty 1

## 2023-10-06 MED ORDER — SODIUM CHLORIDE 0.9 % IV SOLN
175.0000 mg/m2 | Freq: Once | INTRAVENOUS | Status: AC
  Administered 2023-10-06: 276 mg via INTRAVENOUS
  Filled 2023-10-06: qty 46

## 2023-10-06 MED ORDER — CETIRIZINE HCL 10 MG/ML IV SOLN
10.0000 mg | Freq: Once | INTRAVENOUS | Status: AC
  Administered 2023-10-06: 10 mg via INTRAVENOUS
  Filled 2023-10-06: qty 1

## 2023-10-06 MED ORDER — SODIUM CHLORIDE 0.9 % IV SOLN
339.5000 mg | Freq: Once | INTRAVENOUS | Status: AC
  Administered 2023-10-06: 340 mg via INTRAVENOUS
  Filled 2023-10-06: qty 34

## 2023-10-06 NOTE — Assessment & Plan Note (Addendum)
 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, stage II disease status post optimal debulking surgery  Final pathology high-grade serous, p53 mutated, Tumor marker was not helpful  She tolerated cycle 1 of chemotherapy well except for some excessive fatigue, bone aches and constipation which are self-limiting Cycle 2 of of treatment, she developed fatigue, loose stool, mild thrombocytopenia, steroid-induced hyperglycemia and mild weight loss With cycle 3 -4 of chemotherapy, it caused significant thrombocytopenia With cycle 5 of treatment, decision was made to increase the interval between treatment Her labs today shows slight pancytopenia but she is not symptomatic Will proceed with final treatment today Plan to repeat imaging study next month for further follow-up

## 2023-10-06 NOTE — Progress Notes (Signed)
 Per Dr. Lonn, start pre-med while still waiting for CMP result

## 2023-10-06 NOTE — Progress Notes (Signed)
 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, stage II disease status post optimal debulking surgery  Final pathology high-grade serous, p53 mutated, Tumor marker was not helpful  She tolerated cycle 1 of chemotherapy well except for some excessive fatigue, bone aches and constipation which are self-limiting Cycle 2 of of treatment, she developed fatigue, loose stool, mild thrombocytopenia, steroid-induced hyperglycemia and mild weight loss With cycle 3 -4 of chemotherapy, it caused significant thrombocytopenia With cycle 5 of treatment, decision was made to increase the interval between treatment Her labs today shows slight pancytopenia but she is not symptomatic Will proceed with final treatment today Plan to repeat imaging study next month for further follow-up  Pancytopenia, acquired (HCC) We will proceed with treatment today She is not symptomatic Drug-induced hyperglycemia Due to steroids Observe closely  Orders Placed This Encounter  Procedures   CT CHEST ABDOMEN PELVIS W CONTRAST    Standing Status:   Future    Expected Date:   11/05/2023    Expiration Date:   10/05/2024    If indicated for the ordered procedure, I authorize the administration of contrast media per Radiology protocol:   Yes    Does the patient have a contrast media/X-ray dye allergy?:   No    Preferred imaging location?:   [facility]    If indicated for the ordered procedure, I authorize the administration of oral contrast media per Radiology protocol:   No    Reason for no oral contrast::   No need oral contrast   CBC with Differential/Platelet    Standing Status:   Standing    Number of Occurrences:   22    Expiration Date:    10/05/2024   Comprehensive metabolic panel with GFR    Standing Status:   Standing    Number of Occurrences:   33    Expiration Date:   10/05/2024   CA 125    Standing Status:   Standing    Number of Occurrences:   11    Expiration Date:   10/05/2024     Jacqueline Bedford, MD  INTERVAL HISTORY: she returns for treatment follow-up Complications related to previous cycle of chemotherapy included pancytopenia, and elevated blood sugar She denies nausea, or neuropathy She had mild fatigue usually around day 3 of treatment We discussed timing of next imaging  PHYSICAL EXAMINATION: ECOG PERFORMANCE STATUS: 1 - Symptomatic but completely ambulatory  Lab Results  Component Value Date   CAN125 17.3 05/11/2023      Latest Ref Rng & Units 10/06/2023    9:13 AM 09/08/2023    7:46 AM 08/17/2023    9:59 AM  CBC  WBC 4.0 - 10.5 K/uL 3.2  5.0  4.2   Hemoglobin 12.0 - 15.0 g/dL 89.1  88.2  87.4   Hematocrit 36.0 - 46.0 % 32.7  34.5  37.5   Platelets 150 - 400 K/uL 238  105  316       Chemistry      Component Value Date/Time   NA 137 10/06/2023 0913   NA 139 04/13/2023 1133   K 3.9 10/06/2023 0913   CL 104 10/06/2023 0913   CO2 24 10/06/2023 0913   BUN 13 10/06/2023 0913   BUN 11  04/13/2023 1133   CREATININE 0.77 10/06/2023 0913      Component Value Date/Time   CALCIUM  9.3 10/06/2023 0913   ALKPHOS 112 10/06/2023 0913   AST 18 10/06/2023 0913   ALT 14 10/06/2023 0913   BILITOT 0.3 10/06/2023 0913       There were no vitals filed for this visit. There were no vitals filed for this visit. Other relevant data reviewed during this visit included CBC, CMP

## 2023-10-06 NOTE — Assessment & Plan Note (Addendum)
 We will proceed with treatment today She is not symptomatic

## 2023-10-06 NOTE — Assessment & Plan Note (Addendum)
 Due to steroids Observe closely

## 2023-10-15 ENCOUNTER — Encounter: Payer: Self-pay | Admitting: Physical Therapy

## 2023-11-05 ENCOUNTER — Inpatient Hospital Stay: Attending: Adult Health

## 2023-11-05 ENCOUNTER — Ambulatory Visit (HOSPITAL_COMMUNITY)
Admission: RE | Admit: 2023-11-05 | Discharge: 2023-11-05 | Disposition: A | Discharge status: Home / Self Care | Source: Ambulatory Visit | Attending: Hematology and Oncology | Admitting: Hematology and Oncology

## 2023-11-05 DIAGNOSIS — R918 Other nonspecific abnormal finding of lung field: Secondary | ICD-10-CM | POA: Insufficient documentation

## 2023-11-05 DIAGNOSIS — Z854A Personal history of malignant neoplasm of fallopian tube(s): Secondary | ICD-10-CM | POA: Insufficient documentation

## 2023-11-05 DIAGNOSIS — Z853 Personal history of malignant neoplasm of breast: Secondary | ICD-10-CM | POA: Insufficient documentation

## 2023-11-05 DIAGNOSIS — C5702 Malignant neoplasm of left fallopian tube: Secondary | ICD-10-CM | POA: Insufficient documentation

## 2023-11-05 DIAGNOSIS — D61818 Other pancytopenia: Secondary | ICD-10-CM | POA: Insufficient documentation

## 2023-11-05 LAB — CBC WITH DIFFERENTIAL/PLATELET
Abs Immature Granulocytes: 0.01 K/uL (ref 0.00–0.07)
Basophils Absolute: 0 K/uL (ref 0.0–0.1)
Basophils Relative: 0 %
Eosinophils Absolute: 0.1 K/uL (ref 0.0–0.5)
Eosinophils Relative: 2 %
HCT: 33.9 % — ABNORMAL LOW (ref 36.0–46.0)
Hemoglobin: 11.4 g/dL — ABNORMAL LOW (ref 12.0–15.0)
Immature Granulocytes: 0 %
Lymphocytes Relative: 20 %
Lymphs Abs: 0.9 K/uL (ref 0.7–4.0)
MCH: 35.4 pg — ABNORMAL HIGH (ref 26.0–34.0)
MCHC: 33.6 g/dL (ref 30.0–36.0)
MCV: 105.3 fL — ABNORMAL HIGH (ref 80.0–100.0)
Monocytes Absolute: 0.6 K/uL (ref 0.1–1.0)
Monocytes Relative: 13 %
Neutro Abs: 3 K/uL (ref 1.7–7.7)
Neutrophils Relative %: 65 %
Platelets: 112 K/uL — ABNORMAL LOW (ref 150–400)
RBC: 3.22 MIL/uL — ABNORMAL LOW (ref 3.87–5.11)
RDW: 14.9 % (ref 11.5–15.5)
WBC: 4.6 K/uL (ref 4.0–10.5)
nRBC: 0 % (ref 0.0–0.2)

## 2023-11-05 LAB — COMPREHENSIVE METABOLIC PANEL WITH GFR
ALT: 13 U/L (ref 0–44)
AST: 17 U/L (ref 15–41)
Albumin: 4 g/dL (ref 3.5–5.0)
Alkaline Phosphatase: 93 U/L (ref 38–126)
Anion gap: 6 (ref 5–15)
BUN: 12 mg/dL (ref 8–23)
CO2: 29 mmol/L (ref 22–32)
Calcium: 9.6 mg/dL (ref 8.9–10.3)
Chloride: 107 mmol/L (ref 98–111)
Creatinine, Ser: 0.69 mg/dL (ref 0.44–1.00)
GFR, Estimated: >60 mL/min (ref >60)
Glucose, Bld: 100 mg/dL — ABNORMAL HIGH (ref 70–99)
Potassium: 3.9 mmol/L (ref 3.5–5.1)
Sodium: 142 mmol/L (ref 135–145)
Total Bilirubin: 0.3 mg/dL (ref 0.0–1.2)
Total Protein: 6.6 g/dL (ref 6.5–8.1)

## 2023-11-05 MED ORDER — IOHEXOL 300 MG/ML  SOLN
100.0000 mL | Freq: Once | INTRAMUSCULAR | Status: AC | PRN
  Administered 2023-11-05: 100 mL via INTRAVENOUS

## 2023-11-05 MED ORDER — HEPARIN SOD (PORK) LOCK FLUSH 100 UNIT/ML IV SOLN
500.0000 [IU] | Freq: Once | INTRAVENOUS | Status: AC
  Administered 2023-11-05: 500 [IU] via INTRAVENOUS

## 2023-11-06 LAB — CA 125: Cancer Antigen (CA) 125: 14.9 U/mL (ref 0.0–38.1)

## 2023-11-12 ENCOUNTER — Inpatient Hospital Stay (HOSPITAL_BASED_OUTPATIENT_CLINIC_OR_DEPARTMENT_OTHER): Admitting: Hematology and Oncology

## 2023-11-12 ENCOUNTER — Encounter: Payer: Self-pay | Admitting: Hematology and Oncology

## 2023-11-12 VITALS — BP 136/79 | HR 90 | Temp 98.4°F | Resp 18 | Ht 63.0 in | Wt 129.0 lb

## 2023-11-12 DIAGNOSIS — Z170 Estrogen receptor positive status [ER+]: Secondary | ICD-10-CM

## 2023-11-12 DIAGNOSIS — Z854A Personal history of malignant neoplasm of fallopian tube(s): Secondary | ICD-10-CM | POA: Diagnosis not present

## 2023-11-12 DIAGNOSIS — D61818 Other pancytopenia: Secondary | ICD-10-CM | POA: Diagnosis not present

## 2023-11-12 DIAGNOSIS — R918 Other nonspecific abnormal finding of lung field: Secondary | ICD-10-CM

## 2023-11-12 DIAGNOSIS — C50811 Malignant neoplasm of overlapping sites of right female breast: Secondary | ICD-10-CM | POA: Diagnosis not present

## 2023-11-12 DIAGNOSIS — C5702 Malignant neoplasm of left fallopian tube: Secondary | ICD-10-CM

## 2023-11-12 NOTE — Assessment & Plan Note (Addendum)
 I plan to repeat imaging study again in 6 months to evaluate the lung nodules given multiple cancer history

## 2023-11-12 NOTE — Progress Notes (Signed)
 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, stage II disease status post optimal debulking surgery  Final pathology high-grade serous, p53 mutated, Tumor marker was not helpful  She completed 6 cycles of combination of carboplatin  and paclitaxel  in September 2025, treatment complicated by fatigue, bone aches and pancytopenia She is still recovering from side effects of treatment I reviewed recent blood work and imaging studies with the patient which showed no evidence of disease I reminded the patient to contact her gynecologist for pelvic exam in 3 months I plan to see her again in 6 months with repeat blood work and imaging study We discussed port maintenance and the patient would not want to remove the port and I placed order Pancytopenia, acquired (HCC) She is not symptomatic I anticipate improvement/resolution in a few months Malignant neoplasm of overlapping sites of right breast in female, estrogen receptor positive (HCC) She will continue follow-up at cancer survivorship clinic for history of breast cancer Multiple lung nodules on CT I plan to repeat imaging study again in 6 months to evaluate the lung nodules given multiple cancer history  Orders Placed This Encounter  Procedures   IR REMOVAL TUN ACCESS W/ PORT W/O FL MOD SED    Standing Status:   Future    Expected Date:   11/19/2023    Expiration Date:   11/11/2024    Reason for exam::   no need port for chemo    Preferred Imaging Location?:   [facility]   CT CHEST ABDOMEN PELVIS W CONTRAST    Standing Status:   Future    Expected Date:   05/12/2024    Expiration Date:   11/11/2024    If indicated for the ordered procedure, I authorize the  administration of contrast media per Radiology protocol:   Yes    Does the patient have a contrast media/X-ray dye allergy?:   No    Preferred imaging location?:   [facility]    If indicated for the ordered procedure, I authorize the administration of oral contrast media per Radiology protocol:   No    Reason for no oral contrast::   no need oral contrast   CBC with Differential/Platelet    Standing Status:   Standing    Number of Occurrences:   22    Expiration Date:   11/11/2024   Comprehensive metabolic panel with GFR    Standing Status:   Standing    Number of Occurrences:   33    Expiration Date:   11/11/2024   CA 125    Standing Status:   Standing    Number of Occurrences:   11    Expiration Date:   11/11/2024     Jacqueline Bedford, MD  INTERVAL HISTORY: she returns for surveillance follow-up after completion of treatment She is doing well She is recovering from treatment No recent infection or bleeding Denies abdominal discomfort I reviewed blood work and imaging studies with the patient   PHYSICAL EXAMINATION: ECOG PERFORMANCE STATUS: 1 - Symptomatic but completely ambulatory  Vitals:   11/12/23 0921  BP: 136/79  Pulse: 90  Resp: 18  Temp: 98.4 F (36.9 C)  SpO2: 95%   Filed Weights   11/12/23 0921  Weight: 129  lb (58.5 kg)    Relevant data reviewed during this visit included CBC, CMP, CA125, CT imaging for October 2025

## 2023-11-12 NOTE — Assessment & Plan Note (Addendum)
 She is not symptomatic I anticipate improvement/resolution in a few months

## 2023-11-12 NOTE — Assessment & Plan Note (Addendum)
 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, stage II disease status post optimal debulking surgery  Final pathology high-grade serous, p53 mutated, Tumor marker was not helpful  She completed 6 cycles of combination of carboplatin  and paclitaxel  in September 2025, treatment complicated by fatigue, bone aches and pancytopenia She is still recovering from side effects of treatment I reviewed recent blood work and imaging studies with the patient which showed no evidence of disease I reminded the patient to contact her gynecologist for pelvic exam in 3 months I plan to see her again in 6 months with repeat blood work and imaging study We discussed port maintenance and the patient would not want to remove the port and I placed order

## 2023-11-12 NOTE — Assessment & Plan Note (Addendum)
 She will continue follow-up at cancer survivorship clinic for history of breast cancer

## 2023-11-20 ENCOUNTER — Other Ambulatory Visit: Payer: Self-pay

## 2023-11-23 ENCOUNTER — Ambulatory Visit (HOSPITAL_COMMUNITY)
Admission: RE | Admit: 2023-11-23 | Discharge: 2023-11-23 | Disposition: A | Source: Ambulatory Visit | Attending: Hematology and Oncology

## 2023-11-23 DIAGNOSIS — Z452 Encounter for adjustment and management of vascular access device: Secondary | ICD-10-CM | POA: Diagnosis present

## 2023-11-23 DIAGNOSIS — C5702 Malignant neoplasm of left fallopian tube: Secondary | ICD-10-CM | POA: Insufficient documentation

## 2023-11-23 MED ORDER — LIDOCAINE-EPINEPHRINE 1 %-1:100000 IJ SOLN
20.0000 mL | Freq: Once | INTRAMUSCULAR | Status: AC
  Administered 2023-11-23: 10 mL via INTRADERMAL

## 2023-11-23 MED ORDER — LIDOCAINE-EPINEPHRINE 1 %-1:100000 IJ SOLN
INTRAMUSCULAR | Status: AC
  Filled 2023-11-23: qty 1

## 2023-11-23 NOTE — Procedures (Signed)
 Vascular and Interventional Radiology Procedure Note  Patient: Jacqueline Ortiz DOB: [birth year] Medical Record Number: [id number] Note Date/Time: 11/23/23 9:43 AM   Performing Physician: Thom Hall, MD Assistant(s): None  Diagnosis: Rx completion. Hx GYN CA  Procedure: PORT REMOVAL  Anesthesia: Local Anesthetic Complications: None Estimated Blood Loss: Minimal Specimens:  None  Findings:  Successful removal of a right-sided venous port. Primary incision closure. Dermabond at skin.  See detailed procedure note with images in PACS. The patient tolerated the procedure well without incident or complication and was returned to Recovery in stable condition.    Thom Hall, MD [facility] Radiology   Pager. [phone] Clinic. [phone]

## 2023-11-30 ENCOUNTER — Ambulatory Visit: Admitting: Obstetrics and Gynecology

## 2023-11-30 VITALS — BP 126/81 | HR 97

## 2023-11-30 DIAGNOSIS — N3281 Overactive bladder: Secondary | ICD-10-CM | POA: Diagnosis not present

## 2023-11-30 NOTE — Progress Notes (Signed)
 [facility] Return Visit  SUBJECTIVE  History of Present Illness: Jacqueline Ortiz is a 78 y.o. female seen in follow-up for follow up after pelvic floor PT. She did 7 sessions of PT and then was finishing her chemo. Patient states it has been a lot but she is starting to feel better overall.   Patient reports her bowels are regular with no leakage.   She states she does have urine leakage and changes 3 pads during the day and 1 pad at night but this is manageable to her.   States she in getting her energy back and has been doing more of her PT exercises at home and has seen improvement in her symptoms.   Denies pain or issues during intercourse.    Past Medical History: Patient  has a past medical history of Complete uterovaginal prolapse, Diverticulosis of colon, Dry eyes, bilateral (2000), Duodenal diverticulum, Dyslipidemia (2008), Eczema (1993), GAD (generalized anxiety disorder), Hemorrhoids (2003), History of adenomatous polyp of colon, History of hiatal hernia, History of kidney stones, Hyperlipidemia (2008), Hypertension (2021), Hypothyroidism, IBS (irritable bowel syndrome), Malignant neoplasm of overlapping sites of right breast in female, estrogen receptor positive (HCC) (02/2021), OA (osteoarthritis), Osteoporosis (2003), Panic disorder, Prolapse of vaginal walls, S/P dilatation of esophageal stricture (2008), 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   Past Surgical History: She  has a past surgical history that includes Tubal ligation (Bilateral, 1977); Colonoscopy with propofol  (09/04/2020); Cesarean section (1976); Dilation and curettage of uterus; Patella fracture surgery (Right, 1996); Tonsillectomy (1953); Esophagogastroduodenoscopy (N/A, 10/21/2015); Robotic assisted laparoscopic nissen fundoplication (02/06/2016); Breast lumpectomy  with radioactive seed localization (Right, 05/01/2021); Sentinel node biopsy (Right, 05/01/2021); Larynx surgery (1993); Robotic assisted total hysterectomy with bilateral salpingo oophorectomy (N/A, 04/15/2023); Robotic assisted laparoscopic sacrocolpopexy (N/A, 04/15/2023); Perineoplasty (N/A, 04/15/2023); Cystoscopy (N/A, 04/15/2023); IR IMAGING GUIDED PORT INSERTION (05/21/2023); and IR REMOVAL TUN ACCESS W/ PORT W/O FL MOD SED (11/23/2023).   Medications: She has a current medication list which includes the following prescription(s): atorvastatin , calcium -magnesium -vitamin d, escitalopram, levothyroxine, loratadine, sodium chloride , and triamterene -hydrochlorothiazide .   Allergies: Patient has no known allergies.   Social History: Patient  reports that she has never smoked. She has never used smokeless tobacco. She reports that she does not drink alcohol and does not use drugs.     OBJECTIVE     Physical Exam: Vitals:   11/30/23 1303  BP: 126/81  Pulse: 97   Gen: No apparent distress, A&O x 3.  Detailed Urogynecologic Evaluation:  Deferred.    ASSESSMENT AND PLAN    Jacqueline Ortiz is a 78 y.o. with:  1. Overactive bladder    Patient defers medication at this time and states she will continue to do self led PT. Will follow up with patient in 1 year or sooner if needed. She reports she will call if any UTI, vaginal pain, or bladder concerns   Patient to follow up in 1 year or sooner if needed.    Yamaris Cummings G Scotlyn Mccranie, NP

## 2024-01-14 ENCOUNTER — Other Ambulatory Visit: Payer: Self-pay | Admitting: Adult Health

## 2024-01-14 DIAGNOSIS — Z853 Personal history of malignant neoplasm of breast: Secondary | ICD-10-CM

## 2024-02-19 ENCOUNTER — Ambulatory Visit
Admission: RE | Admit: 2024-02-19 | Discharge: 2024-02-19 | Disposition: A | Source: Ambulatory Visit | Attending: Adult Health | Admitting: Adult Health

## 2024-02-19 DIAGNOSIS — Z853 Personal history of malignant neoplasm of breast: Secondary | ICD-10-CM

## 2024-03-28 ENCOUNTER — Encounter: Payer: Medicare Other | Admitting: Adult Health

## 2024-05-05 ENCOUNTER — Inpatient Hospital Stay

## 2024-05-12 ENCOUNTER — Inpatient Hospital Stay: Admitting: Hematology and Oncology
